# Patient Record
Sex: Female | Born: 1950 | Race: Black or African American | Hispanic: No | Smoking: Never smoker
Health system: Southern US, Community
[De-identification: ages and names within clinical notes are randomized; demographics above are authoritative.]

## PROBLEM LIST (undated history)

## (undated) DIAGNOSIS — T7840XA Allergy, unspecified, initial encounter: Secondary | ICD-10-CM

## (undated) DIAGNOSIS — M1990 Unspecified osteoarthritis, unspecified site: Secondary | ICD-10-CM

## (undated) DIAGNOSIS — I10 Essential (primary) hypertension: Secondary | ICD-10-CM

## (undated) DIAGNOSIS — C801 Malignant (primary) neoplasm, unspecified: Secondary | ICD-10-CM

## (undated) DIAGNOSIS — J329 Chronic sinusitis, unspecified: Secondary | ICD-10-CM

## (undated) DIAGNOSIS — J310 Chronic rhinitis: Secondary | ICD-10-CM

## (undated) HISTORY — DX: Allergy, unspecified, initial encounter: T78.40XA

## (undated) HISTORY — DX: Essential (primary) hypertension: I10

## (undated) HISTORY — DX: Unspecified osteoarthritis, unspecified site: M19.90

## (undated) HISTORY — DX: Chronic rhinitis: J31.0

## (undated) HISTORY — DX: Chronic sinusitis, unspecified: J32.9

## (undated) HISTORY — PX: ABDOMINAL HYSTERECTOMY: SHX81

## (undated) HISTORY — DX: Morbid (severe) obesity due to excess calories: E66.01

## (undated) HISTORY — PX: TUBAL LIGATION: SHX77

## (undated) HISTORY — DX: Malignant (primary) neoplasm, unspecified: C80.1

---

## 1976-02-16 HISTORY — PX: OTHER SURGICAL HISTORY: SHX169

## 1987-02-16 HISTORY — PX: OTHER SURGICAL HISTORY: SHX169

## 2005-02-15 HISTORY — PX: TOTAL KNEE ARTHROPLASTY: SHX125

## 2006-02-22 LAB — HM COLONOSCOPY: HM Colonoscopy: NORMAL

## 2006-05-10 ENCOUNTER — Ambulatory Visit: Payer: Self-pay | Admitting: Family Medicine

## 2006-05-12 ENCOUNTER — Ambulatory Visit (HOSPITAL_COMMUNITY): Admission: RE | Admit: 2006-05-12 | Discharge: 2006-05-12 | Payer: Self-pay | Admitting: Family Medicine

## 2006-05-23 ENCOUNTER — Ambulatory Visit: Admission: RE | Admit: 2006-05-23 | Discharge: 2006-05-23 | Payer: Self-pay | Admitting: Family Medicine

## 2006-06-03 ENCOUNTER — Ambulatory Visit: Payer: Self-pay | Admitting: Pulmonary Disease

## 2006-06-21 ENCOUNTER — Ambulatory Visit: Payer: Self-pay | Admitting: Family Medicine

## 2006-07-20 ENCOUNTER — Encounter: Payer: Self-pay | Admitting: Family Medicine

## 2006-07-20 LAB — CONVERTED CEMR LAB
ALT: 17 units/L (ref 0–35)
AST: 19 units/L (ref 0–37)
Albumin: 4.3 g/dL (ref 3.5–5.2)
Alkaline Phosphatase: 127 units/L — ABNORMAL HIGH (ref 39–117)
BUN: 12 mg/dL (ref 6–23)
Basophils Absolute: 0 10*3/uL (ref 0.0–0.1)
Basophils Relative: 0 % (ref 0–1)
Bilirubin, Direct: 0.1 mg/dL (ref 0.0–0.3)
Blood Glucose, Fasting: 91 mg/dL
CO2: 27 meq/L (ref 19–32)
Calcium: 9 mg/dL (ref 8.4–10.5)
Chloride: 105 meq/L (ref 96–112)
Cholesterol: 141 mg/dL (ref 0–200)
Creatinine, Ser: 0.74 mg/dL (ref 0.40–1.20)
Eosinophils Absolute: 0.1 10*3/uL (ref 0.0–0.7)
Eosinophils Relative: 1 % (ref 0–5)
Glucose, Bld: 91 mg/dL (ref 70–99)
HCT: 39.9 % (ref 36.0–46.0)
HDL: 38 mg/dL — ABNORMAL LOW (ref >39)
Hemoglobin: 12.7 g/dL (ref 12.0–15.0)
Indirect Bilirubin: 0.4 mg/dL (ref 0.0–0.9)
LDL Cholesterol: 77 mg/dL (ref 0–99)
Lymphocytes Relative: 40 % (ref 12–46)
Lymphs Abs: 2.4 10*3/uL (ref 0.7–3.3)
MCHC: 31.8 g/dL (ref 30.0–36.0)
MCV: 87.9 fL (ref 78.0–100.0)
Monocytes Absolute: 0.4 10*3/uL (ref 0.2–0.7)
Monocytes Relative: 7 % (ref 3–11)
Neutro Abs: 3.1 10*3/uL (ref 1.7–7.7)
Neutrophils Relative %: 51 % (ref 43–77)
Platelets: 267 10*3/uL (ref 150–400)
Potassium: 4.1 meq/L (ref 3.5–5.3)
RBC count: 4.54 10*6/uL
RBC: 4.54 M/uL (ref 3.87–5.11)
RDW: 14.8 % — ABNORMAL HIGH (ref 11.5–14.0)
Sodium: 140 meq/L (ref 135–145)
TSH: 2.661 microintl units/mL
TSH: 2.661 microintl units/mL (ref 0.350–5.50)
Total Bilirubin: 0.5 mg/dL (ref 0.3–1.2)
Total CHOL/HDL Ratio: 3.7
Total Protein: 7.5 g/dL (ref 6.0–8.3)
Triglycerides: 130 mg/dL (ref <150)
VLDL: 26 mg/dL (ref 0–40)
WBC, blood: 6 10*3/uL
WBC: 6 10*3/uL (ref 4.0–10.5)

## 2006-09-21 ENCOUNTER — Ambulatory Visit: Payer: Self-pay | Admitting: Family Medicine

## 2006-11-23 ENCOUNTER — Ambulatory Visit: Payer: Self-pay | Admitting: Family Medicine

## 2007-01-25 ENCOUNTER — Ambulatory Visit: Payer: Self-pay | Admitting: Family Medicine

## 2007-01-26 ENCOUNTER — Ambulatory Visit (HOSPITAL_COMMUNITY): Admission: RE | Admit: 2007-01-26 | Discharge: 2007-01-26 | Payer: Self-pay | Admitting: Family Medicine

## 2007-02-16 DIAGNOSIS — I10 Essential (primary) hypertension: Secondary | ICD-10-CM

## 2007-02-16 HISTORY — DX: Essential (primary) hypertension: I10

## 2007-02-24 ENCOUNTER — Encounter: Payer: Self-pay | Admitting: Family Medicine

## 2007-03-08 ENCOUNTER — Other Ambulatory Visit: Admission: RE | Admit: 2007-03-08 | Discharge: 2007-03-08 | Payer: Self-pay | Admitting: Family Medicine

## 2007-03-08 ENCOUNTER — Ambulatory Visit: Payer: Self-pay | Admitting: Family Medicine

## 2007-03-08 ENCOUNTER — Encounter: Payer: Self-pay | Admitting: Family Medicine

## 2007-03-08 LAB — CONVERTED CEMR LAB
Candida species: NEGATIVE
Gardnerella vaginalis: NEGATIVE
Pap Smear: NORMAL
Trichomonal Vaginitis: NEGATIVE

## 2007-07-19 ENCOUNTER — Ambulatory Visit: Payer: Self-pay | Admitting: Family Medicine

## 2007-09-13 ENCOUNTER — Ambulatory Visit: Payer: Self-pay | Admitting: Family Medicine

## 2007-09-13 ENCOUNTER — Encounter: Payer: Self-pay | Admitting: Family Medicine

## 2007-09-13 DIAGNOSIS — M159 Polyosteoarthritis, unspecified: Secondary | ICD-10-CM

## 2007-09-13 DIAGNOSIS — I10 Essential (primary) hypertension: Secondary | ICD-10-CM

## 2007-09-29 ENCOUNTER — Encounter: Payer: Self-pay | Admitting: Family Medicine

## 2007-09-29 LAB — CONVERTED CEMR LAB
BUN: 14 mg/dL (ref 6–23)
Basophils Absolute: 0 10*3/uL (ref 0.0–0.1)
Basophils Relative: 0 % (ref 0–1)
CO2: 29 meq/L (ref 19–32)
Calcium: 9.1 mg/dL (ref 8.4–10.5)
Chloride: 102 meq/L (ref 96–112)
Cholesterol: 141 mg/dL (ref 0–200)
Creatinine, Ser: 0.75 mg/dL (ref 0.40–1.20)
Eosinophils Absolute: 0.1 10*3/uL (ref 0.0–0.7)
Eosinophils Relative: 1 % (ref 0–5)
Glucose, Bld: 92 mg/dL (ref 70–99)
HCT: 40.5 % (ref 36.0–46.0)
HDL: 40 mg/dL (ref >39)
Hemoglobin: 12.9 g/dL (ref 12.0–15.0)
LDL Cholesterol: 83 mg/dL (ref 0–99)
Lymphocytes Relative: 32 % (ref 12–46)
Lymphs Abs: 2 10*3/uL (ref 0.7–4.0)
MCHC: 31.9 g/dL (ref 30.0–36.0)
MCV: 86.2 fL (ref 78.0–100.0)
Magnesium: 2 mg/dL (ref 1.5–2.5)
Monocytes Absolute: 0.6 10*3/uL (ref 0.1–1.0)
Monocytes Relative: 10 % (ref 3–12)
Neutro Abs: 3.6 10*3/uL (ref 1.7–7.7)
Neutrophils Relative %: 57 % (ref 43–77)
Platelets: 278 10*3/uL (ref 150–400)
Potassium: 4.3 meq/L (ref 3.5–5.3)
RBC: 4.7 M/uL (ref 3.87–5.11)
RDW: 15.5 % (ref 11.5–15.5)
Sodium: 142 meq/L (ref 135–145)
TSH: 1.975 microintl units/mL (ref 0.350–4.50)
Total CHOL/HDL Ratio: 3.5
Triglycerides: 89 mg/dL (ref <150)
VLDL: 18 mg/dL (ref 0–40)
WBC: 6.3 10*3/uL (ref 4.0–10.5)

## 2007-11-16 ENCOUNTER — Ambulatory Visit: Payer: Self-pay | Admitting: Family Medicine

## 2007-11-16 DIAGNOSIS — R109 Unspecified abdominal pain: Secondary | ICD-10-CM | POA: Insufficient documentation

## 2007-11-21 ENCOUNTER — Ambulatory Visit (HOSPITAL_COMMUNITY): Admission: RE | Admit: 2007-11-21 | Discharge: 2007-11-21 | Payer: Self-pay | Admitting: Family Medicine

## 2008-02-05 ENCOUNTER — Encounter: Payer: Self-pay | Admitting: Family Medicine

## 2008-07-31 ENCOUNTER — Telehealth (INDEPENDENT_AMBULATORY_CARE_PROVIDER_SITE_OTHER): Payer: Self-pay | Admitting: *Deleted

## 2008-08-01 ENCOUNTER — Encounter: Payer: Self-pay | Admitting: Family Medicine

## 2008-08-08 ENCOUNTER — Ambulatory Visit: Payer: Self-pay | Admitting: Family Medicine

## 2008-08-13 ENCOUNTER — Ambulatory Visit (HOSPITAL_COMMUNITY): Admission: RE | Admit: 2008-08-13 | Discharge: 2008-08-13 | Payer: Self-pay | Admitting: Family Medicine

## 2008-08-18 DIAGNOSIS — J309 Allergic rhinitis, unspecified: Secondary | ICD-10-CM | POA: Insufficient documentation

## 2008-09-13 ENCOUNTER — Telehealth: Payer: Self-pay | Admitting: Family Medicine

## 2008-09-13 ENCOUNTER — Ambulatory Visit: Payer: Self-pay | Admitting: Family Medicine

## 2008-09-13 DIAGNOSIS — M25569 Pain in unspecified knee: Secondary | ICD-10-CM

## 2008-09-13 HISTORY — DX: Pain in unspecified knee: M25.569

## 2008-09-13 LAB — CONVERTED CEMR LAB
BUN: 18 mg/dL (ref 6–23)
Basophils Absolute: 0 10*3/uL (ref 0.0–0.1)
Basophils Relative: 0 % (ref 0–1)
CO2: 28 meq/L (ref 19–32)
Calcium: 9.3 mg/dL (ref 8.4–10.5)
Chloride: 102 meq/L (ref 96–112)
Cholesterol: 146 mg/dL (ref 0–200)
Creatinine, Ser: 0.81 mg/dL (ref 0.40–1.20)
Eosinophils Absolute: 0.1 10*3/uL (ref 0.0–0.7)
Eosinophils Relative: 1 % (ref 0–5)
Glucose, Bld: 103 mg/dL — ABNORMAL HIGH (ref 70–99)
HCT: 41.1 % (ref 36.0–46.0)
HDL: 36 mg/dL — ABNORMAL LOW (ref >39)
Hemoglobin: 13 g/dL (ref 12.0–15.0)
LDL Cholesterol: 80 mg/dL (ref 0–99)
Lymphocytes Relative: 36 % (ref 12–46)
Lymphs Abs: 2.7 10*3/uL (ref 0.7–4.0)
MCHC: 31.6 g/dL (ref 30.0–36.0)
MCV: 88.2 fL (ref 78.0–100.0)
Monocytes Absolute: 0.6 10*3/uL (ref 0.1–1.0)
Monocytes Relative: 8 % (ref 3–12)
Neutro Abs: 4.1 10*3/uL (ref 1.7–7.7)
Neutrophils Relative %: 55 % (ref 43–77)
Platelets: 266 10*3/uL (ref 150–400)
Potassium: 4.2 meq/L (ref 3.5–5.3)
RBC: 4.66 M/uL (ref 3.87–5.11)
RDW: 14.6 % (ref 11.5–15.5)
Sodium: 142 meq/L (ref 135–145)
TSH: 2.278 microintl units/mL (ref 0.350–4.500)
Total CHOL/HDL Ratio: 4.1
Triglycerides: 148 mg/dL (ref <150)
VLDL: 30 mg/dL (ref 0–40)
WBC: 7.4 10*3/uL (ref 4.0–10.5)

## 2008-10-15 ENCOUNTER — Telehealth: Payer: Self-pay | Admitting: Family Medicine

## 2008-11-13 ENCOUNTER — Other Ambulatory Visit: Admission: RE | Admit: 2008-11-13 | Discharge: 2008-11-13 | Payer: Self-pay | Admitting: Family Medicine

## 2008-11-13 ENCOUNTER — Ambulatory Visit: Payer: Self-pay | Admitting: Family Medicine

## 2008-11-13 ENCOUNTER — Encounter: Payer: Self-pay | Admitting: Family Medicine

## 2008-11-13 DIAGNOSIS — H547 Unspecified visual loss: Secondary | ICD-10-CM | POA: Insufficient documentation

## 2008-11-13 LAB — CONVERTED CEMR LAB: OCCULT 1: NEGATIVE

## 2008-11-18 ENCOUNTER — Ambulatory Visit (HOSPITAL_COMMUNITY): Admission: RE | Admit: 2008-11-18 | Discharge: 2008-11-18 | Payer: Self-pay | Admitting: Family Medicine

## 2009-01-17 ENCOUNTER — Telehealth: Payer: Self-pay | Admitting: Family Medicine

## 2009-01-17 ENCOUNTER — Ambulatory Visit: Payer: Self-pay | Admitting: Family Medicine

## 2009-02-13 ENCOUNTER — Ambulatory Visit: Payer: Self-pay | Admitting: Family Medicine

## 2009-02-18 ENCOUNTER — Telehealth: Payer: Self-pay | Admitting: Family Medicine

## 2009-02-20 ENCOUNTER — Telehealth: Payer: Self-pay | Admitting: Family Medicine

## 2009-04-25 ENCOUNTER — Ambulatory Visit: Payer: Self-pay | Admitting: Family Medicine

## 2009-04-28 LAB — CONVERTED CEMR LAB
BUN: 14 mg/dL (ref 6–23)
CO2: 29 meq/L (ref 19–32)
Calcium: 9.4 mg/dL (ref 8.4–10.5)
Chloride: 100 meq/L (ref 96–112)
Creatinine, Ser: 0.81 mg/dL (ref 0.40–1.20)
Glucose, Bld: 77 mg/dL (ref 70–99)
Hgb A1c MFr Bld: 6.1 % (ref 4.6–6.1)
Potassium: 4.4 meq/L (ref 3.5–5.3)
Sodium: 138 meq/L (ref 135–145)
Vit D, 25-Hydroxy: 43 ng/mL (ref 30–89)

## 2009-05-20 ENCOUNTER — Ambulatory Visit: Payer: Self-pay | Admitting: Family Medicine

## 2009-05-20 DIAGNOSIS — R5383 Other fatigue: Secondary | ICD-10-CM

## 2009-05-20 DIAGNOSIS — N3000 Acute cystitis without hematuria: Secondary | ICD-10-CM

## 2009-05-20 DIAGNOSIS — R5381 Other malaise: Secondary | ICD-10-CM

## 2009-05-20 LAB — CONVERTED CEMR LAB
Basophils Absolute: 0 10*3/uL (ref 0.0–0.1)
Basophils Relative: 0 % (ref 0–1)
Bilirubin Urine: NEGATIVE
Eosinophils Absolute: 0.1 10*3/uL (ref 0.0–0.7)
Eosinophils Relative: 1 % (ref 0–5)
Glucose, Urine, Semiquant: NEGATIVE
HCT: 39.8 % (ref 36.0–46.0)
Hemoglobin: 12.5 g/dL (ref 12.0–15.0)
Ketones, urine, test strip: NEGATIVE
Lymphocytes Relative: 31 % (ref 12–46)
Lymphs Abs: 2.9 10*3/uL (ref 0.7–4.0)
MCHC: 31.4 g/dL (ref 30.0–36.0)
MCV: 88.1 fL (ref 78.0–100.0)
Monocytes Absolute: 0.6 10*3/uL (ref 0.1–1.0)
Monocytes Relative: 6 % (ref 3–12)
Neutro Abs: 5.8 10*3/uL (ref 1.7–7.7)
Neutrophils Relative %: 62 % (ref 43–77)
Nitrite: NEGATIVE
Platelets: 294 10*3/uL (ref 150–400)
Protein, U semiquant: NEGATIVE
RBC: 4.52 M/uL (ref 3.87–5.11)
RDW: 14.6 % (ref 11.5–15.5)
Specific Gravity, Urine: 1.02
Urobilinogen, UA: 0.2
WBC: 9.3 10*3/uL (ref 4.0–10.5)
pH: 5.5

## 2009-05-21 ENCOUNTER — Encounter: Payer: Self-pay | Admitting: Family Medicine

## 2009-08-15 ENCOUNTER — Telehealth: Payer: Self-pay | Admitting: Family Medicine

## 2009-09-01 ENCOUNTER — Ambulatory Visit: Payer: Self-pay | Admitting: Family Medicine

## 2009-09-01 DIAGNOSIS — R7301 Impaired fasting glucose: Secondary | ICD-10-CM | POA: Insufficient documentation

## 2009-09-02 LAB — CONVERTED CEMR LAB
BUN: 14 mg/dL (ref 6–23)
CO2: 29 meq/L (ref 19–32)
Calcium: 9.2 mg/dL (ref 8.4–10.5)
Chloride: 102 meq/L (ref 96–112)
Creatinine, Ser: 0.87 mg/dL (ref 0.40–1.20)
Glucose, Bld: 85 mg/dL (ref 70–99)
Hgb A1c MFr Bld: 6.1 % — ABNORMAL HIGH (ref <5.7)
Potassium: 4 meq/L (ref 3.5–5.3)
Sodium: 141 meq/L (ref 135–145)
TSH: 1.245 microintl units/mL (ref 0.350–4.500)

## 2009-12-02 ENCOUNTER — Ambulatory Visit (HOSPITAL_COMMUNITY): Admission: RE | Admit: 2009-12-02 | Discharge: 2009-12-02 | Payer: Self-pay | Admitting: Anesthesiology

## 2010-01-05 ENCOUNTER — Ambulatory Visit: Payer: Self-pay | Admitting: Family Medicine

## 2010-01-05 DIAGNOSIS — R1011 Right upper quadrant pain: Secondary | ICD-10-CM

## 2010-01-09 ENCOUNTER — Ambulatory Visit (HOSPITAL_COMMUNITY): Admission: RE | Admit: 2010-01-09 | Discharge: 2010-01-09 | Payer: Self-pay | Admitting: Family Medicine

## 2010-01-12 LAB — CONVERTED CEMR LAB: Hgb A1c MFr Bld: 6.1 % — ABNORMAL HIGH (ref <5.7)

## 2010-01-13 ENCOUNTER — Encounter (HOSPITAL_COMMUNITY)
Admission: RE | Admit: 2010-01-13 | Discharge: 2010-02-12 | Payer: Self-pay | Source: Home / Self Care | Attending: Family Medicine | Admitting: Family Medicine

## 2010-02-11 ENCOUNTER — Telehealth: Payer: Self-pay | Admitting: Family Medicine

## 2010-02-11 ENCOUNTER — Encounter: Payer: Self-pay | Admitting: Family Medicine

## 2010-03-08 ENCOUNTER — Encounter: Payer: Self-pay | Admitting: Family Medicine

## 2010-03-09 ENCOUNTER — Encounter: Payer: Self-pay | Admitting: Family Medicine

## 2010-03-17 NOTE — Progress Notes (Signed)
Summary: EXCERISES  Phone Note Call from Patient   Summary of Call: DOING WATER AER AND EX CLASS LOW IMPACT WITH [employer] AND SWIMMING LESSONS    WANTS YOU KNOW FEEL GOOD  Initial call taken by: Lind Guest,  August 15, 2009 10:38 AM  Follow-up for Phone Call        great! Follow-up by: Syliva Overman MD,  August 15, 2009 11:06 AM

## 2010-03-17 NOTE — Assessment & Plan Note (Signed)
Summary: F UP   Vital Signs:  Patient profile:   60 year old female Menstrual status:  hysterectomy Height:      67 inches Weight:      323 pounds BMI:     50.77 O2 Sat:      98 % on Room air Pulse rate:   80 / minute Pulse rhythm:   regular Resp:     16 per minute BP sitting:   122 / 70  (left arm)  Vitals Entered By: Adella Hare LPN (January 05, 2010 1:09 PM)  Nutrition Counseling: Patient's BMI is greater than 25 and therefore counseled on weight management options.  O2 Flow:  Room air CC: follow up- Is Patient Diabetic? No Comments complains of some right leg and knee pain   Primary Care Provider:  Syliva Overman MD  CC:  follow up-.  History of Present Illness: Reports  that sh has been doingwell she has done exceptionally well with the apetite suppresant and denies any adverse side effects. Denies recent fever or chills. Denies sinus pressure, nasal congestion , ear pain or sore throat. Denies chest congestion, or cough productive of sputum. Denies chest pain, palpitations, PND, orthopnea or leg swelling. Denies  vomitting, diarrhea or constipation.reports intermittent RUQ pain with nausea and bloating Denies change in bowel movements or bloody stool. Denies dysuria , frequency, incontinence or hesitancy.  Denies headaches, vertigo, seizures. Denies depression, anxiety or insomnia. Denies  rash, lesions, or itch.     Current Medications (verified): 1)  Oscal 500/200 D-3 500-200 Mg-Unit  Tabs (Calcium-Vitamin D) .... Take One Tablet By Mouth Three Times A Day 2)  Aspirin 81 Mg  Tbec (Aspirin) .... Take One Tablet By Mouth Once A Day 3)  Potassium Chloride Crys Cr 20 Meq  Tbcr (Potassium Chloride Crys Cr) .... Take One Tablet By Mouth Once A Day 4)  Lasix 40 Mg  Tabs (Furosemide) .... Take One Tablet By Mouth Once A Day 5)  Loratadine 10 Mg  Tabs (Loratadine) .... One Tab By Mouth Once Daily 6)  Triamterene-Hctz 37.5-25 Mg  Tabs (Triamterene-Hctz) .... One  Tab By Mouth Once Daily 7)  Flax Seed Oil 1000 Mg Caps (Flaxseed (Linseed)) .... Take 1 Tablet By Mouth Once A Day 8)  Cod Liver Oil  Caps (Cod Liver Oil) .... Take 1 Tablet By Mouth Once A Day 9)  Phentermine Hcl 37.5 Mg Tabs (Phentermine Hcl) .... Take 1 Tablet By Mouth Once A Day 10)  Fish Oil 1200 Mg Caps (Omega-3 Fatty Acids) .... Two Cap By Mouth Once Daily 11)  Multivitamins  Tabs (Multiple Vitamin) .... One Tab By Mouth Once Daily 12)  Glucosamine 1500 Complex  Caps (Glucosamine-Chondroit-Vit C-Mn) .... One Cap By Mouth Two Times A Day  Allergies (verified): 1)  ! * Oxycodone  Review of Systems      See HPI Eyes:  Denies discharge, eye pain, and red eye. GI:  Complains of abdominal pain; sharp RUQ pain when she bent over approx 3 months ago, questions gallbladder disease. MS:  Complains of joint pain; left shoulder joint pain 2 months ago , was lifting 5 pound weights, she stopped about 1 month ago and thre is improvement .  Physical Exam  General:  Well-developed,obese,in no acute distress; alert,appropriate and cooperative throughout examination HEENT: No facial asymmetry,  EOMI, No sinus tenderness, TM's Clear, oropharynx  pink and moist.   Chest: Clear to auscultation bilaterally.  CVS: S1, S2, No murmurs, No S3.   Abd:  Soft,RUQ tenderness, no guarding or rebound MS: Adequate ROM spine,and  hips, decreased left shoulder motion and reduced in  knees.  Ext: No edema.   CNS: CN 2-12 intact, power tone and sensation normal throughout.   Skin: Intact, no visible lesions or rashes.  Psych: Good eye contact, normal affect.  Memory intact, not anxious or depressed appearing.    Impression & Recommendations:  Problem # 1:  RUQ PAIN (ICD-789.01) Assessment Comment Only  Orders: Radiology Referral (Radiology) Radiology Referral (Radiology)  Problem # 2:  KNEE PAIN, RIGHT (ICD-719.46) Assessment: Improved  Her updated medication list for this problem includes:    Aspirin  81 Mg Tbec (Aspirin) .Marland Kitchen... Take one tablet by mouth once a day  Problem # 3:  MORBID OBESITY (ICD-278.01) Assessment: Improved  Orders: T-Lipid Profile ([id number])  Ht: 67 (01/05/2010)   Wt: 323 (01/05/2010)   BMI: 50.77 (01/05/2010)  Problem # 4:  UNSPECIFIED ESSENTIAL HYPERTENSION (ICD-401.9) Assessment: Unchanged  Her updated medication list for this problem includes:    Lasix 40 Mg Tabs (Furosemide) .Marland Kitchen... Take one tablet by mouth once a day    Triamterene-hctz 37.5-25 Mg Tabs (Triamterene-hctz) ..... One tab by mouth once daily  Orders: T-Basic Metabolic Panel [id number])  BP today: 122/70 Prior BP: 120/82 (09/01/2009)  Labs Reviewed: K+: 4.0 (09/01/2009) Creat: : 0.87 (09/01/2009)   Chol: 146 (09/13/2008)   HDL: 36 (09/13/2008)   LDL: 80 (09/13/2008)   TG: 148 (09/13/2008)  Complete Medication List: 1)  Oscal 500/200 D-3 500-200 Mg-unit Tabs (Calcium-vitamin d) .... Take one tablet by mouth three times a day 2)  Aspirin 81 Mg Tbec (Aspirin) .... Take one tablet by mouth once a day 3)  Potassium Chloride Crys Cr 20 Meq Tbcr (Potassium chloride crys cr) .... Take one tablet by mouth once a day 4)  Lasix 40 Mg Tabs (Furosemide) .... Take one tablet by mouth once a day 5)  Loratadine 10 Mg Tabs (Loratadine) .... One tab by mouth once daily 6)  Triamterene-hctz 37.5-25 Mg Tabs (Triamterene-hctz) .... One tab by mouth once daily 7)  Flax Seed Oil 1000 Mg Caps (Flaxseed (linseed)) .... Take 1 tablet by mouth once a day 8)  Cod Liver Oil Caps (Cod liver oil) .... Take 1 tablet by mouth once a day 9)  Phentermine Hcl 37.5 Mg Tabs (Phentermine hcl) .... Take 1 tablet by mouth once a day 10)  Fish Oil 1200 Mg Caps (Omega-3 fatty acids) .... Two cap by mouth once daily 11)  Multivitamins Tabs (Multiple vitamin) .... One tab by mouth once daily 12)  Glucosamine 1500 Complex Caps (Glucosamine-chondroit-vit c-mn) .... One cap by mouth two times a day  Other Orders: T- Hemoglobin  A1C ([id number]) T- Hemoglobin A1C (70623-76283)  Patient Instructions: 1)  Please schedule a follow-up appointment in 4 months. 2)  It is important that you exercise regularly at least 20 minutes 5 times a week. If you develop chest pain, have severe difficulty breathing, or feel very tired , stop exercising immediately and seek medical attention. 3)  You need to lose weight. Consider a lower calorie diet and regular exercise. Congrats on weigh loss, excellent, keep it up. 4)  HBA1C  to day 5)  Lipid Panel prior to visit, ICD-9: 6)  BMP prior to visit, ICD-9: 7)  HBA1C  in 4 months 8)  You are referred for an [place] of your abdomen. 9)  Pls call back if shoulder worsens for x ray  Prescriptions: PHENTERMINE HCL 37.5 MG TABS (  PHENTERMINE HCL) Take 1 tablet by mouth once a day  #30 x 1   Entered by:   Adella Hare LPN   Authorized by:   Syliva Overman MD   Signed by:   Adella Hare LPN on 56/21/3086   Method used:   Printed then faxed to ...       [facility] # [phone]* (retail)       [address]       [place], [place]  [address]       Ph: [phone]       Fax: [phone]   RxID:   [id number] TRIAMTERENE-HCTZ 37.5-25 MG  TABS (TRIAMTERENE-HCTZ) one tab by mouth once daily  #90 x 0   Entered by:   Adella Hare LPN   Authorized by:   Syliva Overman MD   Signed by:   Adella Hare LPN on 43/32/9518   Method used:   Faxed to ...       [facility]order)             , [place]         Ph:        Fax: [phone]   RxID:   [id number] LORATADINE 10 MG  TABS (LORATADINE) one tab by mouth once daily  #90 x 0   Entered by:   Adella Hare LPN   Authorized by:   Syliva Overman MD   Signed by:   Adella Hare LPN on 54/27/0623   Method used:   Faxed to ...       [facility] (mail-order)             , [place]         Ph:        Fax: [phone]   RxID:   [id number]    Orders Added: 1)  Est. Patient  Level IV [62703] 2)  T- Hemoglobin A1C [83036-23375] 3)  T-Basic Metabolic Panel [[id number]] 4)  T-Lipid Profile [80061-22930] 5)  T- Hemoglobin A1C [83036-23375] 6)  Radiology Referral [Radiology] 7)  Radiology Referral [Radiology]

## 2010-03-17 NOTE — Assessment & Plan Note (Signed)
Summary: F UP   Vital Signs:  Patient profile:   60 year old female Menstrual status:  hysterectomy Height:      67 inches Weight:      350.50 pounds BMI:     55.09 O2 Sat:      97 % on Room air Pulse rate:   83 / minute Pulse rhythm:   regular Resp:     16 per minute BP sitting:   112 / 70  (left arm)  Vitals Entered By: Adella Hare LPN (April 25, 2009 10:42 AM)  Nutrition Counseling: Patient's BMI is greater than 25 and therefore counseled on weight management options.  O2 Flow:  Room air CC: follow-up visit   Primary Care Provider:  Syliva Overman MD  CC:  follow-up visit.  History of Present Illness: Pt feels she traumatised a tooth in January, currently on penicillin for an abcess, in the mouth. she denies fever , chills or excessive dental pain. Ms Whittier hhas actually gaineed weight on phentermine, staes her eating is indisciplined.She intends to join the seniors group to gert help with nutrtion change and exercise and d/ the phentermine. Reports  that she has otherwise been doing well. Denies recent fever or chills. Denies sinus pressure, nasal congestion , ear pain or sore throat. Denies chest congestion, or cough productive of sputum. Denies chest pain, palpitations, PND, orthopnea or leg swelling. Denies abdominal pain, nausea, vomitting, diarrhea or constipation. Denies change in bowel movements or bloody stool. Denies dysuria , frequency, incontinence or hesitancy. Reports right knee pain , swelling and decreased mobility, was told she needs surgery, but is tryingto lose weight before this. Denies headaches, vertigo, seizures. Denies depression, anxiety or insomnia. Denies  rash, lesions, or itch.      Current Medications (verified): 1)  Oscal 500/200 D-3 500-200 Mg-Unit  Tabs (Calcium-Vitamin D) .... Take One Tablet By Mouth Three Times A Day 2)  Aspirin 81 Mg  Tbec (Aspirin) .... Take One Tablet By Mouth Once A Day 3)  Potassium Chloride Crys Cr  20 Meq  Tbcr (Potassium Chloride Crys Cr) .... Take One Tablet By Mouth Once A Day 4)  Lasix 40 Mg  Tabs (Furosemide) .... Take One Tablet By Mouth Once A Day 5)  Loratadine 10 Mg  Tabs (Loratadine) .... One Tab By Mouth Once Daily 6)  Triamterene-Hctz 37.5-25 Mg  Tabs (Triamterene-Hctz) .... One Tab By Mouth Once Daily 7)  Flax Seed Oil 1000 Mg Caps (Flaxseed (Linseed)) .... Take 1 Tablet By Mouth Once A Day 8)  Cod Liver Oil  Caps (Cod Liver Oil) .... Take 1 Tablet By Mouth Once A Day 9)  Phentermine Hcl 37.5 Mg Tabs (Phentermine Hcl) .... Take 1 Tablet By Mouth Once A Day 10)  Penicillin V Potassium 500 Mg Tabs (Penicillin V Potassium) .... One Tab By Mouth Three Times A Day  Allergies (verified): 1)  ! * Oxycodone  Review of Systems Endo:  Denies cold intolerance, excessive hunger, excessive thirst, excessive urination, heat intolerance, polyuria, and weight change. Heme:  Denies abnormal bruising and bleeding. Allergy:  Complains of seasonal allergies.  Physical Exam  General:  Well-developed,morbidly obese,in no acute distress; alert,appropriate and cooperative throughout examination HEENT: No facial asymmetry,  EOMI, No sinus tenderness, TM's Clear, oropharynx  pink and moist.   Chest: Clear to auscultation bilaterally.  CVS: S1, S2, No murmurs, No S3.   Abd: Soft, Nontender.  [id number]  ROM spine,   and knees.  Ext: No edema.  CNS: CN 2-12 intact, power tone and sensation normal throughout.   Skin: Intact, hyperpigmented macular lesion on ant leg  Psych: Good eye contact, normal affect.  Memory intact, not anxious or depressed appearing.    Impression & Recommendations:  Problem # 1:  KNEE PAIN, RIGHT (ICD-719.46) Assessment Improved  Her updated medication list for this problem includes:    Aspirin 81 Mg Tbec (Aspirin) .Marland Kitchen... Take one tablet by mouth once a day  Problem # 2:  MORBID OBESITY (ICD-278.01) Assessment: Deteriorated  Ht: 67 (04/25/2009)   Wt: 350.50  (04/25/2009)   BMI: 55.09 (04/25/2009)  Problem # 3:  UNSPECIFIED ESSENTIAL HYPERTENSION (ICD-401.9) Assessment: Improved  Her updated medication list for this problem includes:    Lasix 40 Mg Tabs (Furosemide) .Marland Kitchen... Take one tablet by mouth once a day    Triamterene-hctz 37.5-25 Mg Tabs (Triamterene-hctz) ..... One tab by mouth once daily  Orders: T-Basic Metabolic Panel [id number])  BP today: 112/70 Prior BP: 130/82 (02/13/2009)  Labs Reviewed: K+: 4.2 (09/13/2008) Creat: : 0.81 (09/13/2008)   Chol: 146 (09/13/2008)   HDL: 36 (09/13/2008)   LDL: 80 (09/13/2008)   TG: 148 (09/13/2008)  Problem # 4:  ALLERGIC RHINITIS CAUSE UNSPECIFIED (ICD-477.9) Assessment: Unchanged  Her updated medication list for this problem includes:    Loratadine 10 Mg Tabs (Loratadine) ..... One tab by mouth once daily  Complete Medication List: 1)  Oscal 500/200 D-3 500-200 Mg-unit Tabs (Calcium-vitamin d) .... Take one tablet by mouth three times a day 2)  Aspirin 81 Mg Tbec (Aspirin) .... Take one tablet by mouth once a day 3)  Potassium Chloride Crys Cr 20 Meq Tbcr (Potassium chloride crys cr) .... Take one tablet by mouth once a day 4)  Lasix 40 Mg Tabs (Furosemide) .... Take one tablet by mouth once a day 5)  Loratadine 10 Mg Tabs (Loratadine) .... One tab by mouth once daily 6)  Triamterene-hctz 37.5-25 Mg Tabs (Triamterene-hctz) .... One tab by mouth once daily 7)  Flax Seed Oil 1000 Mg Caps (Flaxseed (linseed)) .... Take 1 tablet by mouth once a day 8)  Cod Liver Oil Caps (Cod liver oil) .... Take 1 tablet by mouth once a day 9)  Penicillin V Potassium 500 Mg Tabs (Penicillin v potassium) .... One tab by mouth three times a day  Other Orders: T- Hemoglobin A1C ([id number]) T-Vitamin D (25-Hydroxy) ([id number])  Patient Instructions: 1)  Please schedule a follow-up appointment in 4 months. 2)  You need to lose weight. Consider a lower calorie diet and regular exercise. 3)  Meds will  continue as before except that the phentermine is to be discontinued aas we discussed. 4)  Labs today

## 2010-03-17 NOTE — Progress Notes (Signed)
Summary: diet pills  Phone Note Call from Patient   Summary of Call: went to her drug store and they did not have her diet pills filled needs you to call her [phone] or [phone] Initial call taken by: Lind Guest,  February 18, 2009 12:57 PM  Follow-up for Phone Call        Rx Called In, patient aware Follow-up by: Worthy Keeler LPN,  February 18, 2009 2:21 PM

## 2010-03-17 NOTE — Assessment & Plan Note (Signed)
Summary: UTI   Vital Signs:  Patient profile:   60 year old female Menstrual status:  hysterectomy Height:      67 inches Weight:      350.25 pounds BMI:     55.06 O2 Sat:      97 % Pulse rate:   84 / minute Pulse rhythm:   regular Resp:     16 per minute BP sitting:   120 / 82 Cuff size:   xl  Vitals Entered By: Everitt Amber LPN (May 20, 100 9:54 AM)  Nutrition Counseling: Patient's BMI is greater than 25 and therefore counseled on weight management options. CC: thinks she has a UTI, patient submitting urine for ccua   Primary Care Provider:  Syliva Overman MD  CC:  thinks she has a UTI and patient submitting urine for ccua.  History of Present Illness: 1 week h/o dysuria and frequency, had chiills 3 days ago and has had some mild right flank pain. she realises thast her weight loss is not happening without the help of apetite suppressant which is working well for her sister, Dina Rich wants to resume this. Shedenies depression, or anxiety.  Current Medications (verified): 1)  Oscal 500/200 D-3 500-200 Mg-Unit  Tabs (Calcium-Vitamin D) .... Take One Tablet By Mouth Three Times A Day 2)  Aspirin 81 Mg  Tbec (Aspirin) .... Take One Tablet By Mouth Once A Day 3)  Potassium Chloride Crys Cr 20 Meq  Tbcr (Potassium Chloride Crys Cr) .... Take One Tablet By Mouth Once A Day 4)  Lasix 40 Mg  Tabs (Furosemide) .... Take One Tablet By Mouth Once A Day 5)  Loratadine 10 Mg  Tabs (Loratadine) .... One Tab By Mouth Once Daily 6)  Triamterene-Hctz 37.5-25 Mg  Tabs (Triamterene-Hctz) .... One Tab By Mouth Once Daily 7)  Flax Seed Oil 1000 Mg Caps (Flaxseed (Linseed)) .... Take 1 Tablet By Mouth Once A Day 8)  Cod Liver Oil  Caps (Cod Liver Oil) .... Take 1 Tablet By Mouth Once A Day  Allergies (verified): 1)  ! * Oxycodone  Review of Systems      See HPI General:  Complains of chills and fatigue; denies sweats and weakness. Eyes:  Denies blurring and discharge. ENT:  Denies  hoarseness, nasal congestion, sinus pressure, and sore throat. CV:  Denies chest pain or discomfort, palpitations, and swelling of feet. Resp:  Denies cough and sputum productive. GI:  Denies abdominal pain, constipation, diarrhea, nausea, and vomiting. GU:  See HPI. MS:  Denies joint pain and stiffness. Endo:  Denies cold intolerance, excessive hunger, excessive thirst, excessive urination, heat intolerance, polyuria, and weight change.  Physical Exam  General:  Well-developed,morbidly obese,in no acute distress; alert,appropriate and cooperative throughout examination HEENT: No facial asymmetry,  EOMI, No sinus tenderness, TM's Clear, oropharynx  pink and moist.   Chest: Clear to auscultation bilaterally.  CVS: S1, S2, No murmurs, No S3.   Abd: Soft, right renal angle tenderness. [id number]  ROM spine,   and knees.  Ext: No edema.   CNS: CN 2-12 intact, power tone and sensation normal throughout.   Skin: Intact, hyperpigmented macular lesion on ant leg  Psych: Good eye contact, normal affect.  Memory intact, not anxious or depressed appearing.    Impression & Recommendations:  Problem # 1:  ACUTE CYSTITIS (ICD-595.0) Assessment Comment Only  The following medications were removed from the medication list:    Penicillin V Potassium 500 Mg Tabs (Penicillin v potassium) ..... One  tab by mouth three times a day Her updated medication list for this problem includes:    Ciprofloxacin Hcl 500 Mg Tabs (Ciprofloxacin hcl) .Marland Kitchen... Take 1 tablet by mouth two times a day  Orders: UA Dipstick W/ Micro (manual) (25852) T-Culture, Urine ([id number]) Rocephin  250mg  (R4431) Admin of Therapeutic Inj  intramuscular or subcutaneous (54008)  Encouraged to push clear liquids, get enough rest, and take acetaminophen as needed. To be seen in 10 days if no improvement, sooner if worse.  Problem # 2:  MORBID OBESITY (ICD-278.01) Assessment: Unchanged  Ht: 67 (05/20/2009)   Wt: 350.25  (05/20/2009)   BMI: 55.06 (05/20/2009)  Problem # 3:  UNSPECIFIED ESSENTIAL HYPERTENSION (ICD-401.9) Assessment: Unchanged  Her updated medication list for this problem includes:    Lasix 40 Mg Tabs (Furosemide) .Marland Kitchen... Take one tablet by mouth once a day    Triamterene-hctz 37.5-25 Mg Tabs (Triamterene-hctz) ..... One tab by mouth once daily  BP today: 120/82 Prior BP: 112/70 (04/25/2009)  Labs Reviewed: K+: 4.4 (04/25/2009) Creat: : 0.81 (04/25/2009)   Chol: 146 (09/13/2008)   HDL: 36 (09/13/2008)   LDL: 80 (09/13/2008)   TG: 148 (09/13/2008)  Complete Medication List: 1)  Oscal 500/200 D-3 500-200 Mg-unit Tabs (Calcium-vitamin d) .... Take one tablet by mouth three times a day 2)  Aspirin 81 Mg Tbec (Aspirin) .... Take one tablet by mouth once a day 3)  Potassium Chloride Crys Cr 20 Meq Tbcr (Potassium chloride crys cr) .... Take one tablet by mouth once a day 4)  Lasix 40 Mg Tabs (Furosemide) .... Take one tablet by mouth once a day 5)  Loratadine 10 Mg Tabs (Loratadine) .... One tab by mouth once daily 6)  Triamterene-hctz 37.5-25 Mg Tabs (Triamterene-hctz) .... One tab by mouth once daily 7)  Flax Seed Oil 1000 Mg Caps (Flaxseed (linseed)) .... Take 1 tablet by mouth once a day 8)  Cod Liver Oil Caps (Cod liver oil) .... Take 1 tablet by mouth once a day 9)  Ciprofloxacin Hcl 500 Mg Tabs (Ciprofloxacin hcl) .... Take 1 tablet by mouth two times a day 10)  Fluconazole 150 Mg Tabs (Fluconazole) .... Take 1 tablet by mouth once a day as needed 11)  Phentermine Hcl 37.5 Mg Tabs (Phentermine hcl) .... Take 1 tablet by mouth once a day  Other Orders: T-CBC w/Diff ([id number])  Patient Instructions: 1)  F/U in 2 months 2)   You are being treatef for cystitis and probable pylonephritis. You will get an injection in the office today and meds are being  swent in also. 3)  CBC and diff today Prescriptions: PHENTERMINE HCL 37.5 MG TABS (PHENTERMINE HCL) Take 1 tablet by mouth once a day   #30 x 1   Entered and Authorized by:   Syliva Overman MD   Signed by:   Syliva Overman MD on 05/20/2009   Method used:   Printed then faxed to ...       [facility] # [phone]* (retail)       [address]       [place], [place]  [address]       Ph: [phone]       Fax: [phone]   RxID:   [id number] FLUCONAZOLE 150 MG TABS (FLUCONAZOLE) Take 1 tablet by mouth once a day as needed  #3 x 0   Entered and Authorized by:   Syliva Overman MD   Signed by:   Syliva Overman MD on 05/20/2009   Method used:  Electronically to        [facility] # [phone]* (retail)       [address]       [place], [place]  [address]       Ph: [phone]       Fax: [phone]   RxID:   [id number] CIPROFLOXACIN HCL 500 MG TABS (CIPROFLOXACIN HCL) Take 1 tablet by mouth two times a day  #14 x 0   Entered and Authorized by:   Syliva Overman MD   Signed by:   Syliva Overman MD on 05/20/2009   Method used:   Electronically to        [facility] # [id number]* (retail)       [address]       [place], [place]  [address]       Ph: [phone]       Fax: [phone]   RxID:   [id number]   Laboratory Results   Urine Tests    Routine Urinalysis   Color: lt. yellow Glucose: negative   (Normal Range: Negative) Bilirubin: negative   (Normal Range: Negative) Ketone: negative   (Normal Range: Negative) Spec. Gravity: 1.020   (Normal Range: 1.003-1.035) Blood: large   (Normal Range: Negative) pH: 5.5   (Normal Range: 5.0-8.0) Protein: negative   (Normal Range: Negative) Urobilinogen: 0.2   (Normal Range: 0-1) Nitrite: negative   (Normal Range: Negative) Leukocyte Esterace: large   (Normal Range: Negative)          Medication Administration  Injection # 1:    Medication: Rocephin  250mg     Diagnosis: ACUTE CYSTITIS (ICD-595.0)    Route: IM    Site: RUOQ gluteus    Exp Date: 05/2011    Lot #: [id number]    Mfr: novaplus    Comments: 500mg  given     Patient  tolerated injection without complications    Given by: Everitt Amber LPN (May 21, 1599 11:13 AM)  Orders Added: 1)  UA Dipstick W/ Micro (manual) [81000] 2)  T-Culture, Urine [[id number]] 3)  Est. Patient Level IV [20254] 4)  T-CBC w/Diff [[id number]] 5)  Rocephin  250mg  [J0696] 6)  Admin of Therapeutic Inj  intramuscular or subcutaneous [31517]

## 2010-03-17 NOTE — Progress Notes (Signed)
Summary:  vit  Phone Note Call from Patient   Summary of Call: wants to know if its okay that she is on 2 vitamins. with all other meds. [phone] Initial call taken by: Rudene Anda,  February 20, 2009 4:06 PM  Follow-up for Phone Call        what are the vitamins and why is she on them,  Follow-up by: Syliva Overman MD,  February 20, 2009 5:15 PM  Additional Follow-up for Phone Call Additional follow up Details #1::        complete multivitamin  advised this is okay  asking if okay to take fish oil Additional Follow-up by: Worthy Keeler LPN,  February 21, 2009 9:16 AM    Additional Follow-up for Phone Call Additional follow up Details #2::    yes Follow-up by: Syliva Overman MD,  February 21, 2009 9:43 AM  Additional Follow-up for Phone Call Additional follow up Details #3:: Details for Additional Follow-up Action Taken: patient aware Additional Follow-up by: Worthy Keeler LPN,  February 21, 2009 3:29 PM

## 2010-03-17 NOTE — Progress Notes (Signed)
Summary: refill  Phone Note Call from Patient   Summary of Call: needs her triamterene refilled at [facility] Initial call taken by: Lind Guest,  August 15, 2009 7:54 AM    Prescriptions: TRIAMTERENE-HCTZ 37.5-25 MG  TABS (TRIAMTERENE-HCTZ) one tab by mouth once daily  #30 x 0   Entered by:   Adella Hare LPN   Authorized by:   Syliva Overman MD   Signed by:   Adella Hare LPN on 03/47/4259   Method used:   Electronically to        [facility] # [phone]* (retail)       [address]       [place], [place]  [address]       Ph: [phone]       Fax: [phone]   RxID:   [id number]

## 2010-03-17 NOTE — Assessment & Plan Note (Signed)
Summary: F UP   Vital Signs:  Patient profile:   60 year old female Menstrual status:  hysterectomy Height:      67 inches Weight:      340 pounds BMI:     53.44 O2 Sat:      99 % Pulse rate:   88 / minute Pulse rhythm:   regular Resp:     16 per minute BP sitting:   120 / 82  (left arm) Cuff size:   xl  Vitals Entered By: Everitt Amber LPN (September 01, 2009 3:28 PM)  Nutrition Counseling: Patient's BMI is greater than 25 and therefore counseled on weight management options. CC: Follow up chronic problems, legs hurting her, thinks she's doing too much   Primary Care Provider:  Syliva Overman MD  CC:  Follow up chronic problems, legs hurting her, and thinks she's doing too much.  History of Present Illness: Reports  that she has been doing well. Denies recent fever or chills. Denies sinus pressure, nasal congestion , ear pain or sore throat. Denies chest congestion, or cough productive of sputum. Denies chest pain, palpitations, PND, orthopnea or leg swelling. Denies abdominal pain, nausea, vomitting, diarrhea or constipation. Denies change in bowel movements or bloody stool. Denies dysuria , frequency, incontinence or hesitancy. c/o increased joint pain, and  reduced mobility.affecting both knees, right grtr than left Denies headaches, vertigo, seizures. Denies depression, anxiety or insomnia. Denies  rash, lesions, or itch. She has been taking her exercise seriously, but finds that when she swims, this seems to aggravate her knees excessively, so she has sensibly decided to stop swimming.     Current Medications (verified): 1)  Oscal 500/200 D-3 500-200 Mg-Unit  Tabs (Calcium-Vitamin D) .... Take One Tablet By Mouth Three Times A Day 2)  Aspirin 81 Mg  Tbec (Aspirin) .... Take One Tablet By Mouth Once A Day 3)  Potassium Chloride Crys Cr 20 Meq  Tbcr (Potassium Chloride Crys Cr) .... Take One Tablet By Mouth Once A Day 4)  Lasix 40 Mg  Tabs (Furosemide) .... Take One  Tablet By Mouth Once A Day 5)  Loratadine 10 Mg  Tabs (Loratadine) .... One Tab By Mouth Once Daily 6)  Triamterene-Hctz 37.5-25 Mg  Tabs (Triamterene-Hctz) .... One Tab By Mouth Once Daily 7)  Flax Seed Oil 1000 Mg Caps (Flaxseed (Linseed)) .... Take 1 Tablet By Mouth Once A Day 8)  Cod Liver Oil  Caps (Cod Liver Oil) .... Take 1 Tablet By Mouth Once A Day 9)  Ciprofloxacin Hcl 500 Mg Tabs (Ciprofloxacin Hcl) .... Take 1 Tablet By Mouth Two Times A Day 10)  Fluconazole 150 Mg Tabs (Fluconazole) .... Take 1 Tablet By Mouth Once A Day As Needed 11)  Phentermine Hcl 37.5 Mg Tabs (Phentermine Hcl) .... Take 1 Tablet By Mouth Once A Day  Allergies (verified): 1)  ! * Oxycodone  Review of Systems      See HPI General:  Complains of fatigue. Eyes:  Denies blurring and discharge. MS:  Complains of joint pain; right knee opin x several wweeks. Derm:  Complains of lesion(s); unchanged hyperpigmented lesion on leg. Endo:  Denies cold intolerance, excessive hunger, excessive thirst, excessive urination, heat intolerance, polyuria, and weight change. Heme:  Denies abnormal bruising and bleeding. Allergy:  Complains of seasonal allergies; denies hives or rash and itching eyes.  Physical Exam  General:  Well-developed,morbidly obese,in no acute distress; alert,appropriate and cooperative throughout examination HEENT: No facial asymmetry,  EOMI, No sinus  tenderness, TM's Clear, oropharynx  pink and moist.   Chest: Clear to auscultation bilaterally.  CVS: S1, S2, No murmurs, No S3.   Abd: Soft, NON TENDER UJ:[id number]  ROM spine,   and knees. Right greater thn left  Ext: No edema.   CNS: CN 2-12 intact, power tone and sensation normal throughout.   Skin: Intact, hyperpigmented macular lesion on ant leg  Psych: Good eye contact, normal affect.  Memory intact, not anxious or depressed appearing.    Impression & Recommendations:  Problem # 1:  KNEE PAIN, RIGHT (ICD-719.46) Assessment  Deteriorated  Her updated medication list for this problem includes:    Aspirin 81 Mg Tbec (Aspirin) .Marland Kitchen... 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.21) Assessment: Comment Only  Orders: T- Hemoglobin A1C ([id number]), THE IMPORTANCE OF REDUCING CARB AND SWEET INTAKE WAS STRESSED, TO FACILITATE WEIGHT LOSS AND REDUCE THE RISK OF DEVELOPING TYPE 2 dm  Problem # 3:  UNSPECIFIED ESSENTIAL HYPERTENSION (ICD-401.9) Assessment: Unchanged  BP today: 120/82 Prior BP: 120/82 (05/20/2009)  Labs Reviewed: K+: 4.4 (04/25/2009) Creat: : 0.81 (04/25/2009)   Chol: 146 (09/13/2008)   HDL: 36 (09/13/2008)   LDL: 80 (09/13/2008)   TG: 148 (09/13/2008)  Orders: T-Basic Metabolic Panel [id number])  Problem # 4:  MORBID OBESITY (ICD-278.01) Assessment: Improved  Ht: 67 (09/01/2009)   Wt: 340 (09/01/2009)   BMI: 53.44 (09/01/2009)  Complete Medication List: 1)  Oscal 500/200 D-3 500-200 Mg-unit Tabs (Calcium-vitamin d) .... Take one tablet by mouth three times a day 2)  Aspirin 81 Mg Tbec (Aspirin) .... Take one tablet by mouth once a day 3)  Potassium Chloride Crys Cr 20 Meq Tbcr (Potassium chloride crys cr) .... Take one tablet by mouth once a day 4)  Lasix 40 Mg Tabs (Furosemide) .... Take one tablet by mouth once a day 5)  Loratadine 10 Mg Tabs (Loratadine) .... One tab by mouth once daily 6)  Triamterene-hctz 37.5-25 Mg Tabs (Triamterene-hctz) .... One tab by mouth once daily 7)  Flax Seed Oil 1000 Mg Caps (Flaxseed (linseed)) .... Take 1 tablet by mouth once a day 8)  Cod Liver Oil Caps (Cod liver oil) .... Take 1 tablet by mouth once a day 9)  Ciprofloxacin Hcl 500 Mg Tabs (Ciprofloxacin hcl) .... Take 1 tablet by mouth two times a day 10)  Fluconazole 150 Mg Tabs (Fluconazole) .... Take 1 tablet by mouth once a day as needed 11)   Phentermine Hcl 37.5 Mg Tabs (Phentermine hcl) .... Take 1 tablet by mouth once a day  Other Orders: T-TSH ([id number])  Patient Instructions: 1)  Please schedule a follow-up appointment in 4 months. 2)  It is important that you exercise regularly at least 20 minutes 5 times a week. If you develop chest pain, have severe difficulty breathing, or feel very tired , stop exercising immediately and seek medical attention. 3)  You need to lose weight. Consider a lower calorie diet and regular exercise.  4)  BMP prior to visit, ICD-9: 5)  TSH prior to visit, ICD-9:  labs today 6)  HbgA1C prior to visit, ICD-9: Prescriptions: PHENTERMINE HCL 37.5 MG TABS (PHENTERMINE HCL) Take 1 tablet by mouth once a day  #30 x 1   Entered and Authorized by:   Syliva Overman MD   Signed by:   Syliva Overman MD on 09/01/2009   Method  used:   Printed then faxed to ...       [facility] # [phone]* (retail)       [address]       [place], [place]  [address]       Ph: [phone]       Fax: [phone]   RxID:   [id number]    Medication Administration  Injection # 1:    Medication: Ketorolac-Toradol 15mg     Diagnosis: KNEE PAIN, RIGHT (ICD-719.46)    Route: IM    Site: RUOQ gluteus    Exp Date: 01/2011    Lot #: [id number]     Mfr: hospira    Comments: 60mg  given   Orders Added: 1)  Est. Patient Level IV [87564] 2)  T-Basic Metabolic Panel [[id number]] 3)  T-TSH [[id number]] 4)  T- Hemoglobin A1C [83036-23375] 5)  Admin of Therapeutic Inj  intramuscular or subcutaneous [96372] 6)  Ketorolac-Toradol 15mg  [J1885]

## 2010-03-19 NOTE — Medication Information (Signed)
Summary: Tax adviser   Imported By: Lind Guest 02/11/2010 16:58:03  _____________________________________________________________________  External Attachment:    Type:   Image     Comment:   External Document

## 2010-03-19 NOTE — Progress Notes (Signed)
Summary: rx  Phone Note Call from Patient   Summary of Call: [facility] is going to fax over for her potas. medicine because [facility] will not [facility] it until feb.  so be on the outlook for the rx Initial call taken by: Lind Guest,  February 11, 2010 10:16 AM    Prescriptions: POTASSIUM CHLORIDE CRYS CR 20 MEQ  TBCR (POTASSIUM CHLORIDE CRYS CR) Take one tablet by mouth once a day  #90 x 0   Entered by:   Adella Hare LPN   Authorized by:   Syliva Overman MD   Signed by:   Adella Hare LPN on 81/19/1478   Method used:   Electronically to        [facility] # [phone]* (retail)       [address]       [place], [place]  [address]       Ph: [phone]       Fax: [phone]   RxID:   [id number]

## 2010-03-24 ENCOUNTER — Encounter: Payer: Self-pay | Admitting: Family Medicine

## 2010-04-02 NOTE — Letter (Signed)
Summary: [facility]   Imported By: Lind Guest 03/24/2010 14:34:55  _____________________________________________________________________  External Attachment:    Type:   Image     Comment:   External Document

## 2010-05-04 ENCOUNTER — Encounter: Payer: Self-pay | Admitting: Family Medicine

## 2010-05-04 LAB — CONVERTED CEMR LAB
Chloride: 100 meq/L (ref 96–112)
Cholesterol: 157 mg/dL (ref 0–200)
Creatinine, Ser: 0.83 mg/dL (ref 0.40–1.20)
HDL: 39 mg/dL — ABNORMAL LOW (ref >39)
LDL Cholesterol: 100 mg/dL — ABNORMAL HIGH (ref 0–99)
Potassium: 4.1 meq/L (ref 3.5–5.3)
Triglycerides: 92 mg/dL (ref <150)
VLDL: 18 mg/dL (ref 0–40)

## 2010-05-05 ENCOUNTER — Telehealth: Payer: Self-pay | Admitting: Family Medicine

## 2010-05-05 ENCOUNTER — Emergency Department (HOSPITAL_COMMUNITY)
Admission: EM | Admit: 2010-05-05 | Discharge: 2010-05-05 | Disposition: A | Discharge status: Home / Self Care | Payer: Medicare PPO | Attending: Emergency Medicine | Admitting: Emergency Medicine

## 2010-05-05 ENCOUNTER — Emergency Department (HOSPITAL_COMMUNITY): Payer: Medicare PPO

## 2010-05-05 DIAGNOSIS — I8000 Phlebitis and thrombophlebitis of superficial vessels of unspecified lower extremity: Secondary | ICD-10-CM | POA: Insufficient documentation

## 2010-05-05 DIAGNOSIS — M7989 Other specified soft tissue disorders: Secondary | ICD-10-CM | POA: Insufficient documentation

## 2010-05-05 DIAGNOSIS — I10 Essential (primary) hypertension: Secondary | ICD-10-CM | POA: Insufficient documentation

## 2010-05-05 DIAGNOSIS — Z79899 Other long term (current) drug therapy: Secondary | ICD-10-CM | POA: Insufficient documentation

## 2010-05-05 DIAGNOSIS — M79609 Pain in unspecified limb: Secondary | ICD-10-CM | POA: Insufficient documentation

## 2010-05-05 LAB — CBC
HCT: 37.8 % (ref 36.0–46.0)
Hemoglobin: 12.4 g/dL (ref 12.0–15.0)
MCH: 28.2 pg (ref 26.0–34.0)
MCHC: 32.8 g/dL (ref 30.0–36.0)
MCV: 85.9 fL (ref 78.0–100.0)
Platelets: 271 10*3/uL (ref 150–400)
RBC: 4.4 MIL/uL (ref 3.87–5.11)
RDW: 14 % (ref 11.5–15.5)
WBC: 6.7 10*3/uL (ref 4.0–10.5)

## 2010-05-05 LAB — BASIC METABOLIC PANEL
BUN: 12 mg/dL (ref 6–23)
CO2: 30 mEq/L (ref 19–32)
Calcium: 9 mg/dL (ref 8.4–10.5)
Chloride: 100 mEq/L (ref 96–112)
Creatinine, Ser: 0.82 mg/dL (ref 0.4–1.2)
GFR calc Af Amer: >60 mL/min (ref >60)
GFR calc non Af Amer: >60 mL/min (ref >60)
Glucose, Bld: 107 mg/dL — ABNORMAL HIGH (ref 70–99)
Potassium: 3.7 mEq/L (ref 3.5–5.1)

## 2010-05-05 LAB — DIFFERENTIAL
Basophils Absolute: 0 10*3/uL (ref 0.0–0.1)
Basophils Relative: 0 % (ref 0–1)
Eosinophils Absolute: 0.1 10*3/uL (ref 0.0–0.7)
Eosinophils Relative: 1 % (ref 0–5)
Lymphocytes Relative: 32 % (ref 12–46)
Lymphs Abs: 2.1 10*3/uL (ref 0.7–4.0)
Monocytes Relative: 8 % (ref 3–12)
Neutro Abs: 4 10*3/uL (ref 1.7–7.7)
Neutrophils Relative %: 60 % (ref 43–77)

## 2010-05-05 NOTE — Telephone Encounter (Signed)
NOTED AND AGREE.

## 2010-05-06 ENCOUNTER — Encounter: Payer: Self-pay | Admitting: Family Medicine

## 2010-05-06 ENCOUNTER — Ambulatory Visit (INDEPENDENT_AMBULATORY_CARE_PROVIDER_SITE_OTHER): Payer: Medicare PPO | Admitting: Family Medicine

## 2010-05-06 VITALS — BP 120/80 | HR 89 | Resp 16 | Ht 67.0 in | Wt 325.0 lb

## 2010-05-06 DIAGNOSIS — R7301 Impaired fasting glucose: Secondary | ICD-10-CM

## 2010-05-06 MED ORDER — PHENTERMINE HCL 37.5 MG PO TABS: 37.5000 mg | ORAL_TABLET | Freq: Every day | ORAL | Status: DC

## 2010-05-06 MED ORDER — FUROSEMIDE 40 MG PO TABS: 40.0000 mg | ORAL_TABLET | Freq: Every day | ORAL | Status: DC

## 2010-05-06 MED ORDER — TRIAMTERENE-HCTZ 37.5-25 MG PO TABS: 1.0000 | ORAL_TABLET | Freq: Every day | ORAL | Status: DC

## 2010-05-06 NOTE — Patient Instructions (Signed)
F/u in 4 months  You need to set a weight loss goal of at least 2 pounds per month, continue the same dose of phentermine.   HBa1C  In 4 months, non fasting

## 2010-05-06 NOTE — Progress Notes (Signed)
  Subjective:    Patient ID: Michele Lowe, female    DOB: 1951-01-28, 60 y.o.   MRN: [id number]  HPI Pt seen in Ed yesterday, dx with cellulitis/phlebitis of left leg, currently on keflex for a total  Of 7 days, already sees improvement. Also seen at the ortho clinic at [facility]  Today, and has been referred on to the lympedema clinic there also She has not been as diligent with diet since her last visit, and unfortunately has gained weight, she plans to address this, and wants to continue the phentermine. Michele Lowe is here for follow up and re-evaluation of chronic medical conditions, medication management and review of recent lab and radiology data.  Preventive health is updated, specifically  Cancer screening, and Immunization.   Questions or concerns regarding consultations or procedures which the PT has had in the interim are  addressed. She  denies any adverse reactions to current medications since the last visit.      Review of Systems Denies recent fever or chills. Denies sinus pressure, nasal congestion, ear pain or sore throat. Denies chest congestion, productive cough or wheezing. Denies chest pains, palpitations, paroxysmal nocturnal dyspnea, orthopnea and leg swelling Denies abdominal pain, nausea, vomiting,diarrhea or constipation.  Denies rectal bleeding or change in bowel movement. Denies dysuria, frequency, hesitancy or incontinence. Chronic  joint pain, swelling and limitation and mobility. Denies headaches, seizure, numbness, or tingling. Denies depression, anxiety or insomnia. 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  Neck supple no adenopathy.  Chest: Clear to auscultation bilaterally.  CVS: S1, S2 no  murmurs, no S3.Mild erythema and tenderness of veins in lower extremity.  ABD: Soft non tender. Bowel sounds normal.  Ext: one plus pitting  edema  MS: decreased  ROM spine,  hips and knees.  Skin: Intact, no ulcerations hyperpigmentation of lower extremity Psych: Good eye contact, normal affect. Memory intact not anxious or depressed appearing.  CNS: CN 2-12 intact, power, tone and sensation normal throughout.        Assessment & Plan:  1. Hypertension: controlled, no change in medication. 2. Obesity: deteriorated, counseled re lifestyle change, esp diet, will resume phentermine. 3.Phlebitis: improving per history. 3. Chronic lymphedema;deteriorated, to be evaluated at specialty clinic

## 2010-05-25 ENCOUNTER — Encounter: Payer: Self-pay | Admitting: Family Medicine

## 2010-07-03 NOTE — Procedures (Signed)
NAME:  Michele Lowe, Michele Lowe NO.:  [id number]   MEDICAL RECORD NO.:  [id number]          PATIENT TYPE:  OUT   LOCATION:  SLEEP LAB                     FACILITY:  [facility]   PHYSICIAN:  Barbaraann Share, MD,FCCPDATE OF BIRTH:  Apr 18[birth year] 1948   DATE OF STUDY:  05/23/2006                            NOCTURNAL POLYSOMNOGRAM   REFERRING PHYSICIAN:  Milus Mallick. Lodema Hong, M.D.   INDICATION FOR THE STUDY:  Unspecified sleep disturbance (780.50).   EPWORTH SCORE:  10.   SLEEP ARCHITECTURE:  The patient had total sleep time of 393 minutes  with decreased slow wave sleep for age and significantly decreased REM  at 46 minutes.  Sleep onset latency was normal as was REM onset.  Sleep  efficiency was decreased at 83%.   RESPIRATORY DATA:  The patient was found to have 48 hypopneas, 19  obstructive apneas and one central apneas for an apnea/hypopnea index of  10 events per hour.  Events were clearly worse during REM and there was  moderate to loud snoring noted throughout.   OXYGEN DATA:  There was O2 desaturation as low as 75%.   CARDIAC DATA:  No clinically significant cardiac arrhythmias were noted.   MOVEMENT/PARASOMNIA:  The patient was found to have 31 leg jerks with  1.7 per hour resulting in arousal or awakening.  This does not appear to  be clinically significant.   IMPRESSION/RECOMMENDATIONS:  Mild obstructive sleep apnea/hypopnea  syndrome with an apnea/hypopnea index of 10 events per hour and O2  desaturation as low as 75% primarily during rapid eye movement.  Treatment for this degree of sleep apnea can include weight loss alone  if applicable, upper airway surgery, oral appliance, and also continuous  positive airway pressure.  Clinical correlation is suggested with  treatment being based on how much this impacts the patient's quality of  life.     Barbaraann Share, MD,FCCP  Diplomate, American Board of Sleep  Medicine  Electronically Signed    [facility]  D:   06/03/2006 17:31:11  T:  06/04/2006 18:54:21  Job:  [id number]

## 2010-08-06 ENCOUNTER — Telehealth: Payer: Self-pay | Admitting: Family Medicine

## 2010-08-06 ENCOUNTER — Other Ambulatory Visit: Payer: Self-pay | Admitting: Family Medicine

## 2010-08-06 NOTE — Telephone Encounter (Signed)
Faxed over order. Patient aware

## 2010-08-07 LAB — HEMOGLOBIN A1C: Mean Plasma Glucose: 126 mg/dL — ABNORMAL HIGH (ref <117)

## 2010-08-31 ENCOUNTER — Encounter: Payer: Self-pay | Admitting: Family Medicine

## 2010-09-07 ENCOUNTER — Ambulatory Visit (INDEPENDENT_AMBULATORY_CARE_PROVIDER_SITE_OTHER): Payer: Medicare PPO | Admitting: Family Medicine

## 2010-09-07 ENCOUNTER — Encounter: Payer: Self-pay | Admitting: Family Medicine

## 2010-09-07 VITALS — BP 120/80 | HR 81 | Resp 16 | Ht 67.0 in | Wt 324.8 lb

## 2010-09-07 DIAGNOSIS — M159 Polyosteoarthritis, unspecified: Secondary | ICD-10-CM

## 2010-09-07 DIAGNOSIS — R7301 Impaired fasting glucose: Secondary | ICD-10-CM

## 2010-09-07 DIAGNOSIS — I10 Essential (primary) hypertension: Secondary | ICD-10-CM

## 2010-09-07 MED ORDER — FUROSEMIDE 40 MG PO TABS: 40.0000 mg | ORAL_TABLET | Freq: Every day | ORAL | Status: DC

## 2010-09-07 MED ORDER — POTASSIUM CHLORIDE CRYS ER 20 MEQ PO TBCR: 20.0000 meq | EXTENDED_RELEASE_TABLET | Freq: Every day | ORAL | Status: DC

## 2010-09-07 MED ORDER — LORATADINE 10 MG PO TABS: 10.0000 mg | ORAL_TABLET | Freq: Every day | ORAL | Status: DC

## 2010-09-07 NOTE — Patient Instructions (Addendum)
F/u in 2 months.  It is important that you exercise regularly at least 30 minutes 5 times a week. If you develop chest pain, have severe difficulty breathing, or feel very tired, stop exercising immediately and seek medical attention    A healthy diet is rich in fruit, vegetables and whole grains. Poultry fish, nuts and beans are a healthy choice for protein rather then red meat. A low sodium diet and drinking 64 ounces of water daily is generally recommended. Oils and sweet should be limited. Carbohydrates especially for those who are diabetic or overweight, should be limited to 34-45 gram per meal. It is important to eat on a regular schedule, at least 3 times daily. Snacks should be primarily fruits, vegetables or nuts.   HBA1C  and   chem 7    Write down  your food intake and eat at least 5 meals a day, last at 7pm, water  64 ounces per day  We will refer you for individual counselling  Pls do not exceed 1500 cals daily  Weight loss goal of at least 6 pounds

## 2010-09-13 NOTE — Assessment & Plan Note (Signed)
Counseled extensively re th need to reduce carb intake , exercise regularly and lose weight to prevent onset of diabetes

## 2010-09-13 NOTE — Assessment & Plan Note (Signed)
Stable and controlled, no current flare of pain

## 2010-09-13 NOTE — Assessment & Plan Note (Signed)
Deteriorated. Patient re-educated about  the importance of commitment to a  minimum of 150 minutes of exercise per week. The importance of healthy food choices with portion control discussed. Encouraged to start a food diary, count calories and to consider  joining a support group. Sample diet sheets offered. Goals set by the patient for the next several months.    

## 2010-09-13 NOTE — Progress Notes (Signed)
  Subjective:    Patient ID: Michele Lowe, female    DOB: 1950/09/24, 60 y.o.   MRN: [id number]  HPI The PT is here for follow up and re-evaluation of chronic medical conditions, medication management and review of any available recent lab and radiology data.  Preventive health is updated, specifically  Cancer screening and Immunization.   Questions or concerns regarding consultations or procedures which the PT has had in the interim are  addressed. The PT denies any adverse reactions to current medications since the last visit.  There are no new concerns, she is disappointed with her failure to lose weight and intends to work more consistently at this There are no specific complaints       Review of Systems Denies recent fever or chills. Denies sinus pressure, nasal congestion, ear pain or sore throat. Denies chest congestion, productive cough or wheezing. Denies chest pains, palpitations and leg swelling Denies abdominal pain, nausea, vomiting,diarrhea or constipation.   Denies dysuria, frequency, hesitancy or incontinence. Chronic knee pain , unchanged Denies headaches, seizures, numbness, or tingling. Denies depression, anxiety or insomnia. Denies skin break down, has been seen recently at the vascular clinic at [facility], where she is followed for stasis dermatitis        Objective:   Physical Exam Patient alert and oriented and in no cardiopulmonary distress.  HEENT: No facial asymmetry, EOMI, no sinus tenderness,  oropharynx pink and moist.  Neck supple no adenopathy.  Chest: Clear to auscultation bilaterally.  CVS: S1, S2 no murmurs, no S3.  ABD: Soft non tender. Bowel sounds normal.  Ext: No edema  MS: Adequate ROM spine, shoulders, hips and reduced in  knees.  Skin: Intact, no ulcerations noted.  Psych: Good eye contact, normal affect. Memory intact not anxious or depressed appearing.  CNS: CN 2-12 intact, power, tone and sensation normal  throughout.        Assessment & Plan:   MORBID OBESITY Deteriorated. Patient re-educated about  the importance of commitment to a  minimum of 150 minutes of exercise per week. The importance of healthy food choices with portion control discussed. Encouraged to start a food diary, count calories and to consider  joining a support group. Sample diet sheets offered. Goals set by the patient for the next several months.     UNSPECIFIED ESSENTIAL HYPERTENSION Controlled, no change in medication   IMPAIRED FASTING GLUCOSE Counseled extensively re th need to reduce carb intake , exercise regularly and lose weight to prevent onset of diabetes  GENERALIZED OSTEOARTHROSIS UNSPECIFIED SITE Stable and controlled, no current flare of pain

## 2010-09-13 NOTE — Assessment & Plan Note (Signed)
Controlled, no change in medication  

## 2010-09-29 ENCOUNTER — Telehealth: Payer: Self-pay | Admitting: Family Medicine

## 2010-09-29 MED ORDER — PHENTERMINE HCL 37.5 MG PO TABS: 37.5000 mg | ORAL_TABLET | Freq: Every day | ORAL | Status: DC

## 2010-09-29 NOTE — Telephone Encounter (Signed)
Printed for dr to sign

## 2010-11-06 ENCOUNTER — Encounter: Payer: Self-pay | Admitting: Family Medicine

## 2010-11-09 ENCOUNTER — Ambulatory Visit: Payer: Medicare PPO | Admitting: Family Medicine

## 2010-12-07 ENCOUNTER — Other Ambulatory Visit: Payer: Self-pay | Admitting: Family Medicine

## 2010-12-07 DIAGNOSIS — Z139 Encounter for screening, unspecified: Secondary | ICD-10-CM

## 2010-12-10 ENCOUNTER — Ambulatory Visit (HOSPITAL_COMMUNITY)
Admission: RE | Admit: 2010-12-10 | Discharge: 2010-12-10 | Disposition: A | Discharge status: Home / Self Care | Payer: Medicare PPO | Source: Ambulatory Visit | Attending: Family Medicine | Admitting: Family Medicine

## 2010-12-10 DIAGNOSIS — Z1231 Encounter for screening mammogram for malignant neoplasm of breast: Secondary | ICD-10-CM | POA: Insufficient documentation

## 2010-12-10 DIAGNOSIS — Z139 Encounter for screening, unspecified: Secondary | ICD-10-CM

## 2011-03-11 ENCOUNTER — Telehealth: Payer: Self-pay | Admitting: Family Medicine

## 2011-03-16 ENCOUNTER — Other Ambulatory Visit: Payer: Self-pay

## 2011-03-16 DIAGNOSIS — R7301 Impaired fasting glucose: Secondary | ICD-10-CM

## 2011-03-16 DIAGNOSIS — I10 Essential (primary) hypertension: Secondary | ICD-10-CM

## 2011-03-16 NOTE — Telephone Encounter (Signed)
Lab work printed and called and left message for her to return call.

## 2011-03-18 ENCOUNTER — Encounter: Payer: Self-pay | Admitting: Family Medicine

## 2011-03-23 ENCOUNTER — Encounter: Payer: Self-pay | Admitting: Family Medicine

## 2011-03-23 ENCOUNTER — Other Ambulatory Visit (HOSPITAL_COMMUNITY)
Admission: RE | Admit: 2011-03-23 | Discharge: 2011-03-23 | Disposition: A | Discharge status: Home / Self Care | Payer: Medicare PPO | Source: Ambulatory Visit | Attending: Family Medicine | Admitting: Family Medicine

## 2011-03-23 ENCOUNTER — Ambulatory Visit (INDEPENDENT_AMBULATORY_CARE_PROVIDER_SITE_OTHER): Payer: Medicare PPO | Admitting: Family Medicine

## 2011-03-23 VITALS — BP 130/74 | HR 90 | Resp 18 | Ht 67.0 in | Wt 337.0 lb

## 2011-03-23 DIAGNOSIS — E8881 Metabolic syndrome: Secondary | ICD-10-CM

## 2011-03-23 DIAGNOSIS — Z124 Encounter for screening for malignant neoplasm of cervix: Secondary | ICD-10-CM | POA: Insufficient documentation

## 2011-03-23 DIAGNOSIS — R87810 Cervical high risk human papillomavirus (HPV) DNA test positive: Secondary | ICD-10-CM | POA: Insufficient documentation

## 2011-03-23 DIAGNOSIS — Z1211 Encounter for screening for malignant neoplasm of colon: Secondary | ICD-10-CM

## 2011-03-23 DIAGNOSIS — I10 Essential (primary) hypertension: Secondary | ICD-10-CM

## 2011-03-23 DIAGNOSIS — R7301 Impaired fasting glucose: Secondary | ICD-10-CM

## 2011-03-23 DIAGNOSIS — Z01419 Encounter for gynecological examination (general) (routine) without abnormal findings: Secondary | ICD-10-CM

## 2011-03-23 DIAGNOSIS — Z0000 Encounter for general adult medical examination without abnormal findings: Secondary | ICD-10-CM

## 2011-03-23 HISTORY — DX: Metabolic syndrome: E88.810

## 2011-03-23 HISTORY — DX: Metabolic syndrome: E88.81

## 2011-03-23 LAB — POC HEMOCCULT BLD/STL (OFFICE/1-CARD/DIAGNOSTIC): Fecal Occult Blood, POC: NEGATIVE

## 2011-03-23 NOTE — Patient Instructions (Addendum)
F/u in 4.5 month  All the best with your surgery.  Please change your eating habits to set times, smaller portions and mainly vegetables, so that you can lose weight  Fasting lipid, chem 7, hBA1C and Vit D and TSH in 4 mo.5 months before visit  Your blood sugar average has increased, this can improve and needs to by changing how you eat and weight loss.  All the best, I know you can do this!

## 2011-03-23 NOTE — Telephone Encounter (Signed)
Appointment today

## 2011-03-29 NOTE — Assessment & Plan Note (Signed)
Controlled, no change in medication  

## 2011-03-29 NOTE — Assessment & Plan Note (Signed)
Deteriorated. Patient re-educated about  the importance of commitment to a  minimum of 150 minutes of exercise per week. The importance of healthy food choices with portion control discussed. Encouraged to start a food diary, count calories and to consider  joining a support group. Sample diet sheets offered. Goals set by the patient for the next several months.    

## 2011-03-29 NOTE — Progress Notes (Signed)
  Subjective:    Patient ID: Michele Lowe, female    DOB: [birth year], 61 y.o.   MRN: [id number]  HPI The PT is here for annual exam and re-evaluation of chronic medical conditions, medication management and review of any available recent lab and radiology data.  Preventive health is updated, specifically  Cancer screening and Immunization.   Questions or concerns regarding consultations or procedures which the PT has had in the interim are  addressed. The PT denies any adverse reactions to current medications since the last visit.  There are no new concerns.She does have upcoming knee surgery at [name]  There are no specific new  complaints , she has gained weight and realizes the absolute need to change how she eats. Her ability to exercise is increasingly limited      Review of Systems See HPI Denies recent fever or chills. Denies sinus pressure, nasal congestion, ear pain or sore throat. Denies chest congestion, productive cough or wheezing. Denies chest pains, palpitations and leg swelling Denies abdominal pain, nausea, vomiting,diarrhea or constipation.   Denies dysuria, frequency, hesitancy or incontinence.  Denies headaches, seizures, numbness, or tingling. Denies depression, anxiety or insomnia. Chronic dermatitis of left leg      Objective:   Physical Exam Pleasant well nourished female, alert and oriented x 3, in no cardio-pulmonary distress. Afebrile. HEENT No facial trauma or asymetry. Sinuses non tender.  EOMI, PERTL, fundoscopic exam is normal, no hemorhage or exudate.  External ears normal, tympanic membranes clear. Oropharynx moist, no exudate, good dentition. Neck: supple, no adenopathy,JVD or thyromegaly.No bruits.  Chest: Clear to ascultation bilaterally.No crackles or wheezes. Non tender to palpation  Breast: No asymetry,no masses. No nipple discharge or inversion. No axillary or supraclavicular adenopathy  Cardiovascular system; Heart sounds  normal,  S1 and  S2 ,no S3.  No murmur, or thrill. Apical beat not displaced Peripheral pulses normal.  Abdomen: Soft, non tender, no organomegaly or masses. No bruits. Bowel sounds normal. No guarding, tenderness or rebound.  Rectal:  No mass. Guaiac negative stool.  GU: External genitalia normal. No lesions. Vaginal canal normal.No discharge. Uterus , absent, no palpable  masses, no adnexal tenderness.  Musculoskeletal exam: decreased ROM of spine, hips , shoulders and knees. No deformity ,swelling or crepitus noted. No muscle wasting or atrophy.   Neurologic: Cranial nerves 2 to 12 intact. Power, tone ,sensation and reflexes normal throughout. No disturbance in gait. No tremor.  Skin: Intact, no ulceration, noted. Hyperpigmentation anterior left leg, chronic  Psych; Normal mood and affect. Judgement and concentration normal        Assessment & Plan:

## 2011-04-02 ENCOUNTER — Other Ambulatory Visit: Payer: Self-pay | Admitting: Family Medicine

## 2011-04-28 ENCOUNTER — Telehealth: Payer: Self-pay | Admitting: Family Medicine

## 2011-05-04 NOTE — Telephone Encounter (Signed)
Needs to sign release for [place] to send information.

## 2011-05-13 HISTORY — PX: JOINT REPLACEMENT: SHX530

## 2011-05-31 ENCOUNTER — Telehealth: Payer: Self-pay | Admitting: Family Medicine

## 2011-05-31 NOTE — Telephone Encounter (Signed)
Needs ov to asses blood pressure , please schedule soonest appointment, this week, and she need to bring the meds with her that she is currently taking

## 2011-06-07 ENCOUNTER — Encounter: Payer: Self-pay | Admitting: Family Medicine

## 2011-06-07 ENCOUNTER — Ambulatory Visit (INDEPENDENT_AMBULATORY_CARE_PROVIDER_SITE_OTHER): Payer: Medicare PPO | Admitting: Family Medicine

## 2011-06-07 VITALS — BP 110/80 | HR 90 | Resp 18 | Ht 67.0 in | Wt 324.0 lb

## 2011-06-07 DIAGNOSIS — M25569 Pain in unspecified knee: Secondary | ICD-10-CM

## 2011-06-07 DIAGNOSIS — J309 Allergic rhinitis, unspecified: Secondary | ICD-10-CM

## 2011-06-07 DIAGNOSIS — Z79899 Other long term (current) drug therapy: Secondary | ICD-10-CM

## 2011-06-07 DIAGNOSIS — R7301 Impaired fasting glucose: Secondary | ICD-10-CM

## 2011-06-07 DIAGNOSIS — R5383 Other fatigue: Secondary | ICD-10-CM

## 2011-06-07 DIAGNOSIS — E8881 Metabolic syndrome: Secondary | ICD-10-CM

## 2011-06-07 DIAGNOSIS — I10 Essential (primary) hypertension: Secondary | ICD-10-CM

## 2011-06-07 DIAGNOSIS — R5381 Other malaise: Secondary | ICD-10-CM

## 2011-06-07 MED ORDER — FUROSEMIDE 40 MG PO TABS: 40.0000 mg | ORAL_TABLET | Freq: Every day | ORAL | Status: DC

## 2011-06-07 MED ORDER — LORATADINE 10 MG PO TABS: 10.0000 mg | ORAL_TABLET | Freq: Every day | ORAL | Status: DC

## 2011-06-07 NOTE — Progress Notes (Signed)
  Subjective:    Patient ID: Michele Lowe, female    DOB: 1950/05/21, 61 y.o.   MRN: [id number]  HPI Pt in  with primary concern of uncontrolled blood pressure. She had revision of left knee replacement in 04/2011, went to skilled facility for rehab, is making steady progress. She is now home and getting in home rehab. On more than one occasion there was concern about blood pressure being elevated, so she called   in to find out "what she should do about this" Reports good progress as far as therapy is concerned, takes pain med prior to pt, c/o itch but no other symptom with this, I advised her to address this with orhtho Bowels regular with help at times, initially had difficulty voiding urine when catheter was removed, now no problems   Review of Systems See HPI Denies recent fever or chills. Denies sinus pressure, nasal congestion, ear pain or sore throat. Denies chest congestion, productive cough or wheezing. Denies chest pains, palpitations and leg swelling Denies abdominal pain, nausea, vomiting,diarrhea or constipation.   Denies dysuria, frequency, hesitancy or incontinence. Denies headaches, seizures, numbness, or tingling. Denies depression, anxiety or insomnia. Denies skin break down or rash.        Objective:   Physical Exam  Patient alert and oriented and in no cardiopulmonary distress.  HEENT: No facial asymmetry, EOMI, no sinus tenderness,  oropharynx pink and moist.  Neck supple no adenopathy.  Chest: Clear to auscultation bilaterally.  CVS: S1, S2 no murmurs, no S3.  ABD: Soft non tender. Bowel sounds normal.  Ext: No edema  MS: Adequate ROM spine, shoulders, hips and reduced in left knee.  Skin: Intact, no ulcerations or rash noted.  Psych: Good eye contact, normal affect. Memory intact not anxious or depressed appearing.  CNS: CN 2-12 intact, power, tone and sensation normal throughout.       Assessment & Plan:

## 2011-06-07 NOTE — Assessment & Plan Note (Signed)
Controlled, no change in medication  

## 2011-06-07 NOTE — Patient Instructions (Addendum)
F/u in 3.5 month  Your blood pressure is excellent, continue , current medication  Fasting lipid, chem 7, HBA1C, and TSH  In 3.5 month, before visit    You have lost weight which is good, please conciously make healthy food choices while your mobility is limited.  Drink water only

## 2011-06-08 ENCOUNTER — Other Ambulatory Visit: Payer: Self-pay | Admitting: Family Medicine

## 2011-06-08 ENCOUNTER — Telehealth: Payer: Self-pay | Admitting: Family Medicine

## 2011-06-08 MED ORDER — HYDROXYZINE HCL 10 MG PO TABS: 10.0000 mg | ORAL_TABLET | Freq: Three times a day (TID) | ORAL | Status: AC | PRN

## 2011-06-08 NOTE — Telephone Encounter (Signed)
Pt to take hydroxyzine, she is aware

## 2011-06-08 NOTE — Telephone Encounter (Signed)
Lips started swelling yesterday after she ate crab Sunday. Now she has hives on her right hand and arm. Benadryl not helping take the itch away or the swelling. No problems breathing. Wants something sent in stronger for it.

## 2011-06-22 ENCOUNTER — Telehealth: Payer: Self-pay | Admitting: Family Medicine

## 2011-06-23 ENCOUNTER — Telehealth: Payer: Self-pay | Admitting: Family Medicine

## 2011-06-23 NOTE — Telephone Encounter (Signed)
I already have a message regarding this patient and I have sent the message to Dr Lodema Hong

## 2011-06-24 NOTE — Telephone Encounter (Signed)
Pt reports that she always gets a rash the 3 days she has physical therapy she needs iinc amts of antuihistamine, needs to d/c the pain med will spk with ortho

## 2011-06-24 NOTE — Telephone Encounter (Signed)
Pt herself to spk with ortho per my advice

## 2011-06-28 ENCOUNTER — Encounter: Payer: Self-pay | Admitting: Family Medicine

## 2011-06-28 NOTE — Assessment & Plan Note (Signed)
Positive weight loss, pt applauded on this and encouraged to continue same

## 2011-06-28 NOTE — Assessment & Plan Note (Signed)
Controlled, no change in medication  

## 2011-06-28 NOTE — Assessment & Plan Note (Signed)
Improved since surgery

## 2011-08-11 ENCOUNTER — Ambulatory Visit: Payer: Medicare PPO | Admitting: Family Medicine

## 2011-09-03 ENCOUNTER — Other Ambulatory Visit: Payer: Self-pay

## 2011-09-03 ENCOUNTER — Telehealth: Payer: Self-pay | Admitting: Family Medicine

## 2011-09-03 MED ORDER — TRIAMTERENE-HCTZ 37.5-25 MG PO TABS: 1.0000 | ORAL_TABLET | Freq: Every day | ORAL | Status: DC

## 2011-09-03 NOTE — Telephone Encounter (Signed)
Request sent to pharmacy

## 2011-09-09 ENCOUNTER — Telehealth: Payer: Self-pay | Admitting: Family Medicine

## 2011-09-09 MED ORDER — TRIAMTERENE-HCTZ 37.5-25 MG PO TABS: ORAL_TABLET | ORAL | Status: DC

## 2011-09-09 NOTE — Telephone Encounter (Signed)
meds refilled 

## 2011-09-21 ENCOUNTER — Other Ambulatory Visit: Payer: Self-pay | Admitting: Family Medicine

## 2011-09-29 ENCOUNTER — Ambulatory Visit: Payer: Medicare PPO | Admitting: Family Medicine

## 2011-09-29 ENCOUNTER — Encounter: Payer: Self-pay | Admitting: Family Medicine

## 2011-10-01 ENCOUNTER — Encounter: Payer: Self-pay | Admitting: Family Medicine

## 2011-10-19 ENCOUNTER — Encounter: Payer: Self-pay | Admitting: Family Medicine

## 2011-10-19 ENCOUNTER — Other Ambulatory Visit: Payer: Self-pay | Admitting: Family Medicine

## 2011-10-19 ENCOUNTER — Ambulatory Visit (INDEPENDENT_AMBULATORY_CARE_PROVIDER_SITE_OTHER): Payer: Medicare PPO | Admitting: Family Medicine

## 2011-10-19 VITALS — BP 146/82 | HR 96 | Resp 18 | Wt 337.0 lb

## 2011-10-19 DIAGNOSIS — R7303 Prediabetes: Secondary | ICD-10-CM | POA: Insufficient documentation

## 2011-10-19 DIAGNOSIS — R7309 Other abnormal glucose: Secondary | ICD-10-CM

## 2011-10-19 DIAGNOSIS — I10 Essential (primary) hypertension: Secondary | ICD-10-CM

## 2011-10-19 DIAGNOSIS — J309 Allergic rhinitis, unspecified: Secondary | ICD-10-CM

## 2011-10-19 DIAGNOSIS — R413 Other amnesia: Secondary | ICD-10-CM

## 2011-10-19 MED ORDER — TRIAMTERENE-HCTZ 37.5-25 MG PO TABS: ORAL_TABLET | ORAL | Status: DC

## 2011-10-19 MED ORDER — METFORMIN HCL 500 MG PO TABS: 500.0000 mg | ORAL_TABLET | Freq: Every day | ORAL | Status: DC

## 2011-10-19 NOTE — Patient Instructions (Addendum)
Annual wellness in early December..  Blood pressure is high INCREASE triamterene to ONE AND A HALF TABLETS once daily.  You will be referred for imaging of your brain, and B12 and RPR levels will be added to labs due to your concern about recent memory loss, and you will have mini mental status exam in office today to asses memory.  Please use techniques discused to help with memory  You need to call the podiatrist to see if he can prescribe custom built shoes for you

## 2011-10-20 LAB — LIPID PANEL
Cholesterol: 156 mg/dL (ref 0–200)
HDL: 44 mg/dL (ref >39)
Total CHOL/HDL Ratio: 3.5 Ratio
VLDL: 17 mg/dL (ref 0–40)

## 2011-10-20 LAB — BASIC METABOLIC PANEL
BUN: 17 mg/dL (ref 6–23)
Calcium: 9.4 mg/dL (ref 8.4–10.5)
Glucose, Bld: 93 mg/dL (ref 70–99)
Potassium: 3.8 mEq/L (ref 3.5–5.3)

## 2011-10-20 LAB — HEMOGLOBIN A1C
Hgb A1c MFr Bld: 5.9 % — ABNORMAL HIGH (ref <5.7)
Mean Plasma Glucose: 123 mg/dL — ABNORMAL HIGH (ref <117)

## 2011-10-22 ENCOUNTER — Other Ambulatory Visit: Payer: Self-pay

## 2011-10-22 DIAGNOSIS — I10 Essential (primary) hypertension: Secondary | ICD-10-CM

## 2011-10-22 LAB — VITAMIN B12: Vitamin B-12: 494 pg/mL (ref 211–911)

## 2011-10-22 MED ORDER — TRIAMTERENE-HCTZ 37.5-25 MG PO TABS: ORAL_TABLET | ORAL | Status: DC

## 2011-10-23 LAB — RPR

## 2011-10-24 DIAGNOSIS — R413 Other amnesia: Secondary | ICD-10-CM | POA: Insufficient documentation

## 2011-10-26 ENCOUNTER — Telehealth: Payer: Self-pay | Admitting: Family Medicine

## 2011-10-29 NOTE — Telephone Encounter (Signed)
Patient aware of lab work

## 2011-10-30 NOTE — Assessment & Plan Note (Signed)
Pt concerned that following recent surgery she is having memory impairment. Normal MMSE in office. Lifestyle modification as far as improving short term memory and becoming more organized is stressed

## 2011-10-30 NOTE — Assessment & Plan Note (Signed)
Uncontrolled, increase dose of maxzide DASH diet and commitment to daily physical activity for a minimum of 30 minutes discussed and encouraged, as a part of hypertension management. The importance of attaining a healthy weight is also discussed.  

## 2011-10-30 NOTE — Assessment & Plan Note (Signed)
Deteriorated. Patient re-educated about  the importance of commitment to a  minimum of 150 minutes of exercise per week. The importance of healthy food choices with portion control discussed. Encouraged to start a food diary, count calories and to consider  joining a support group. Sample diet sheets offered. Goals set by the patient for the next several months.    

## 2011-10-30 NOTE — Progress Notes (Signed)
  Subjective:    Patient ID: Michele Lowe, female    DOB: [birth year], 61 y.o.   MRN: [id number]  HPI The PT is here for follow up and re-evaluation of chronic medical conditions, medication management and review of any available recent lab and radiology data.  Preventive health is updated, specifically  Cancer screening and Immunization.   Questions or concerns regarding consultations or procedures which the PT has had in the interim are  addressed. The PT denies any adverse reactions to current medications since the last visit.  C/o new memory loss following recent surgery Frustrated with weight gain , but will continue to work on this    Review of Systems See HPI Denies recent fever or chills. Denies sinus pressure, nasal congestion, ear pain or sore throat. Denies chest congestion, productive cough or wheezing. Denies chest pains, palpitations and leg swelling Denies abdominal pain, nausea, vomiting,diarrhea or constipation.   Denies dysuria, frequency, hesitancy or incontinence. C/o   limitation in mobility.improved knee pain following recent surgery Denies headaches, seizures, numbness, or tingling. Denies depression, anxiety or insomnia. Denies skin break down or rash.        Objective:   Physical Exam  Patient alert and oriented and in no cardiopulmonary distress.  HEENT: No facial asymmetry, EOMI, no sinus tenderness,  oropharynx pink and moist.  Neck supple no adenopathy.  Chest: Clear to auscultation bilaterally.  CVS: S1, S2 no murmurs, no S3.  ABD: Soft non tender. Bowel sounds normal.  Ext: No edema  MS: Adequate though reduced  ROM spine, shoulders, hips and knees.  Skin: Intact, no ulcerations or rash noted.  Psych: Good eye contact, normal affect. Memory intact not anxious or depressed appearing.  CNS: CN 2-12 intact, power, tone and sensation normal throughout.       Assessment & Plan:

## 2011-10-30 NOTE — Assessment & Plan Note (Signed)
Improved HBA1C despite weight gain. Will start metformin. Patient educated about the importance of limiting  Carbohydrate intake , the need to commit to daily physical activity for a minimum of 30 minutes , and to commit weight loss. The fact that changes in all these areas will reduce or eliminate all together the development of diabetes is stressed.

## 2011-10-30 NOTE — Assessment & Plan Note (Signed)
Controlled, no change in medication  

## 2011-11-30 ENCOUNTER — Other Ambulatory Visit: Payer: Self-pay | Admitting: Family Medicine

## 2011-11-30 DIAGNOSIS — Z139 Encounter for screening, unspecified: Secondary | ICD-10-CM

## 2011-12-13 ENCOUNTER — Ambulatory Visit (HOSPITAL_COMMUNITY)
Admission: RE | Admit: 2011-12-13 | Discharge: 2011-12-13 | Disposition: A | Discharge status: Home / Self Care | Payer: Medicare PPO | Source: Ambulatory Visit | Attending: Family Medicine | Admitting: Family Medicine

## 2011-12-13 DIAGNOSIS — Z1231 Encounter for screening mammogram for malignant neoplasm of breast: Secondary | ICD-10-CM | POA: Insufficient documentation

## 2011-12-13 DIAGNOSIS — Z139 Encounter for screening, unspecified: Secondary | ICD-10-CM

## 2012-01-25 ENCOUNTER — Ambulatory Visit (INDEPENDENT_AMBULATORY_CARE_PROVIDER_SITE_OTHER): Payer: Medicare PPO | Admitting: Family Medicine

## 2012-01-25 ENCOUNTER — Encounter: Payer: Self-pay | Admitting: Family Medicine

## 2012-01-25 VITALS — BP 120/80 | HR 88 | Resp 20 | Ht 67.0 in | Wt 344.0 lb

## 2012-01-25 DIAGNOSIS — R7309 Other abnormal glucose: Secondary | ICD-10-CM

## 2012-01-25 DIAGNOSIS — R7303 Prediabetes: Secondary | ICD-10-CM

## 2012-01-25 DIAGNOSIS — Z0000 Encounter for general adult medical examination without abnormal findings: Secondary | ICD-10-CM

## 2012-01-25 DIAGNOSIS — I10 Essential (primary) hypertension: Secondary | ICD-10-CM

## 2012-01-25 MED ORDER — PHENTERMINE HCL 37.5 MG PO TABS: 37.5000 mg | ORAL_TABLET | Freq: Every day | ORAL | Status: DC
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
## 2012-01-25 NOTE — Patient Instructions (Addendum)
F/u in 4 month  Work on weight loss through reduced food intake and regular physical activity please  Start phentermine HALF daily to work with weight loss, goal of 8 to 10 pounds in the next 4 month  HBa1C ,CBC, TSh and chem 7 in 4 month, non fasting in 4 month  Blood pressure is excellent

## 2012-02-06 DIAGNOSIS — Z0000 Encounter for general adult medical examination without abnormal findings: Secondary | ICD-10-CM | POA: Insufficient documentation

## 2012-02-06 NOTE — Assessment & Plan Note (Signed)
Annual wellness completed as documented. Major limitation is in mobility due to severe joint problems and morbid obesity. She is working on weight loss through lifestyle modification

## 2012-02-11 ENCOUNTER — Other Ambulatory Visit: Payer: Self-pay

## 2012-02-11 ENCOUNTER — Telehealth: Payer: Self-pay | Admitting: Family Medicine

## 2012-02-11 MED ORDER — FUROSEMIDE 40 MG PO TABS: 40.0000 mg | ORAL_TABLET | Freq: Every day | ORAL | Status: DC

## 2012-02-11 NOTE — Telephone Encounter (Signed)
Med reordered.

## 2012-05-18 LAB — CBC
MCH: 27.6 pg (ref 26.0–34.0)
MCV: 83 fL (ref 78.0–100.0)
Platelets: 262 10*3/uL (ref 150–400)
RBC: 4.82 MIL/uL (ref 3.87–5.11)
RDW: 14.4 % (ref 11.5–15.5)
WBC: 5.9 10*3/uL (ref 4.0–10.5)

## 2012-05-19 LAB — BASIC METABOLIC PANEL
BUN: 19 mg/dL (ref 6–23)
Chloride: 99 mEq/L (ref 96–112)
Creat: 0.79 mg/dL (ref 0.50–1.10)
Potassium: 4 mEq/L (ref 3.5–5.3)

## 2012-05-19 LAB — HEMOGLOBIN A1C: Hgb A1c MFr Bld: 6.1 % — ABNORMAL HIGH (ref <5.7)

## 2012-05-19 LAB — TSH: TSH: 1.339 u[IU]/mL (ref 0.350–4.500)

## 2012-05-25 ENCOUNTER — Ambulatory Visit (INDEPENDENT_AMBULATORY_CARE_PROVIDER_SITE_OTHER): Payer: Medicare PPO | Admitting: Family Medicine

## 2012-05-25 ENCOUNTER — Encounter: Payer: Self-pay | Admitting: Family Medicine

## 2012-05-25 VITALS — BP 124/90 | HR 94 | Resp 16 | Ht 67.0 in | Wt 346.0 lb

## 2012-05-25 DIAGNOSIS — I10 Essential (primary) hypertension: Secondary | ICD-10-CM

## 2012-05-25 DIAGNOSIS — R7303 Prediabetes: Secondary | ICD-10-CM

## 2012-05-25 DIAGNOSIS — R7309 Other abnormal glucose: Secondary | ICD-10-CM

## 2012-05-25 DIAGNOSIS — R5381 Other malaise: Secondary | ICD-10-CM

## 2012-05-25 DIAGNOSIS — J029 Acute pharyngitis, unspecified: Secondary | ICD-10-CM

## 2012-05-25 DIAGNOSIS — E8881 Metabolic syndrome: Secondary | ICD-10-CM

## 2012-05-25 DIAGNOSIS — Z1211 Encounter for screening for malignant neoplasm of colon: Secondary | ICD-10-CM

## 2012-05-25 NOTE — Assessment & Plan Note (Signed)
Deteriorated Patient educated about the importance of limiting  Carbohydrate intake , the need to commit to daily physical activity for a minimum of 30 minutes , and to commit weight loss. The fact that changes in all these areas will reduce or eliminate all together the development of diabetes is stressed.    

## 2012-05-25 NOTE — Progress Notes (Signed)
  Subjective:    Patient ID: Michele Lowe, female    DOB: 1950-03-10, 62 y.o.   MRN: [id number]  HPI The PT is here for follow up and re-evaluation of chronic medical conditions, medication management and review of any available recent lab and radiology data.  Preventive health is updated, specifically  Cancer screening and Immunization.   2 weeks ago had sore throat, h/o reptd sore throats in the past, never fever, no cough orsinus drainage. Concerned about possible strep though very unlikely based on history Inconsistent weight loss attempts with weight gain     Review of Systems See HPI Denies recent fever or chills. Denies sinus pressure, nasal congestion, ear pain  Denies chest congestion, productive cough or wheezing. Denies chest pains, palpitations and leg swelling Denies abdominal pain, nausea, vomiting,diarrhea or constipation.  Denies change in BM Denies dysuria, frequency, hesitancy or incontinence. Chronic knee pain improved. Denies headaches, seizures, numbness, or tingling. Denies depression, anxiety or insomnia. Denies skin break down has chronic bilateral lower extremity stasis with hyperpigmentation, no skin breakdown       Objective:   Physical Exam Patient alert and oriented and in no cardiopulmonary distress.  HEENT: No facial asymmetry, EOMI, no sinus tenderness,  oropharynx pink and moist.  Neck supple no adenopathy.Tonsils slightly enlarged and hyperemic, no exudate Rapid strep negative  Chest: Clear to auscultation bilaterally.  CVS: S1, S2 no murmurs, no S3.  ABD: Soft non tender. Bowel sounds normal.Rectal; no mas, heme negative stool  Ext: No edema  MS: Adequate though reduced  ROM spine, shoulders, hips and knees.  Skin: Intact, no ulcerations , hyperpigmented lower extremity rash noted.  Psych: Good eye contact, normal affect. Memory intact not anxious or depressed appearing.  CNS: CN 2-12 intact, power, tone and sensation normal  throughout.        Assessment & Plan:

## 2012-05-25 NOTE — Assessment & Plan Note (Signed)
Uncontrolled, no med change DASH diet and commitment to daily physical activity for a minimum of 30 minutes discussed and encouraged, as a part of hypertension management. The importance of attaining a healthy weight is also discussed.  

## 2012-05-25 NOTE — Assessment & Plan Note (Signed)
negaive rapid strep test pt reasssured no bacterial infection, historically and exam were negative also

## 2012-05-25 NOTE — Assessment & Plan Note (Signed)
Deteriorated. Patient re-educated about  the importance of commitment to a  minimum of 150 minutes of exercise per week. The importance of healthy food choices with portion control discussed. Encouraged to start a food diary, count calories and to consider  joining a support group. Sample diet sheets offered. Goals set by the patient for the next several months.    

## 2012-05-25 NOTE — Patient Instructions (Addendum)
F/u end September.Pls call if you need me before  Weight loss goal of at least 2 pounds per month  Throat swab and rectal exam  Today  Fasting lipid, chem 7 , hBA1c end September before visit  Use melatonin which is oTC to help with sleep and practice good sleep hygiene, call in June if no better I will send in medication  It is important that you exercise regularly at least 30 minutes 5 times a week. If you develop chest pain, have severe difficulty breathing, or feel very tired, stop exercising immediately and seek medical attention    Blood pressure slightly elevated so please work on weight loss and exercise  Info on sleep hygiene , also 1500 cal diet sheet

## 2012-05-26 LAB — HEMOCCULT GUIAC POC 1CARD (OFFICE): Fecal Occult Blood, POC: NEGATIVE

## 2012-05-26 NOTE — Addendum Note (Signed)
Addended by: Abner Greenspan on: 05/26/2012 01:28 PM   Modules accepted: Orders

## 2012-05-30 ENCOUNTER — Other Ambulatory Visit: Payer: Self-pay

## 2012-05-30 DIAGNOSIS — I10 Essential (primary) hypertension: Secondary | ICD-10-CM

## 2012-05-30 MED ORDER — TRIAMTERENE-HCTZ 37.5-25 MG PO TABS: ORAL_TABLET | ORAL | Status: DC

## 2012-05-30 MED ORDER — LORATADINE 10 MG PO TABS: 10.0000 mg | ORAL_TABLET | Freq: Every day | ORAL | Status: AC

## 2012-05-30 MED ORDER — FUROSEMIDE 40 MG PO TABS: 40.0000 mg | ORAL_TABLET | Freq: Every day | ORAL | Status: DC

## 2012-06-20 IMAGING — MG MM DIGITAL SCREENING
5 series · 5 of 5 positions shown · non-contrast
Comparison: none

DG SCREEN MAMMOGRAM BILATERAL
Bilateral CC and MLO view(s) were taken.

DIGITAL SCREENING MAMMOGRAM WITH CAD:
There are scattered fibroglandular densities.  No masses or malignant type calcifications are 
identified.  Compared with prior studies.
Images were processed with CAD.

[L CC]
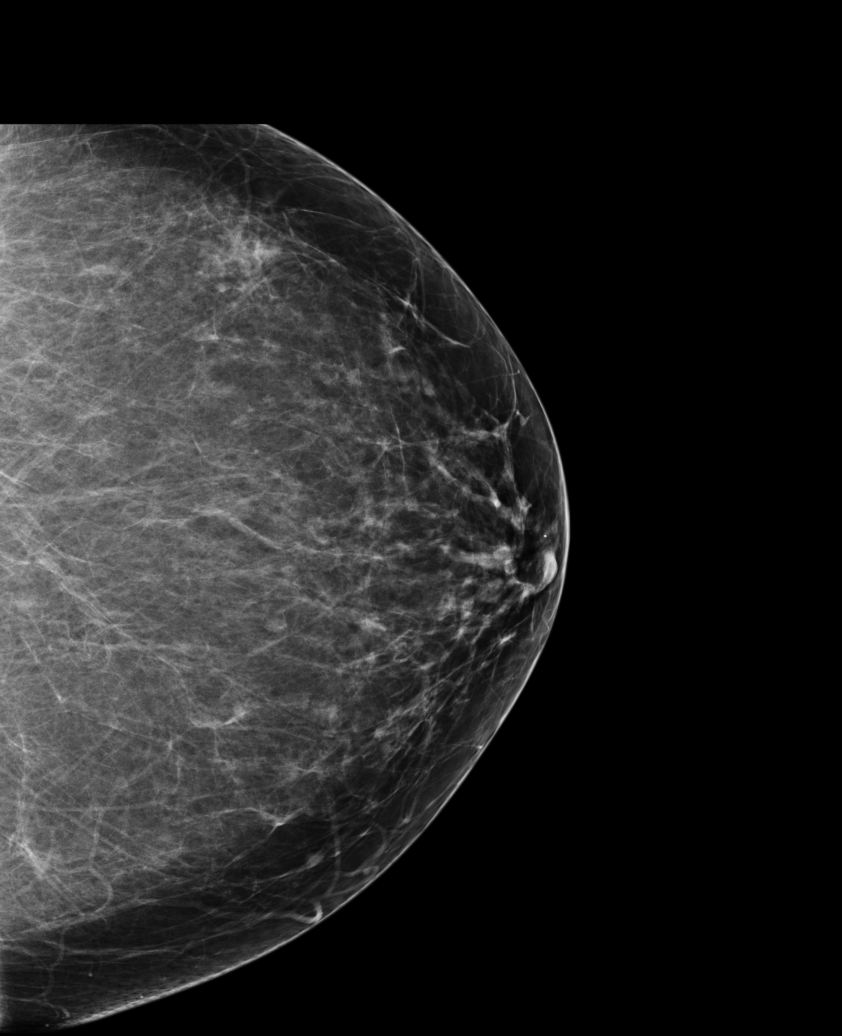

[L MLO (1 of 2)]
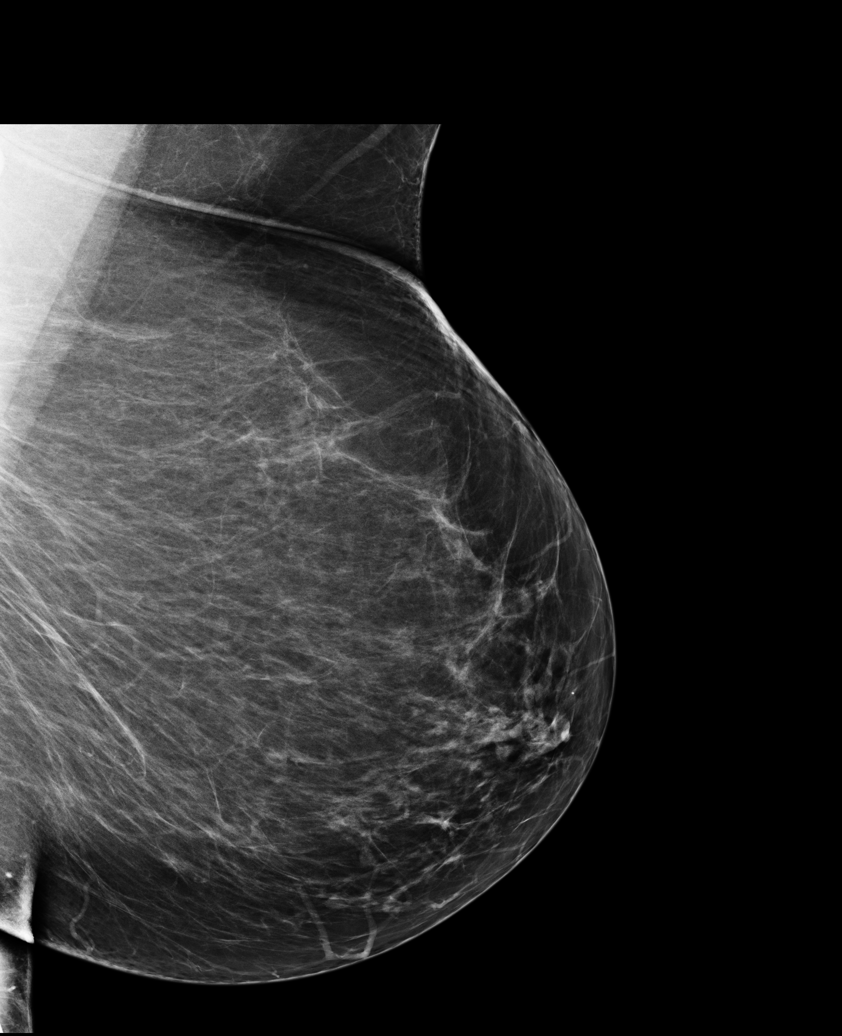

[R CC]
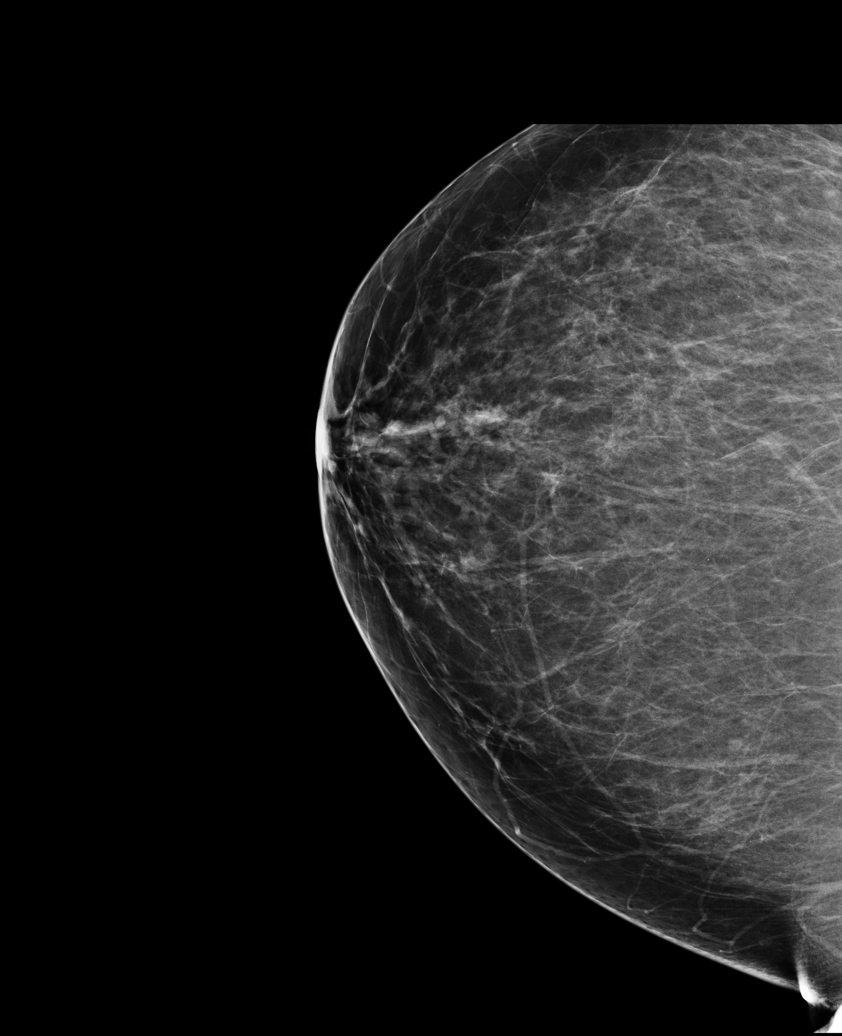

[R MLO]
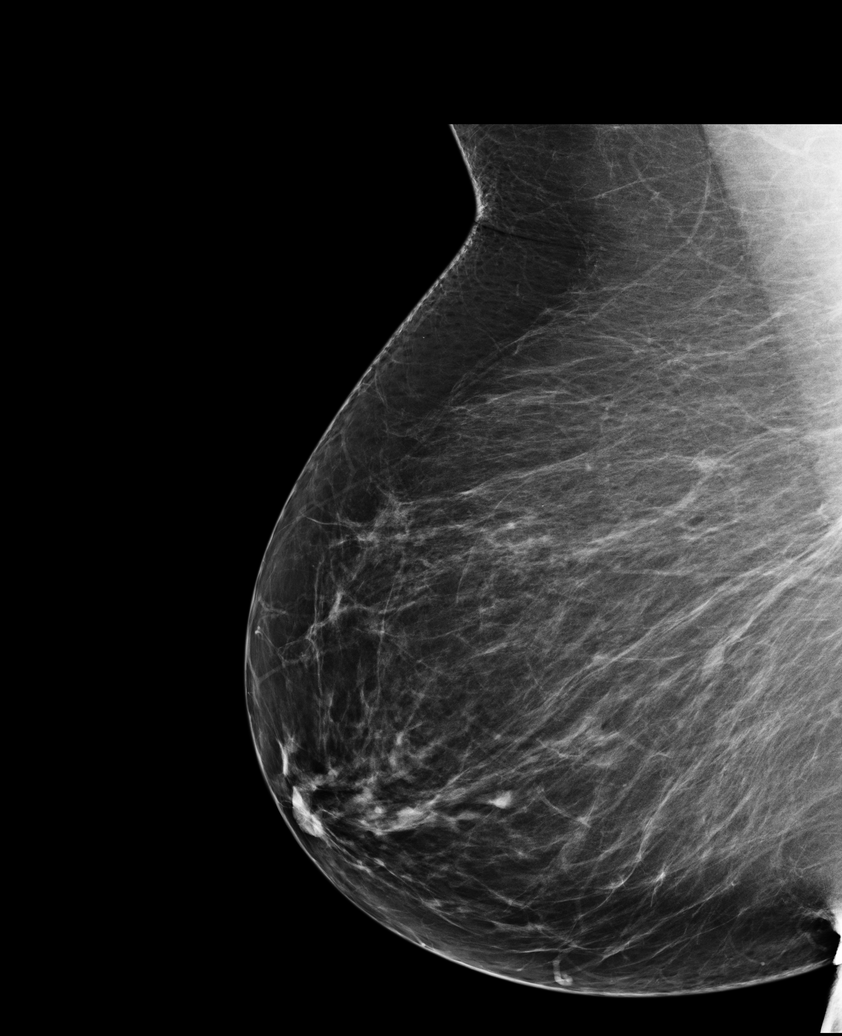

[L MLO (2 of 2)]
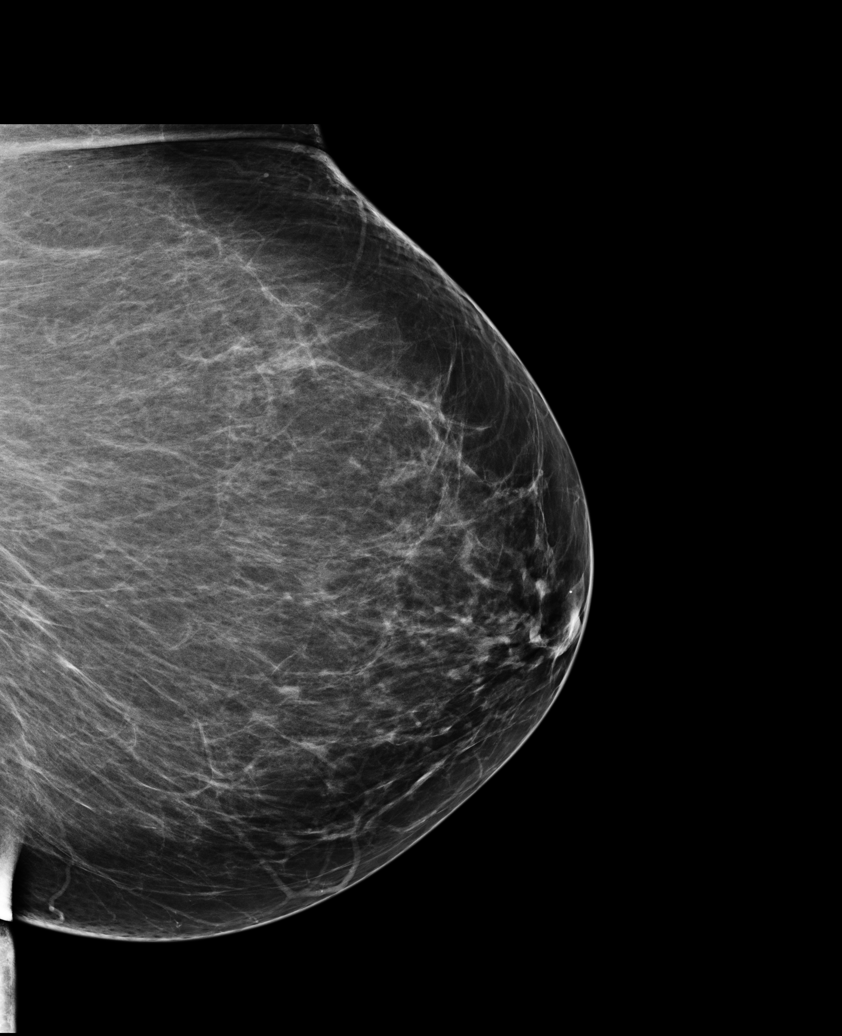

[5 of 5 positions shown; findings below may reference images not displayed]

IMPRESSION: No specific mammographic evidence of malignancy.  Next screening mammogram is recommended in one 
year.

A result letter of this screening mammogram will be mailed directly to the patient.

ASSESSMENT: Negative - BI-RADS 1

Screening mammogram in 1 year.
,

## 2012-06-21 ENCOUNTER — Telehealth: Payer: Self-pay | Admitting: Family Medicine

## 2012-06-23 NOTE — Telephone Encounter (Signed)
Spoke with patient and [facility] in [facility] potassium chl daily.

## 2012-09-04 ENCOUNTER — Telehealth: Payer: Self-pay | Admitting: Family Medicine

## 2012-09-04 ENCOUNTER — Other Ambulatory Visit: Payer: Self-pay

## 2012-09-04 DIAGNOSIS — I10 Essential (primary) hypertension: Secondary | ICD-10-CM

## 2012-09-04 MED ORDER — TRIAMTERENE-HCTZ 37.5-25 MG PO TABS: ORAL_TABLET | ORAL | Status: DC

## 2012-09-04 NOTE — Telephone Encounter (Signed)
Spoke with patient and she states that she was started back on prescription strength potassium.  No record of this found.  Please advise what strength potassium she should be taking.

## 2012-09-04 NOTE — Telephone Encounter (Signed)
pls check with her pharmacy to see last prescription of potassium collected for dosing directions and add to med list and send in Any more problems , pls let me know. I am assuming since she is saying she had prescription stength, then there should be a record where she filled the med, we will see

## 2012-09-04 NOTE — Telephone Encounter (Signed)
maxzide refilled but patient noted to only be on potassium otc  Will verify with patient

## 2012-09-27 ENCOUNTER — Ambulatory Visit (HOSPITAL_COMMUNITY)
Admission: RE | Admit: 2012-09-27 | Discharge: 2012-09-27 | Disposition: A | Discharge status: Home / Self Care | Payer: Medicare PPO | Source: Ambulatory Visit | Attending: Family Medicine | Admitting: Family Medicine

## 2012-09-27 ENCOUNTER — Ambulatory Visit: Payer: Medicare PPO | Admitting: Family Medicine

## 2012-09-27 ENCOUNTER — Ambulatory Visit (INDEPENDENT_AMBULATORY_CARE_PROVIDER_SITE_OTHER): Payer: PRIVATE HEALTH INSURANCE | Admitting: Family Medicine

## 2012-09-27 ENCOUNTER — Encounter: Payer: Self-pay | Admitting: Family Medicine

## 2012-09-27 VITALS — BP 146/82 | HR 100 | Resp 20 | Ht 67.0 in | Wt 336.0 lb

## 2012-09-27 DIAGNOSIS — M1990 Unspecified osteoarthritis, unspecified site: Secondary | ICD-10-CM

## 2012-09-27 DIAGNOSIS — M79609 Pain in unspecified limb: Secondary | ICD-10-CM | POA: Insufficient documentation

## 2012-09-27 DIAGNOSIS — M129 Arthropathy, unspecified: Secondary | ICD-10-CM

## 2012-09-27 DIAGNOSIS — R7309 Other abnormal glucose: Secondary | ICD-10-CM

## 2012-09-27 DIAGNOSIS — M7989 Other specified soft tissue disorders: Secondary | ICD-10-CM

## 2012-09-27 DIAGNOSIS — I8390 Asymptomatic varicose veins of unspecified lower extremity: Secondary | ICD-10-CM | POA: Insufficient documentation

## 2012-09-27 DIAGNOSIS — R7303 Prediabetes: Secondary | ICD-10-CM

## 2012-09-27 DIAGNOSIS — M159 Polyosteoarthritis, unspecified: Secondary | ICD-10-CM

## 2012-09-27 DIAGNOSIS — R609 Edema, unspecified: Secondary | ICD-10-CM | POA: Insufficient documentation

## 2012-09-27 DIAGNOSIS — I10 Essential (primary) hypertension: Secondary | ICD-10-CM

## 2012-09-27 MED ORDER — KETOROLAC TROMETHAMINE 60 MG/2ML IJ SOLN
60.0000 mg | Freq: Once | INTRAMUSCULAR | Status: AC
  Administered 2012-09-27: 60 mg via INTRAMUSCULAR

## 2012-09-27 NOTE — Progress Notes (Signed)
  Subjective:    Patient ID: Michele Lowe, female    DOB: [birth year], 62 y.o.   MRN: [id number]  HPI Presents with a 2 week h/o increased redness pain and swelling of the right calf, worried about a possible blood clot. States she has had in the past, but careful review of history reveals thrombophlebitis and not clot. Denies shortness of breath, cough or hemoptysis. Has focussed on change in diet with weight loss success for which she is congratulated C/o increased generalized joint pain and stiffness requests injection for same    Review of Systems See HPI Denies recent fever or chills. Denies sinus pressure, nasal congestion, ear pain or sore throat. Denies chest congestion, productive cough or wheezing. Denies chest pains, palpitations and leg swelling Denies abdominal pain, nausea, vomiting,diarrhea or constipation.   Denies dysuria, frequency, hesitancy or incontinence.  Denies headaches, seizures, numbness, or tingling. Denies depression, anxiety or insomnia. Denies skin break down or rash.        Objective:   Physical Exam  Patient alert and oriented and in no cardiopulmonary distress.  HEENT: No facial asymmetry, EOMI, no sinus tenderness,  oropharynx pink and moist.  Neck supple no adenopathy.  Chest: Clear to auscultation bilaterally.  CVS: S1, S2 no murmurs, no S3.  ABD: Soft non tender. Bowel sounds normal.  Ext: No edema  MS: decreased  ROM spine,  hips and knees.  Skin: Intact, mild erythema and warmth of left calf, with swelling of the right  calf with tenderness  Psych: Good eye contact, normal affect. Memory intact not anxious or depressed appearing.  CNS: CN 2-12 intact, power, tone and sensation normal throughout.       Assessment & Plan:

## 2012-09-27 NOTE — Patient Instructions (Addendum)
F/u in 3.5 monmth, call if you need me before.  Congrats on weight loss of 10 pounds, keep it up!  Please get the study ordered  To ensure you have no clot in the right leg  Toradol 60mg  Im today for arthritis pain and stiffness  hBa1C and chem 7 in 3.5 months before next visit pls

## 2012-09-28 ENCOUNTER — Telehealth: Payer: Self-pay | Admitting: Family Medicine

## 2012-09-28 NOTE — Telephone Encounter (Signed)
Spoke with pt, nothing else to be done at this time. She reports continued improvement in redness and swelling of leg, thinks the toradol helped her, I assured her no clot is in the leg

## 2012-09-28 NOTE — Telephone Encounter (Signed)
Patient in for visit and medication clarified.

## 2012-10-08 NOTE — Assessment & Plan Note (Signed)
Update dlab next visit Patient educated about the importance of limiting  Carbohydrate intake , the need to commit to daily physical activity for a minimum of 30 minutes , and to commit weight loss. The fact that changes in all these areas will reduce or eliminate all together the development of diabetes is stressed.

## 2012-10-08 NOTE — Assessment & Plan Note (Signed)
Clinical suspicion for DVt is low, however due to relative immobility, and pt presenting concern, [place] ordered as stat, which is negative

## 2012-10-08 NOTE — Assessment & Plan Note (Signed)
Improved. Pt applauded on succesful weight loss through lifestyle change, and encouraged to continue same. Weight loss goal set for the next several months.  

## 2012-10-08 NOTE — Assessment & Plan Note (Signed)
Increased generalized joint pain, toradol in office

## 2012-10-08 NOTE — Assessment & Plan Note (Signed)
Sub optimal control at this visit. No med change DASH diet and commitment to daily physical activity for a minimum of 30 minutes discussed and encouraged, as a part of hypertension management. The importance of attaining a healthy weight is also discussed.

## 2012-10-25 ENCOUNTER — Ambulatory Visit: Payer: Medicare PPO | Admitting: Family Medicine

## 2012-11-22 ENCOUNTER — Other Ambulatory Visit: Payer: Self-pay | Admitting: Family Medicine

## 2012-11-22 DIAGNOSIS — Z139 Encounter for screening, unspecified: Secondary | ICD-10-CM

## 2012-12-14 ENCOUNTER — Ambulatory Visit (HOSPITAL_COMMUNITY)
Admission: RE | Admit: 2012-12-14 | Discharge: 2012-12-14 | Disposition: A | Discharge status: Home / Self Care | Payer: Medicare FFS | Source: Ambulatory Visit | Attending: Family Medicine | Admitting: Family Medicine

## 2012-12-14 DIAGNOSIS — Z1231 Encounter for screening mammogram for malignant neoplasm of breast: Secondary | ICD-10-CM | POA: Insufficient documentation

## 2012-12-14 DIAGNOSIS — Z139 Encounter for screening, unspecified: Secondary | ICD-10-CM

## 2012-12-15 ENCOUNTER — Other Ambulatory Visit: Payer: Self-pay

## 2012-12-15 MED ORDER — POTASSIUM CHLORIDE CRYS ER 20 MEQ PO TBCR: 20.0000 meq | EXTENDED_RELEASE_TABLET | Freq: Every day | ORAL | Status: DC

## 2013-01-16 ENCOUNTER — Encounter (INDEPENDENT_AMBULATORY_CARE_PROVIDER_SITE_OTHER): Payer: Self-pay

## 2013-01-16 ENCOUNTER — Ambulatory Visit (INDEPENDENT_AMBULATORY_CARE_PROVIDER_SITE_OTHER): Payer: Medicare FFS | Admitting: Family Medicine

## 2013-01-16 ENCOUNTER — Encounter: Payer: Self-pay | Admitting: Family Medicine

## 2013-01-16 VITALS — BP 138/82 | HR 77 | Resp 16 | Ht 67.0 in | Wt 344.0 lb

## 2013-01-16 DIAGNOSIS — R229 Localized swelling, mass and lump, unspecified: Secondary | ICD-10-CM

## 2013-01-16 DIAGNOSIS — H5710 Ocular pain, unspecified eye: Secondary | ICD-10-CM

## 2013-01-16 DIAGNOSIS — R2241 Localized swelling, mass and lump, right lower limb: Secondary | ICD-10-CM

## 2013-01-16 DIAGNOSIS — H5711 Ocular pain, right eye: Secondary | ICD-10-CM

## 2013-01-16 DIAGNOSIS — R894 Abnormal immunological findings in specimens from other organs, systems and tissues: Secondary | ICD-10-CM

## 2013-01-16 DIAGNOSIS — R079 Chest pain, unspecified: Secondary | ICD-10-CM

## 2013-01-16 DIAGNOSIS — R7309 Other abnormal glucose: Secondary | ICD-10-CM

## 2013-01-16 DIAGNOSIS — R7303 Prediabetes: Secondary | ICD-10-CM

## 2013-01-16 DIAGNOSIS — K219 Gastro-esophageal reflux disease without esophagitis: Secondary | ICD-10-CM

## 2013-01-16 DIAGNOSIS — R768 Other specified abnormal immunological findings in serum: Secondary | ICD-10-CM

## 2013-01-16 DIAGNOSIS — E8881 Metabolic syndrome: Secondary | ICD-10-CM

## 2013-01-16 DIAGNOSIS — I10 Essential (primary) hypertension: Secondary | ICD-10-CM

## 2013-01-16 DIAGNOSIS — R51 Headache: Secondary | ICD-10-CM

## 2013-01-16 LAB — COMPLETE METABOLIC PANEL WITH GFR
ALT: 27 U/L (ref 0–35)
AST: 25 U/L (ref 0–37)
Albumin: 4.1 g/dL (ref 3.5–5.2)
Alkaline Phosphatase: 116 U/L (ref 39–117)
Calcium: 9.4 mg/dL (ref 8.4–10.5)
GFR, Est Non African American: 74 mL/min
Sodium: 140 mEq/L (ref 135–145)
Total Bilirubin: 0.6 mg/dL (ref 0.3–1.2)
Total Protein: 7.3 g/dL (ref 6.0–8.3)

## 2013-01-16 LAB — LIPID PANEL
Cholesterol: 156 mg/dL (ref 0–200)
HDL: 39 mg/dL — ABNORMAL LOW (ref >39)
LDL Cholesterol: 85 mg/dL (ref 0–99)
Total CHOL/HDL Ratio: 4 Ratio
Triglycerides: 161 mg/dL — ABNORMAL HIGH (ref <150)
VLDL: 32 mg/dL (ref 0–40)

## 2013-01-16 NOTE — Progress Notes (Signed)
   Subjective:    Patient ID: Michele Lowe, female    DOB: 06-Feb-1951, 61 y.o.   MRN: [id number]  HPI Bilateral jaw pain with increasing frequency started with metformin use about 6 months ago, pt stopped metformin over 3 months ago, these symptoms are worsening Pain seems to start in left shoulder up her left neck to earlobe and jaw Yesterday occured on right jaw, first time ever. 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 Denies sinus pressure, nasal congestion, ear pain or sore throat. Denies chest congestion, productive cough or wheezing. Chronic leg swelling and new chest discomfort as above Denies abdominal pain, nausea, vomiting,diarrhea or constipation.   Denies dysuria, frequency, hesitancy or incontinence. Limitation in mobility due to morbid obesity Denies headaches, seizures, numbness, or tingling. Denies depression, anxiety or insomnia. Denies skin break down or rash.        Objective:   Physical Exam Patient alert and oriented and in no cardiopulmonary distress.  HEENT: No facial asymmetry, EOMI, no sinus tenderness,  oropharynx pink and moist.  Neck supple no adenopathy.  Chest: Clear to auscultation  bilaterally.No reproducible chest wall pain  CVS: S1, S2 no murmurs, no S3.  ABD: Soft mild epigastric discomfort. Bowel sounds normal.  Ext: No edema  MS: Adequate though reduced ROM spine, shoulders, hips and knees.  Skin: Intact, no ulcerations or rash noted.Slight swelling on right inner thigh but no palpable mass  Psych: Good eye contact, normal affect. Memory intact mildly  anxious or depressed appearing.  CNS: CN 2-12 intact, power, tone and sensation normal throughout.        Assessment & Plan:

## 2013-01-16 NOTE — Patient Instructions (Addendum)
F/u in 2.5 month, call if you need me before  You will be referred for brain scan, and for eye exam  You are being referred heart evaluation  Nexium 22.3 mg tab one daily for 2 weeks  Are being provided  for possible reflux symptoms .   HBA1C , lipid, cmp and EGFr , H pylori today  Reconsider the flu vaccine you need this  No mass is felt on your inner thigh, though the inner right thigh does appear slightly larger.US of the thigh will be ordered based on your concern

## 2013-01-18 ENCOUNTER — Other Ambulatory Visit: Payer: Self-pay | Admitting: Family Medicine

## 2013-01-18 MED ORDER — AMOXICILL-CLARITHRO-LANSOPRAZ PO MISC: Freq: Two times a day (BID) | ORAL | Status: DC

## 2013-01-19 DIAGNOSIS — R768 Other specified abnormal immunological findings in serum: Secondary | ICD-10-CM | POA: Insufficient documentation

## 2013-01-19 MED ORDER — LANSOPRAZOLE 30 MG PO TBDP: 30.0000 mg | ORAL_TABLET | Freq: Two times a day (BID) | ORAL | Status: DC

## 2013-01-19 MED ORDER — CLARITHROMYCIN 500 MG PO TABS: 500.0000 mg | ORAL_TABLET | Freq: Two times a day (BID) | ORAL | Status: AC

## 2013-01-19 MED ORDER — AMOXICILLIN 500 MG PO CAPS: ORAL_CAPSULE | ORAL | Status: AC

## 2013-01-22 ENCOUNTER — Other Ambulatory Visit: Payer: Self-pay

## 2013-01-22 MED ORDER — LANSOPRAZOLE 30 MG PO CPDR: 30.0000 mg | DELAYED_RELEASE_CAPSULE | Freq: Two times a day (BID) | ORAL | Status: DC

## 2013-01-24 ENCOUNTER — Encounter (HOSPITAL_COMMUNITY): Payer: Self-pay

## 2013-01-24 ENCOUNTER — Ambulatory Visit (HOSPITAL_COMMUNITY)
Admission: RE | Admit: 2013-01-24 | Discharge: 2013-01-24 | Disposition: A | Discharge status: Home / Self Care | Payer: Medicare FFS | Source: Ambulatory Visit | Attending: Family Medicine | Admitting: Family Medicine

## 2013-01-24 DIAGNOSIS — R51 Headache: Secondary | ICD-10-CM | POA: Insufficient documentation

## 2013-02-03 DIAGNOSIS — H5710 Ocular pain, unspecified eye: Secondary | ICD-10-CM | POA: Insufficient documentation

## 2013-02-03 DIAGNOSIS — R072 Precordial pain: Secondary | ICD-10-CM | POA: Insufficient documentation

## 2013-02-03 DIAGNOSIS — R2241 Localized swelling, mass and lump, right lower limb: Secondary | ICD-10-CM | POA: Insufficient documentation

## 2013-02-03 NOTE — Assessment & Plan Note (Signed)
New disabling headache , will scan brain, no focal neurologic deficits on exam

## 2013-02-03 NOTE — Assessment & Plan Note (Signed)
Controlled, no change in medication DASH diet and commitment to daily physical activity for a minimum of 30 minutes discussed and encouraged, as a part of hypertension management. The importance of attaining a healthy weight is also discussed.  

## 2013-02-03 NOTE — Assessment & Plan Note (Signed)
Unchanged Needs to commit to consistent lifestyle change to improve her  Health Patient educated about the importance of limiting  Carbohydrate intake , the need to commit to daily physical activity for a minimum of 30 minutes , and to commit weight loss. The fact that changes in all these areas will reduce or eliminate all together the development of diabetes is stressed.

## 2013-02-03 NOTE — Assessment & Plan Note (Signed)
Deteriorated. Patient re-educated about  the importance of commitment to a  minimum of 150 minutes of exercise per week. The importance of healthy food choices with portion control discussed. Encouraged to start a food diary, count calories and to consider  joining a support group. Sample diet sheets offered. Goals set by the patient for the next several months.    

## 2013-02-03 NOTE — Assessment & Plan Note (Signed)
Increased Cv status. Pt is aware of this and of the need to change ehavior to reduce risk

## 2013-02-03 NOTE — Assessment & Plan Note (Signed)
Dyspeptic symptoms with positive H pylori titer , first time being diagnosed, pt to be treated

## 2013-02-03 NOTE — Assessment & Plan Note (Signed)
Pt concerned about apparent fullness  And increased size of right inner thigh. No mass palpated however pt really very concerned may  request [place] of area of concern

## 2013-02-03 NOTE — Assessment & Plan Note (Signed)
2 month h/o right eye pain and headache , refer for eye exam

## 2013-02-03 NOTE — Assessment & Plan Note (Addendum)
Chest pain which is new in prediabetic morbidly obese female with HTN and dyslipidemia. Refer to cardiology for further evaluation

## 2013-02-05 ENCOUNTER — Encounter: Payer: Self-pay | Admitting: Family Medicine

## 2013-02-26 ENCOUNTER — Ambulatory Visit: Payer: Medicare FFS | Admitting: Cardiology

## 2013-02-28 ENCOUNTER — Ambulatory Visit: Payer: Medicare FFS | Admitting: Cardiology

## 2013-03-19 ENCOUNTER — Ambulatory Visit (INDEPENDENT_AMBULATORY_CARE_PROVIDER_SITE_OTHER): Payer: Medicare FFS | Admitting: Family Medicine

## 2013-03-19 ENCOUNTER — Encounter: Payer: Self-pay | Admitting: Cardiology

## 2013-03-19 ENCOUNTER — Encounter (INDEPENDENT_AMBULATORY_CARE_PROVIDER_SITE_OTHER): Payer: Self-pay

## 2013-03-19 ENCOUNTER — Ambulatory Visit (INDEPENDENT_AMBULATORY_CARE_PROVIDER_SITE_OTHER): Payer: Medicare FFS | Admitting: Cardiology

## 2013-03-19 ENCOUNTER — Encounter: Payer: Self-pay | Admitting: Family Medicine

## 2013-03-19 VITALS — BP 126/78 | HR 100 | Resp 18 | Ht 67.0 in | Wt 339.1 lb

## 2013-03-19 VITALS — BP 140/84 | HR 93 | Ht 67.0 in | Wt 339.5 lb

## 2013-03-19 DIAGNOSIS — J309 Allergic rhinitis, unspecified: Secondary | ICD-10-CM

## 2013-03-19 DIAGNOSIS — I10 Essential (primary) hypertension: Secondary | ICD-10-CM

## 2013-03-19 DIAGNOSIS — R7303 Prediabetes: Secondary | ICD-10-CM

## 2013-03-19 DIAGNOSIS — R7309 Other abnormal glucose: Secondary | ICD-10-CM

## 2013-03-19 DIAGNOSIS — E8881 Metabolic syndrome: Secondary | ICD-10-CM

## 2013-03-19 DIAGNOSIS — R072 Precordial pain: Secondary | ICD-10-CM

## 2013-03-19 DIAGNOSIS — K219 Gastro-esophageal reflux disease without esophagitis: Secondary | ICD-10-CM

## 2013-03-19 MED ORDER — POTASSIUM CHLORIDE CRYS ER 20 MEQ PO TBCR: 20.0000 meq | EXTENDED_RELEASE_TABLET | Freq: Every day | ORAL | Status: DC

## 2013-03-19 MED ORDER — TRIAMTERENE-HCTZ 37.5-25 MG PO TABS: ORAL_TABLET | ORAL | Status: DC

## 2013-03-19 MED ORDER — FUROSEMIDE 40 MG PO TABS
40.0000 mg | ORAL_TABLET | Freq: Every day | ORAL | Status: DC
Start: 2013-03-19 — End: 2013-10-31

## 2013-03-19 NOTE — Patient Instructions (Signed)
Your physician recommends that you schedule a follow-up appointment if needed after your treadmill  Your physician has requested that you have an exercise tolerance test. For further information please visit [email or web address]. Please also follow instruction sheet, as given.   Your physician recommends that you continue on your current medications as directed. Please refer to the Current Medication list given to you today.   Thanks for choosing [facility] !

## 2013-03-19 NOTE — Assessment & Plan Note (Signed)
Recent lipids reviewed, LDL 85. HDL low at 39.

## 2013-03-19 NOTE — Patient Instructions (Addendum)
F/u in 3 months   You will be referred to GI specialist about complaints of stomach pain    It is important that you exercise regularly at least 30 minutes 5 times a week. If you develop chest pain, have severe difficulty breathing, or feel very tired, stop exercising immediately and seek medical attention   A healthy diet is rich in fruit, vegetables and whole grains. Poultry fish, nuts and beans are a healthy choice for protein rather then red meat. A low sodium diet and drinking 64 ounces of water daily is generally recommended. Oils and sweet should be limited. Carbohydrates especially for those who are diabetic or overweight, should be limited to 34-45 gram per meal. It is important to eat on a regular schedule, at least 3 times daily. Snacks should be primarily fruits, vegetables or nuts.

## 2013-03-19 NOTE — Progress Notes (Signed)
Clinical Summary Ms. Wachs is a 63 y.o.female referred for cardiology consultation by Dr. Moshe Cipro. She reports a history of "indigestion" has been recurring intermittently over the last several months. She describes a pressure-like sensation in her upper chest, sometimes in the upper left arm and neck, also jaw area. No definite precipitant with particular foods. May feel more symptoms when she is upset, sometimes when she feels hungry and gets up at night time, although does not occur after she eats. Does not appear to be any definite exertional component based on her description.  She has been on Prevacid recently, and reports reducing symptoms. She has not been taking it regularly however.  ECG from December 2014 shows normal sinus rhythm. Lab work from December 2014 showed cholesterol 156, triglycerides 161, HDL 39, LDL 85, potassium 4.1, BUN 17, creatinine 0.8, normal AST and ALT, hemoglobin A1c 5.8.  Reports undergoing a treadmill test 10 years ago.   Allergies  Allergen Reactions  . Oxycodone Hcl Hives    Current Outpatient Prescriptions  Medication Sig Dispense Refill  . aspirin (ASPIRIN LOW DOSE) 81 MG EC tablet Take 81 mg by mouth daily.        . calcium-vitamin D (OSCAL WITH D) 500-200 MG-UNIT per tablet Take 1 tablet by mouth 3 (three) times daily.        Marland Kitchen Cod Liver Oil CAPS Take by mouth daily.        . furosemide (LASIX) 40 MG tablet Take 1 tablet (40 mg total) by mouth daily.  90 tablet  1  . lansoprazole (PREVACID) 30 MG capsule Take 1 capsule (30 mg total) by mouth 2 (two) times daily before a meal. Twice daily at 10am and 5 pm  28 capsule  0  . loratadine (CLARITIN) 10 MG tablet Take 1 tablet (10 mg total) by mouth daily.  90 tablet  1  . Multiple Vitamin (MULTIVITAMIN PO) Take by mouth daily.        . phentermine (ADIPEX-P) 37.5 MG tablet Take 37.5 mg by mouth daily before breakfast.      . potassium chloride SA (K-DUR,KLOR-CON) 20 MEQ tablet Take 1 tablet (20  mEq total) by mouth daily.  90 tablet  1  . sennosides-docusate sodium (SENOKOT-S) 8.6-50 MG tablet Take 1 tablet by mouth daily.      Marland Kitchen triamterene-hydrochlorothiazide (MAXZIDE-25) 37.5-25 MG per tablet One and a half tablets once daily.  135 tablet  1  . vitamin C (ASCORBIC ACID) 500 MG tablet Take 500 mg by mouth daily.       No current facility-administered medications for this visit.    Past Medical History  Diagnosis Date  . Morbid obesity   . Rhinosinusitis   . Allergy   . Arthritis   . Essential hypertension, benign 2009    Past Surgical History  Procedure Laterality Date  . Total knee arthroplasty  2007    Right   . Rt. forearm surgery for nerve repair  1989  . Partial hysterectomy ,secondary to fibroid . pt still have both ovaries  1978  . Joint replacement  05/13/2011    Revision of left knee replacement in 2008  . Abdominal hysterectomy      Partial     Family History  Problem Relation Age of Onset  . Heart failure Mother   . Hypertension Mother   . Heart disease Mother     Irregular heart valve  . Hypertension Father   . Emphysema Father   .  Heart disease Father   . COPD Father   . Hypertension Sister   . Hypertension Brother   . Hypertension Brother   . Diabetes Brother   . Diabetes Brother   . Diabetes Brother   . Diabetes Brother   . Diabetes Brother   . Diabetes Brother     Social History Ms. Callaway reports that she has never smoked. She does not have any smokeless tobacco history on file. Ms. Kronberg reports that she does not drink alcohol.  Review of Systems No palpitations, dizziness, syncope. Stable appetite. No obvious melena or hematochezia. Otherwise negative.  Physical Examination Filed Vitals:   03/19/13 1449  BP: 140/84  Pulse: 93   Filed Weights   03/19/13 1449  Weight: 339 lb 8 oz (153.996 kg)   Obese woman, appears chronic but rest. HEENT: Conjunctiva and lids normal, oropharynx clear. Neck: Supple, increased girth  without obvious elevated JVP or carotid bruits, no thyromegaly. Lungs: Clear to auscultation, nonlabored breathing at rest. Cardiac: Regular rate and rhythm, no S3 or significant systolic murmur, no pericardial rub. Abdomen: Soft, nontender, bowel sounds present. Extremities: No pitting edema, distal pulses 2+. Skin: Warm and dry. Musculoskeletal: No kyphosis. Neuropsychiatric: Alert and oriented x3, affect grossly appropriate.   Problem List and Plan   Precordial pain Symptoms may well be indigestion. She does report some improvement on as needed Prevacid. I wonder if her symptoms would resolve completely if she took the medicine every day. Cardiac risk factors include obesity, hypertension, and prediabetes. Baseline ECG is normal. Reasonable to proceed with followup risk stratification via GXT. If this is reassuring, I would agree with regular exercise regimen and risk factor modification. If abnormal, we can pursue further workup.  Prediabetes Keep followup with Dr. Moshe Cipro.  Metabolic syndrome X Recent lipids reviewed, LDL 85. HDL low at 39.    Satira Sark, M.D., F.A.C.C.

## 2013-03-19 NOTE — Assessment & Plan Note (Signed)
Keep followup with Dr. Simpson. 

## 2013-03-19 NOTE — Progress Notes (Signed)
Subjective:    Patient ID: Michele Lowe, female    DOB: May 25, 1950, 63 y.o.   MRN: [id number]  HPI  Intermittent 5 month h/o substernal chest discomfort which at times is relieved with taking a PPI, generally on an empty stomach, pain was radiating to left chest and left jaw, now radiaiting to right. Has appt with cardiology today. Denies that food is sticking , experiences the pain as a spasm, of note  At time of pain she is neither eating or drinking anything Notes new stresses in her life since October, caring for a disabled young man to help her daughter , also stress at the [employer] which she belongs to. Continues to work on weight loss through behavior modification with the help of phentermine   Review of Systems See HPI Denies recent fever or chills. Denies sinus pressure, nasal congestion, ear pain or sore throat. Denies chest congestion, productive cough or wheezing. Denies  palpitations , PND or orthopnea, she has chronic leg swelling Denies  vomiting,diarrhea or constipation.  Increased and uncontrolled GERd symptoms with some dysphagia Denies dysuria, frequency, hesitancy or incontinence. C/o joint pain, swelling and limitation in mobility. Denies headaches, seizures, numbness, or tingling. Denies depression, anxiety or insomnia. Denies skin break down or rash.        Objective:   Physical Exam BP 126/78  Pulse 100  Resp 18  Ht 5\' 7"  (1.702 m)  Wt 339 lb 1.9 oz (153.824 kg)  BMI 53.10 kg/m2  SpO2 96% Patient alert and oriented and in no cardiopulmonary distress.  HEENT: No facial asymmetry, EOMI, no sinus tenderness,  oropharynx pink and moist.  Neck supple no adenopathy.  Chest: Clear to auscultation bilaterally.  CVS: S1, S2 no murmurs, no S3.  ABD: Soft mild epigastric tenderness, no guarding or rebpound Bowel sounds normal.  Ext: No edema  MS: Adequate ROM spine, shoulders, hips and knees.  Skin: Intact, no ulcerations or rash noted.  Psych:  Good eye contact, normal affect. Memory intact not anxious or depressed appearing.  CNS: CN 2-12 intact, power, tone and sensation normal throughout.        Assessment & Plan:  GERD (gastroesophageal reflux disease) C/o increased and uncontrolled symptoms despite being treated recently for H pylori disease, refer to GI for furhter eval  UNSPECIFIED ESSENTIAL HYPERTENSION Controlled, no change in medication DASH diet and commitment to daily physical activity for a minimum of 30 minutes discussed and encouraged, as a part of hypertension management. The importance of attaining a healthy weight is also discussed.   MORBID OBESITY Improved. Pt applauded on succesful weight loss through lifestyle change, and encouraged to continue same. Weight loss goal set for the next several months.   Metabolic syndrome X The increased risk of cardiovascular disease associated with this diagnosis, and the need to consistently work on lifestyle to change this is discussed. Following  a  heart healthy diet ,commitment to 30 minutes of exercise at least 5 days per week, as well as control of blood sugar and cholesterol , and achieving a healthy weight are all the areas to be addressed .   Prediabetes Improved Patient educated about the importance of limiting  Carbohydrate intake , the need to commit to daily physical activity for a minimum of 30 minutes , and to commit weight loss. The fact that changes in all these areas will reduce or eliminate all together the development of diabetes is stressed.     ALLERGIC RHINITIS CAUSE UNSPECIFIED Controlled, no  change in medication

## 2013-03-19 NOTE — Assessment & Plan Note (Signed)
Symptoms may well be indigestion. She does report some improvement on as needed Prevacid. I wonder if her symptoms would resolve completely if she took the medicine every day. Cardiac risk factors include obesity, hypertension, and prediabetes. Baseline ECG is normal. Reasonable to proceed with followup risk stratification via GXT. If this is reassuring, I would agree with regular exercise regimen and risk factor modification. If abnormal, we can pursue further workup.

## 2013-03-20 ENCOUNTER — Telehealth: Payer: Self-pay | Admitting: Family Medicine

## 2013-03-20 NOTE — Telephone Encounter (Signed)
Yes ok 

## 2013-03-20 NOTE — Telephone Encounter (Signed)
Please advise.  It is noted that she saw Cardiology on 2/2

## 2013-03-20 NOTE — Telephone Encounter (Signed)
Left message for patient to return call.

## 2013-03-21 ENCOUNTER — Ambulatory Visit (HOSPITAL_COMMUNITY): Admission: RE | Admit: 2013-03-21 | Payer: Medicare FFS | Source: Ambulatory Visit

## 2013-03-21 ENCOUNTER — Other Ambulatory Visit: Payer: Self-pay | Admitting: Adult Health

## 2013-03-21 DIAGNOSIS — R079 Chest pain, unspecified: Secondary | ICD-10-CM

## 2013-03-21 MED ORDER — REGADENOSON 0.4 MG/5ML IV SOLN: 0.4000 mg | Freq: Once | INTRAVENOUS | Status: DC

## 2013-03-22 NOTE — Telephone Encounter (Signed)
Left message notifying patient that it is ok for her to take prevacid

## 2013-03-26 ENCOUNTER — Other Ambulatory Visit (HOSPITAL_COMMUNITY): Payer: Medicare FFS

## 2013-04-02 ENCOUNTER — Encounter (HOSPITAL_COMMUNITY)
Admission: RE | Admit: 2013-04-02 | Discharge: 2013-04-02 | Disposition: A | Discharge status: Home / Self Care | Payer: Medicare FFS | Source: Ambulatory Visit | Attending: Adult Health | Admitting: Adult Health

## 2013-04-02 ENCOUNTER — Encounter (HOSPITAL_COMMUNITY): Payer: Self-pay

## 2013-04-02 DIAGNOSIS — R079 Chest pain, unspecified: Secondary | ICD-10-CM | POA: Insufficient documentation

## 2013-04-02 DIAGNOSIS — R072 Precordial pain: Secondary | ICD-10-CM

## 2013-04-02 MED ORDER — TECHNETIUM TC 99M SESTAMIBI - CARDIOLITE
30.0000 | Freq: Once | INTRAVENOUS | Status: AC | PRN
  Administered 2013-04-02: 11:00:00 30 via INTRAVENOUS

## 2013-04-02 MED ORDER — REGADENOSON 0.4 MG/5ML IV SOLN
INTRAVENOUS | Status: AC
  Administered 2013-04-02: 0.4 mg via INTRAVENOUS
  Filled 2013-04-02: qty 5

## 2013-04-02 MED ORDER — TECHNETIUM TC 99M SESTAMIBI GENERIC - CARDIOLITE
10.0000 | Freq: Once | INTRAVENOUS | Status: AC | PRN
  Administered 2013-04-02: 10 via INTRAVENOUS

## 2013-04-02 MED ORDER — SODIUM CHLORIDE 0.9 % IJ SOLN
INTRAMUSCULAR | Status: AC
  Administered 2013-04-02: 10 mL via INTRAVENOUS
  Filled 2013-04-02: qty 10

## 2013-04-02 NOTE — Progress Notes (Signed)
Stress Lab Nurses Notes - [place] 04/02/2013 Reason for doing test: precordial pain Type of test: [name] Nurse performing test: Gerrit Halls, RN Nuclear Medicine Tech: Redmond Baseman Echo Tech: Not Applicable MD performing test: Koneswaran/K.Lawrence NP Family MD: Moshe Cipro Test explained and consent signed: yes IV started: 22g jelco, Saline lock flushed, No redness or edema and Saline lock started in radiology Symptoms: Flushed in face & chest pressure Treatment/Intervention: None Reason test stopped: protocol completed After recovery IV was: Discontinued via X-ray tech and No redness or edema Patient to return to [place]. Med at : 11:15 Patient discharged: Home Patient's Condition upon discharge was: stable Comments: During test 114/67 & HR 109.  Recovery BP 130/66 & HR 94.  Symptoms resolved in recovery. [name] T

## 2013-04-04 ENCOUNTER — Ambulatory Visit: Payer: PRIVATE HEALTH INSURANCE | Admitting: Family Medicine

## 2013-04-12 DIAGNOSIS — K219 Gastro-esophageal reflux disease without esophagitis: Secondary | ICD-10-CM | POA: Insufficient documentation

## 2013-04-12 NOTE — Assessment & Plan Note (Signed)
Controlled, no change in medication DASH diet and commitment to daily physical activity for a minimum of 30 minutes discussed and encouraged, as a part of hypertension management. The importance of attaining a healthy weight is also discussed.  

## 2013-04-12 NOTE — Assessment & Plan Note (Signed)
Improved. Pt applauded on succesful weight loss through lifestyle change, and encouraged to continue same. Weight loss goal set for the next several months.  

## 2013-04-12 NOTE — Assessment & Plan Note (Signed)
C/o increased and uncontrolled symptoms despite being treated recently for H pylori disease, refer to GI for furhter eval

## 2013-04-12 NOTE — Assessment & Plan Note (Signed)
The increased risk of cardiovascular disease associated with this diagnosis, and the need to consistently work on lifestyle to change this is discussed. Following  a  heart healthy diet ,commitment to 30 minutes of exercise at least 5 days per week, as well as control of blood sugar and cholesterol , and achieving a healthy weight are all the areas to be addressed .  

## 2013-04-12 NOTE — Assessment & Plan Note (Signed)
Improved Patient educated about the importance of limiting  Carbohydrate intake , the need to commit to daily physical activity for a minimum of 30 minutes , and to commit weight loss. The fact that changes in all these areas will reduce or eliminate all together the development of diabetes is stressed.

## 2013-04-12 NOTE — Assessment & Plan Note (Signed)
Controlled, no change in medication  

## 2013-04-13 ENCOUNTER — Encounter: Payer: Self-pay | Admitting: Gastroenterology

## 2013-05-02 ENCOUNTER — Ambulatory Visit: Payer: PRIVATE HEALTH INSURANCE | Admitting: Gastroenterology

## 2013-06-18 LAB — HEMOGLOBIN A1C
HEMOGLOBIN A1C: 5.7 % — AB (ref <5.7)
Mean Plasma Glucose: 117 mg/dL — ABNORMAL HIGH (ref <117)

## 2013-06-19 ENCOUNTER — Telehealth: Payer: Self-pay | Admitting: Family Medicine

## 2013-06-19 DIAGNOSIS — I10 Essential (primary) hypertension: Secondary | ICD-10-CM

## 2013-06-19 MED ORDER — TRIAMTERENE-HCTZ 37.5-25 MG PO TABS: ORAL_TABLET | ORAL | Status: DC

## 2013-06-19 NOTE — Telephone Encounter (Signed)
[facility]

## 2013-06-20 ENCOUNTER — Ambulatory Visit (INDEPENDENT_AMBULATORY_CARE_PROVIDER_SITE_OTHER): Payer: Medicare FFS | Admitting: Family Medicine

## 2013-06-20 ENCOUNTER — Encounter (INDEPENDENT_AMBULATORY_CARE_PROVIDER_SITE_OTHER): Payer: Self-pay

## 2013-06-20 ENCOUNTER — Encounter: Payer: Self-pay | Admitting: Family Medicine

## 2013-06-20 ENCOUNTER — Telehealth (HOSPITAL_COMMUNITY): Payer: Self-pay | Admitting: Dietician

## 2013-06-20 VITALS — BP 130/78 | HR 96 | Resp 16 | Wt 347.0 lb

## 2013-06-20 DIAGNOSIS — K219 Gastro-esophageal reflux disease without esophagitis: Secondary | ICD-10-CM

## 2013-06-20 DIAGNOSIS — E8881 Metabolic syndrome: Secondary | ICD-10-CM

## 2013-06-20 DIAGNOSIS — R7303 Prediabetes: Secondary | ICD-10-CM

## 2013-06-20 DIAGNOSIS — R7309 Other abnormal glucose: Secondary | ICD-10-CM

## 2013-06-20 DIAGNOSIS — R5383 Other fatigue: Secondary | ICD-10-CM

## 2013-06-20 DIAGNOSIS — R5381 Other malaise: Secondary | ICD-10-CM

## 2013-06-20 DIAGNOSIS — J309 Allergic rhinitis, unspecified: Secondary | ICD-10-CM

## 2013-06-20 DIAGNOSIS — I10 Essential (primary) hypertension: Secondary | ICD-10-CM

## 2013-06-20 MED ORDER — FLUTICASONE PROPIONATE 50 MCG/ACT NA SUSP: 2.0000 | Freq: Every day | NASAL | Status: DC

## 2013-06-20 MED ORDER — PHENTERMINE HCL 37.5 MG PO TABS: ORAL_TABLET | ORAL | Status: DC

## 2013-06-20 MED ORDER — POTASSIUM CHLORIDE CRYS ER 20 MEQ PO TBCR: 20.0000 meq | EXTENDED_RELEASE_TABLET | Freq: Every day | ORAL | Status: DC

## 2013-06-20 MED ORDER — TRIAMTERENE-HCTZ 37.5-25 MG PO TABS: ORAL_TABLET | ORAL | Status: DC

## 2013-06-20 NOTE — Telephone Encounter (Signed)
Received referral via fax from Dr. Moshe Cipro for dx: obesity, prediabetes.

## 2013-06-20 NOTE — Patient Instructions (Addendum)
Annual physical exam in 4 month, call if you need me before  You will be referred for individual nutrition counseling   Please continue to commit to exercise at the [facility]  flonase daily for allergies  Blood sugar is nearly normal  Weight loss goal of 5 to 8 pounds    Phentermine half daily to help with weight control  Non fasting CBC, hBA1C, chem 7 and TSH in 4 month

## 2013-06-21 NOTE — Telephone Encounter (Signed)
Called and left message at [phone] requesting call back.

## 2013-06-22 NOTE — Telephone Encounter (Signed)
Sent letter to pt home via [employer] Mail in attempt to contact pt to schedule appointment.  

## 2013-06-27 NOTE — Telephone Encounter (Signed)
Pt has not responded to attempts to contact to schedule appointment. Referral filed.  

## 2013-07-10 ENCOUNTER — Telehealth (HOSPITAL_COMMUNITY): Payer: Self-pay | Admitting: Dietician

## 2013-07-10 NOTE — Telephone Encounter (Signed)
Received voicemail left by pt at [phone]. Pt wants to schedule appointment. Called back at 1246 and offered appt on 07/23/13 at 1100. Pt called back and confirmed appointment.

## 2013-07-23 ENCOUNTER — Encounter (HOSPITAL_COMMUNITY): Payer: Self-pay | Admitting: Dietician

## 2013-07-23 NOTE — Progress Notes (Signed)
Outpatient Initial Nutrition Assessment  Date:07/23/2013   Appt Start Time: 39  Referring Physician: Dr. Moshe Cipro Reason for Visit: obesity, prediabetes  Nutrition Assessment:  Height: _0  (170.2 cm)   Weight: 345 lb (156.491 kg)   IBW: 135#  %IBW: 256% UBW: 233#  %UBW: 148% Body mass index is 54.02 kg/(m^2). Meets criteria for extreme obesity, class II. Goal Weight: 310# (10% loss of current body weight) Weight hx: Wt Readings from Last 10 Encounters:  06/20/13 347 lb (157.398 kg)  03/19/13 339 lb 8 oz (153.996 kg)  03/19/13 339 lb 1.9 oz (153.824 kg)  01/16/13 344 lb (156.037 kg)  09/27/12 336 lb (152.409 kg)  05/25/12 346 lb (156.945 kg)  01/25/12 344 lb 0.6 oz (156.056 kg)  10/19/11 337 lb 0.6 oz (152.88 kg)  06/07/11 324 lb 0.6 oz (146.984 kg)  03/23/11 337 lb (152.862 kg)    Estimated nutritional needs:  Kcals/ day: 2300-2500 Protein (grams)/day: 125-157 Fluid (L)/ day: 2.3-2.5  PMH:  Past Medical History  Diagnosis Date  . Morbid obesity   . Rhinosinusitis   . Allergy   . Arthritis   . Essential hypertension, benign 2009    Medications:  Current Outpatient Rx  Name  Route  Sig  Dispense  Refill  . aspirin (ASPIRIN LOW DOSE) 81 MG EC tablet   Oral   Take 81 mg by mouth daily.           . calcium-vitamin D (OSCAL WITH D) 500-200 MG-UNIT per tablet   Oral   Take 1 tablet by mouth 3 (three) times daily.           Marland Kitchen Cod Liver Oil CAPS   Oral   Take by mouth daily.           . fluticasone (FLONASE) 50 MCG/ACT nasal spray   Each Nare   Place 2 sprays into both nostrils daily.   48 g   1   . furosemide (LASIX) 40 MG tablet   Oral   Take 1 tablet (40 mg total) by mouth daily.   90 tablet   1   . Glucosamine-Chondroit-Vit C-Mn (GLUCOSAMINE 1500 COMPLEX PO)   Oral   Take 1 capsule by mouth daily.         . lansoprazole (PREVACID) 30 MG capsule   Oral   Take 1 capsule (30 mg total) by mouth 2 (two) times daily before a meal. Twice daily  at 10am and 5 pm   28 capsule   0   . loratadine (CLARITIN) 10 MG tablet   Oral   Take 1 tablet (10 mg total) by mouth daily.   90 tablet   1   . Multiple Vitamin (MULTIVITAMIN PO)   Oral   Take by mouth daily.           . phentermine (ADIPEX-P) 37.5 MG tablet      Half tablet every morning with breakfast   15 tablet   3   . potassium chloride SA (K-DUR,KLOR-CON) 20 MEQ tablet   Oral   Take 1 tablet (20 mEq total) by mouth daily.   90 tablet   1   . triamterene-hydrochlorothiazide (MAXZIDE-25) 37.5-25 MG per tablet      One and a half tablets once daily.   135 tablet   1   . vitamin C (ASCORBIC ACID) 500 MG tablet   Oral   Take 500 mg by mouth daily.  Labs: CMP     Component Value Date/Time   NA 140 01/16/2013 1204   K 4.1 01/16/2013 1204   CL 101 01/16/2013 1204   CO2 33* 01/16/2013 1204   GLUCOSE 93 01/16/2013 1204   BUN 17 01/16/2013 1204   CREATININE 0.84 01/16/2013 1204   CREATININE 0.82 05/05/2010 1503   CALCIUM 9.4 01/16/2013 1204   PROT 7.3 01/16/2013 1204   ALBUMIN 4.1 01/16/2013 1204   AST 25 01/16/2013 1204   ALT 27 01/16/2013 1204   ALKPHOS 116 01/16/2013 1204   BILITOT 0.6 01/16/2013 1204   GFRNONAA 74 01/16/2013 1204   GFRNONAA >60 05/05/2010 1503   GFRAA 86 01/16/2013 1204   GFRAA  Value: >60        The eGFR has been calculated using the MDRD equation. This calculation has not been validated in all clinical situations. eGFR's persistently <60 mL/min signify possible Chronic Kidney Disease. 05/05/2010 1503    Lipid Panel     Component Value Date/Time   CHOL 156 01/16/2013 1204   TRIG 161* 01/16/2013 1204   HDL 39* 01/16/2013 1204   CHOLHDL 4.0 01/16/2013 1204   VLDL 32 01/16/2013 1204   LDLCALC 85 01/16/2013 1204     Lab Results  Component Value Date   HGBA1C 5.7* 06/18/2013   HGBA1C 5.8* 01/16/2013   HGBA1C 6.1* 05/18/2012   Lab Results  Component Value Date   LDLCALC 85 01/16/2013   CREATININE 0.84 01/16/2013     Lifestyle/ social  habits: Ms. Arps resides in [place], [place]. Occupation: retired.  Physical activity: 2 hours 5 times per week. Pt participates in water aerobics and water rehab 3 times per week and water rehab and Silver Sneakers 2 times per week at the [facility]. She also is contemplating swimming lessons and riding the stationary bike to vary her routine. Her long time goal is to get a bicycle so she can continue to stay active.   Nutrition hx/habits: Ms. Hoefer has struggled with her weight most of her life. She reports he lowest weighr was 233# 20 years ago. She reports she was unable to maintain the weight loss, which she achieved by cutting back on portions. She has never exercised regularly up until approximately 2 months ago, when she started taking exercise classes at the [facility]. She has been referred here in the past, but did not make an appointment because, she admits that she had not been making her health a priority. Now, she is concerned about developing diabetes and her overall quality of life. She reports that she knows she will never be "skinny" but desires to continue to live a healthy lifestyle to improve her overall quality of life. She shop mainly at [employer] and portions and freezes food, however, due to her busy lifestyle she often does not make the best food choices. She enjoys cooking and she has recently changed her cooking habits, by baking herbed chicken in the over and using lemon juice to flavor her fish. She has decreased fried food to once every 2 weeks.   Diet recall: Breakfast: cereal OR yogurt with soy milk and cereal; Lunch: meat, starch, vegetable; Dinner: meat, starch, and vegetable; Snacks: none; Beverages mainly consist of sweet tea and water  Nutrition Diagnosis: Nutrition-related knowledge deficit r/t no recent diet education AEB Hgb A1c: 5.7.   Nutrition Intervention: Nutrition rx: 1800-2000 Kcal NAS, diabetic diet; 3 meals per day; no snacks snacks; low calorie  beverages only; Physical activity minimum of 2.5 hours  weekly  Education/Counseling Provided: Educated pt on principles of diabetic diet. Discussed carbohydrate metabolism in relation to diabetes. Educated pt on basic self-management principles including: signs and symptoms of hyperglycemia and hypoglycemia, goals for fasting and postprandial blood sugars, goals for Hgb A1c, importance of checking feet, importance of keeping PCP appointments, and foot care. Educated pt on plate method, portion sizes, and sources of carbohydrate. Discussed importance of regular meal pattern. Discussed importance of adding sources of whole grains to diet to improve glycemic control. Also encouraged to choose low fat dairy, lean meats, and whole fruits and vegetables more often. Discussed options of artificial sweeteners and encouraged pt to use which brand she liked best. Discussed nutritional content of foods commonly eaten and discussed healthier alternatives. Discussed importance of compliance to prevent further complications of disease. Educated pt on importance of physical activity (goal of at least 30 minutes 5 times per week) along with a healthy diet to achieve weight loss and glycemic goals. Encouraged slow, moderate weight loss of 1-2# per week, or 7-10% of current body weight. Provided "Weight Loss Tips" and "Carbohydrate Counting and Meal Planning" handouts. Used TeachBack to assess understanding.   Understanding, Motivation, Ability to Follow Recommendations: Expect fair to good compliance.   Monitoring and Evaluation: Goals: 1) 0.5-2# wt loss per week; 2) Physical activity minimum of 2.5 hours per week  Recommendations: 1) Continue to vary exercise routine and commitment; 2) Keep food diary; 3) Continue to plan and prepare meals in advance; 4) Continue with low calorie beverages  F/U: PRN. Provided RD contact information.   Zamaria Brazzle A. Jimmye Norman, RD, LDN 07/23/2013  Appt EndTime: 6060

## 2013-08-18 NOTE — Assessment & Plan Note (Signed)
Improved Patient educated about the importance of limiting  Carbohydrate intake , the need to commit to daily physical activity for a minimum of 30 minutes , and to commit weight loss. The fact that changes in all these areas will reduce or eliminate all together the development of diabetes is stressed.    

## 2013-08-18 NOTE — Progress Notes (Signed)
Subjective:    Patient ID: Michele Lowe, female    DOB: [birth year], 63 y.o.   MRN: [id number]  HPI The PT is here for follow up and re-evaluation of chronic medical conditions, medication management and review of any available recent lab and radiology data.  Preventive health is updated, specifically  Cancer screening and Immunization.   Was evaluated by cardiology since last visit, no new dx , no med change The PT denies any adverse reactions to current medications since the last visit.  C/o increased and uncontrolled allergy symptoms, excessive nasal congestion with clear nasal drainage which is clear also sneezing and watery eyes, no fever or chills, symptoms worse in past month with change in season       Review of Systems See HPI  Denies  ear pain or sore throat. Denies chest congestion, productive cough or wheezing. Denies chest pains, palpitations and leg swelling Denies abdominal pain, nausea, vomiting,diarrhea or constipation.   Denies dysuria, frequency, hesitancy or incontinence. Denies uncontrolled  joint pain, swelling and limitation in mobility. Denies headaches, seizures, numbness, or tingling. Denies depression, anxiety or insomnia. Denies skin break down or rash.        Objective:   Physical Exam BP 130/78  Pulse 96  Resp 16  Wt 347 lb (157.398 kg)  SpO2 97% Patient alert and oriented and in no cardiopulmonary distress.  HEENT: No facial asymmetry, EOMI,   oropharynx pink and moist.  Neck supple no JVD, no mass. Erythema and edema of nasal mucosa and conjunctival erythema with excessive watering Chest: Clear to auscultation bilaterally.  CVS: S1, S2 no murmurs, no S3.  ABD: Soft non tender.   Ext: No edema  MS: Adequate ROM spine, shoulders, hips and knees.  Skin: Intact, no ulcerations or rash noted.  Psych: Good eye contact, normal affect. Memory intact not anxious or depressed appearing.  CNS: CN 2-12 intact, power,  normal  throughout.no focal deficits noted.        Assessment & Plan:  UNSPECIFIED ESSENTIAL HYPERTENSION Controlled, no change in medication DASH diet and commitment to daily physical activity for a minimum of 30 minutes discussed and encouraged, as a part of hypertension management. The importance of attaining a healthy weight is also discussed.   ALLERGIC RHINITIS CAUSE UNSPECIFIED Uncontrolled, pt to commit to daily nasal spray, and avoidance of trigger as much as possible  Prediabetes Improved  Patient educated about the importance of limiting  Carbohydrate intake , the need to commit to daily physical activity for a minimum of 30 minutes , and to commit weight loss. The fact that changes in all these areas will reduce or eliminate all together the development of diabetes is stressed.     MORBID OBESITY Deteriorated. Patient re-educated about  the importance of commitment to a  minimum of 150 minutes of exercise per week. The importance of healthy food choices with portion control discussed. Encouraged to start a food diary, count calories and to consider  joining a support group. Sample diet sheets offered. Goals set by the patient for the next several months.     Metabolic syndrome X The increased risk of cardiovascular disease associated with this diagnosis, and the need to consistently work on lifestyle to change this is discussed. Following  a  heart healthy diet ,commitment to 30 minutes of exercise at least 5 days per week, as well as control of blood sugar and cholesterol , and achieving a healthy weight are all the areas to be  addressed .   GERD (gastroesophageal reflux disease) Controlled, no change in medication

## 2013-08-18 NOTE — Assessment & Plan Note (Signed)
The increased risk of cardiovascular disease associated with this diagnosis, and the need to consistently work on lifestyle to change this is discussed. Following  a  heart healthy diet ,commitment to 30 minutes of exercise at least 5 days per week, as well as control of blood sugar and cholesterol , and achieving a healthy weight are all the areas to be addressed .  

## 2013-08-18 NOTE — Assessment & Plan Note (Signed)
Deteriorated. Patient re-educated about  the importance of commitment to a  minimum of 150 minutes of exercise per week. The importance of healthy food choices with portion control discussed. Encouraged to start a food diary, count calories and to consider  joining a support group. Sample diet sheets offered. Goals set by the patient for the next several months.    

## 2013-08-18 NOTE — Assessment & Plan Note (Signed)
Controlled, no change in medication DASH diet and commitment to daily physical activity for a minimum of 30 minutes discussed and encouraged, as a part of hypertension management. The importance of attaining a healthy weight is also discussed.  

## 2013-08-18 NOTE — Assessment & Plan Note (Signed)
Controlled, no change in medication  

## 2013-08-18 NOTE — Assessment & Plan Note (Signed)
Uncontrolled, pt to commit to daily nasal spray, and avoidance of trigger as much as possible

## 2013-10-20 LAB — CBC WITH DIFFERENTIAL/PLATELET
Basophils Absolute: 0 10*3/uL (ref 0.0–0.1)
Basophils Relative: 0 % (ref 0–1)
EOS ABS: 0.1 10*3/uL (ref 0.0–0.7)
EOS PCT: 1 % (ref 0–5)
HCT: 39.2 % (ref 36.0–46.0)
Hemoglobin: 13.1 g/dL (ref 12.0–15.0)
LYMPHS PCT: 38 % (ref 12–46)
Lymphs Abs: 2.3 10*3/uL (ref 0.7–4.0)
MCH: 27.7 pg (ref 26.0–34.0)
MCHC: 33.4 g/dL (ref 30.0–36.0)
MCV: 82.9 fL (ref 78.0–100.0)
Monocytes Absolute: 0.5 10*3/uL (ref 0.1–1.0)
Monocytes Relative: 8 % (ref 3–12)
Neutro Abs: 3.2 10*3/uL (ref 1.7–7.7)
Neutrophils Relative %: 53 % (ref 43–77)
PLATELETS: 267 10*3/uL (ref 150–400)
RBC: 4.73 MIL/uL (ref 3.87–5.11)
RDW: 14.6 % (ref 11.5–15.5)
WBC: 6.1 10*3/uL (ref 4.0–10.5)

## 2013-10-20 LAB — COMPREHENSIVE METABOLIC PANEL
ALBUMIN: 4 g/dL (ref 3.5–5.2)
ALK PHOS: 106 U/L (ref 39–117)
ALT: 26 U/L (ref 0–35)
AST: 26 U/L (ref 0–37)
BILIRUBIN TOTAL: 0.5 mg/dL (ref 0.2–1.2)
BUN: 12 mg/dL (ref 6–23)
CO2: 31 mEq/L (ref 19–32)
Calcium: 9.1 mg/dL (ref 8.4–10.5)
Chloride: 100 mEq/L (ref 96–112)
Creat: 0.67 mg/dL (ref 0.50–1.10)
GLUCOSE: 92 mg/dL (ref 70–99)
Potassium: 4.1 mEq/L (ref 3.5–5.3)
Sodium: 139 mEq/L (ref 135–145)
Total Protein: 6.9 g/dL (ref 6.0–8.3)

## 2013-10-20 LAB — HEMOGLOBIN A1C
HEMOGLOBIN A1C: 6 % — AB (ref <5.7)
Mean Plasma Glucose: 126 mg/dL — ABNORMAL HIGH (ref <117)

## 2013-10-20 LAB — TSH: TSH: 1.052 u[IU]/mL (ref 0.350–4.500)

## 2013-10-31 ENCOUNTER — Ambulatory Visit (INDEPENDENT_AMBULATORY_CARE_PROVIDER_SITE_OTHER): Payer: Medicare FFS | Admitting: Family Medicine

## 2013-10-31 ENCOUNTER — Encounter: Payer: Self-pay | Admitting: Family Medicine

## 2013-10-31 ENCOUNTER — Encounter (INDEPENDENT_AMBULATORY_CARE_PROVIDER_SITE_OTHER): Payer: Self-pay

## 2013-10-31 VITALS — BP 138/74 | HR 94 | Resp 18 | Ht 67.0 in | Wt 338.0 lb

## 2013-10-31 DIAGNOSIS — Z23 Encounter for immunization: Secondary | ICD-10-CM | POA: Insufficient documentation

## 2013-10-31 DIAGNOSIS — Z1211 Encounter for screening for malignant neoplasm of colon: Secondary | ICD-10-CM

## 2013-10-31 DIAGNOSIS — R7303 Prediabetes: Secondary | ICD-10-CM

## 2013-10-31 DIAGNOSIS — I10 Essential (primary) hypertension: Secondary | ICD-10-CM

## 2013-10-31 DIAGNOSIS — Z0000 Encounter for general adult medical examination without abnormal findings: Secondary | ICD-10-CM

## 2013-10-31 LAB — HEMOCCULT GUIAC POC 1CARD (OFFICE): FECAL OCCULT BLD: NEGATIVE

## 2013-10-31 MED ORDER — TRIAMTERENE-HCTZ 37.5-25 MG PO TABS: ORAL_TABLET | ORAL | Status: DC

## 2013-10-31 MED ORDER — FUROSEMIDE 40 MG PO TABS: 40.0000 mg | ORAL_TABLET | Freq: Every day | ORAL | Status: DC

## 2013-10-31 MED ORDER — PHENTERMINE HCL 37.5 MG PO TABS: ORAL_TABLET | ORAL | Status: DC

## 2013-10-31 MED ORDER — POTASSIUM CHLORIDE CRYS ER 20 MEQ PO TBCR: 20.0000 meq | EXTENDED_RELEASE_TABLET | Freq: Every day | ORAL | Status: DC

## 2013-10-31 NOTE — Patient Instructions (Signed)
F/u in 4 month, call if you need me before  CONGRATS on excellent weight loss, and GREAT health habits as far as commitment to regular physical activity, and low carb diet are concerned  Blood sugar is a bit higher this time so PLEASE watch sweets and carbs  Chem 7 non fast and hBA1C in 4 months pls  If you decide on the flu vaccine we will be HAPPY to administer!  All the best for the rest of the year  I recommend that you have either your vascular Doc or your orthopedic surgeon evaluate the area of conceern on you rigth inner thigh when you next see them

## 2013-11-02 NOTE — Assessment & Plan Note (Signed)
Vaccine administered at visit.  

## 2013-11-02 NOTE — Progress Notes (Signed)
   Subjective:    Patient ID: Michele Lowe, female    DOB: [birth year], 63 y.o.   MRN: [id number]  HPI Patient is in for annual physical exam. No other health concerns are expressed or addressed at the visit. Immunization is updated   Review of Systems See HPI     Objective:   Physical Exam BP 138/74  Pulse 94  Resp 18  Ht 5\' 7"  (1.702 m)  Wt 338 lb (153.316 kg)  BMI 52.93 kg/m2  SpO2 98% Pleasant morbidly obese female, alert and oriented x 3, in no cardio-pulmonary distress. Afebrile. HEENT No facial trauma or asymetry. Sinuses non tender.  EOMI, PERTL,   External ears normal, tympanic membranes clear. Oropharynx moist, no exudate,fairly  good dentition. Neck: supple, no adenopathy,JVD or thyromegaly.No bruits.  Chest: Clear to ascultation bilaterally.No crackles or wheezes. Non tender to palpation  Breast: No asymetry,no masses or lumps. No tenderness. No nipple discharge or inversion. No axillary or supraclavicular adenopathy  Cardiovascular system; Heart sounds normal,  S1 and  S2 ,no S3.  No murmur, or thrill. Apical beat not displaced Peripheral pulses normal.  Abdomen: Soft, non tender, no organomegaly or masses. No bruits. Bowel sounds normal. No guarding, tenderness or rebound.  Rectal:  Normal sphincter tone. No mass.No rectal masses.  Guaiac negative stool.  GU: External genitalia normal female genitalia , female distribution of hair. No lesions. Urethral meatus normal in size, no  Prolapse, no lesions visibly  Present. Bladder non tender. Vagina pink and moist , with no visible lesions , discharge present . Adequate pelvic support no  cystocele or rectocele noted Cervix pink and appears healthy, no lesions or ulcerations noted, no discharge noted from os Uterus normal size, no adnexal masses, no cervical motion or adnexal tenderness.   Musculoskeletal exam: Decreased though adequate ROM of spine, hips , shoulders and knees.  deformity  ,swelling aand  crepitus noted.in right knee No muscle wasting or atrophy.  No significant swllinmg on right inner thigh, no palpable mass in area of concern  Neurologic: Cranial nerves 2 to 12 intact. Power, tone ,sensation and reflexes normal throughout. No disturbance in gait. No tremor.  Skin: Intact, no ulceration,  Noted.Hyperpigmentation of lower 1/3 of both legs, this is chronic no skin breakdown   Psych; Normal mood and affect. Judgement and concentration normal        Assessment & Plan:   Need for vaccination with 13-polyvalent pneumococcal conjugate vaccine .Vaccine administered at visit.    Encounter for annual physical exam Annual physical  exam as documented. Counseling done  re healthy lifestyle involving commitment to 150 minutes exercise per week, heart healthy diet, and attaining healthy weight/;. Pt applauded on weight loss success since past  4 months.  .The importance of adequate sleep also discussed. .Changes in health habits are decided on by the patient with goals and time frames  set for achieving them. Immunization and cancer screening needs are specifically addressed at this visit.

## 2013-11-02 NOTE — Assessment & Plan Note (Signed)
Annual physical  exam as documented. Counseling done  re healthy lifestyle involving commitment to 150 minutes exercise per week, heart healthy diet, and attaining healthy weight/;. Pt applauded on weight loss success since past  4 months.  .The importance of adequate sleep also discussed. .Changes in health habits are decided on by the patient with goals and time frames  set for achieving them. Immunization and cancer screening needs are specifically addressed at this visit.

## 2013-12-18 ENCOUNTER — Other Ambulatory Visit: Payer: Self-pay | Admitting: Family Medicine

## 2013-12-18 DIAGNOSIS — Z1231 Encounter for screening mammogram for malignant neoplasm of breast: Secondary | ICD-10-CM

## 2013-12-21 ENCOUNTER — Ambulatory Visit (HOSPITAL_COMMUNITY)
Admission: RE | Admit: 2013-12-21 | Discharge: 2013-12-21 | Disposition: A | Discharge status: Home / Self Care | Payer: Medicare FFS | Source: Ambulatory Visit | Attending: Family Medicine | Admitting: Family Medicine

## 2013-12-21 DIAGNOSIS — Z1231 Encounter for screening mammogram for malignant neoplasm of breast: Secondary | ICD-10-CM | POA: Diagnosis present

## 2014-01-28 ENCOUNTER — Telehealth: Payer: Self-pay | Admitting: Family Medicine

## 2014-01-28 DIAGNOSIS — L84 Corns and callosities: Secondary | ICD-10-CM

## 2014-01-30 NOTE — Telephone Encounter (Signed)
Called patient and left message for them to return call at the office   

## 2014-01-30 NOTE — Addendum Note (Signed)
Addended by: Eual Fines on: 01/30/2014 12:48 PM   Modules accepted: Orders

## 2014-01-30 NOTE — Telephone Encounter (Signed)
Right foot callus x 1 month- painful. Ok to enter requested referral?

## 2014-01-30 NOTE — Telephone Encounter (Signed)
Yes pls do , I will sign 

## 2014-01-30 NOTE — Telephone Encounter (Signed)
Referral entered  

## 2014-02-12 ENCOUNTER — Ambulatory Visit (INDEPENDENT_AMBULATORY_CARE_PROVIDER_SITE_OTHER): Payer: Commercial Managed Care - HMO | Admitting: Podiatry

## 2014-02-12 ENCOUNTER — Encounter: Payer: Self-pay | Admitting: Podiatry

## 2014-02-12 VITALS — BP 107/63 | HR 84 | Resp 16

## 2014-02-12 DIAGNOSIS — B079 Viral wart, unspecified: Secondary | ICD-10-CM | POA: Diagnosis not present

## 2014-02-12 DIAGNOSIS — B078 Other viral warts: Secondary | ICD-10-CM

## 2014-02-12 MED ORDER — FLUOROURACIL 5 % EX CREA: TOPICAL_CREAM | Freq: Two times a day (BID) | CUTANEOUS | Status: DC

## 2014-02-12 NOTE — Progress Notes (Signed)
   Subjective:    Patient ID: Michele Lowe, female    DOB: May 31, 1950, 63 y.o.   MRN: [id number]  HPI Comments: "I have like a callus on my foot"  Patient c/o tender medial right foot for about 1 month. There is a callused area. She has been using a pumice and neosporin. Softer than it was, but appears larger.     Review of Systems  Cardiovascular: Positive for palpitations and leg swelling.  Musculoskeletal: Positive for arthralgias.  Hematological: Bruises/bleeds easily.  All other systems reviewed and are negative.      Objective:   Physical Exam: I have reviewed her past medical history medications allergies surgery social history and review of systems. Pulses are strongly palpable bilateral. Neurologic sensorium is intact per Semmes-Weinstein monofilament. Deep tendon reflexes are intact bilateral and muscle strength is 5 over 5 dorsiflexors and plantar flexors and inverters and evertors onto the musculature is intact. Orthopedic evaluation demonstrates all joints distal to the ankle for range of motion without crepitation. Severe unilateral flatfoot deformity right area possibly associated with midfoot collapse due to posterior tibial tendon dysfunction. Cutaneous evaluation demonstrates supple well-hydrated cutis solitary lesion plantar medial arch beneath the navicular tuberosity which was described as a callus initially became painfully hard and was removed and now has left an area of tenderness. She states that it does not bleed and it does not itch. The lesion measures approximately 1 cm in diameter is flesh-colored is not raised does demonstrate what appears to be a few thrombosed capillaries and the lesion is circumferentially surrounded by skin lines much like a wart.        Assessment & Plan:  Assessment: Painful lesion plantar medial aspect of the right foot possibly associated with flatfoot deformity and chronic irritation of the navicular tuberosity. However we cannot  rule out a verruca plantaris are even a carcinoma at this point. I am hesitant to excise the lesion secondary to severe venous stasis of the bilateral lower extremities. I'm concerned about healing.  Plan: At this point instead of taking a biopsy of this we are going to apply padding every day and apply Efudex cream to the lesion if this resolves were good if not then we need to biopsy on her next visit.

## 2014-03-08 LAB — BASIC METABOLIC PANEL
BUN: 12 mg/dL (ref 6–23)
CHLORIDE: 99 meq/L (ref 96–112)
CO2: 34 mEq/L — ABNORMAL HIGH (ref 19–32)
Calcium: 9.5 mg/dL (ref 8.4–10.5)
Creat: 0.78 mg/dL (ref 0.50–1.10)
Glucose, Bld: 99 mg/dL (ref 70–99)
POTASSIUM: 4.4 meq/L (ref 3.5–5.3)
Sodium: 139 mEq/L (ref 135–145)

## 2014-03-08 LAB — HEMOGLOBIN A1C
Hgb A1c MFr Bld: 5.7 % — ABNORMAL HIGH (ref <5.7)
Mean Plasma Glucose: 117 mg/dL — ABNORMAL HIGH (ref <117)

## 2014-03-12 ENCOUNTER — Ambulatory Visit: Payer: PRIVATE HEALTH INSURANCE | Admitting: Family Medicine

## 2014-03-26 ENCOUNTER — Ambulatory Visit: Payer: Commercial Managed Care - HMO | Admitting: Podiatry

## 2014-03-27 ENCOUNTER — Encounter: Payer: Self-pay | Admitting: Family Medicine

## 2014-03-27 ENCOUNTER — Ambulatory Visit (INDEPENDENT_AMBULATORY_CARE_PROVIDER_SITE_OTHER): Payer: Medicare FFS | Admitting: Family Medicine

## 2014-03-27 VITALS — BP 130/76 | HR 84 | Resp 18 | Ht 67.0 in | Wt 349.0 lb

## 2014-03-27 DIAGNOSIS — R7303 Prediabetes: Secondary | ICD-10-CM

## 2014-03-27 DIAGNOSIS — D229 Melanocytic nevi, unspecified: Secondary | ICD-10-CM | POA: Diagnosis not present

## 2014-03-27 DIAGNOSIS — I10 Essential (primary) hypertension: Secondary | ICD-10-CM | POA: Diagnosis not present

## 2014-03-27 DIAGNOSIS — J309 Allergic rhinitis, unspecified: Secondary | ICD-10-CM

## 2014-03-27 DIAGNOSIS — Z79899 Other long term (current) drug therapy: Secondary | ICD-10-CM

## 2014-03-27 DIAGNOSIS — R7309 Other abnormal glucose: Secondary | ICD-10-CM | POA: Diagnosis not present

## 2014-03-27 DIAGNOSIS — K219 Gastro-esophageal reflux disease without esophagitis: Secondary | ICD-10-CM

## 2014-03-27 NOTE — Patient Instructions (Addendum)
Medicare welness  in 4 month, call if you need me before   Congrats ,on improved blood sugar, start counting calories, in other words reduce portion sizes to help  To promote weight loss  You are referred to dermatologist re mole on left forearm  Hope that the wart on the rigth foot continues to improve  We will provide 1500 cal diet sheet  Enjoy water with lemon! Fasting lipid, cmp and HBa1c, in 4 month

## 2014-03-27 NOTE — Progress Notes (Signed)
Subjective:    Patient ID: Michele Lowe, female    DOB: 30-May-1950, 64 y.o.   MRN: [id number]  HPI The PT is here for follow up and re-evaluation of chronic medical conditions, medication management and review of any available recent lab and radiology data.  Preventive health is updated, specifically  Cancer screening and Immunization.   Questions or concerns regarding consultations or procedures which the PT has had in the interim are  Addressed.Has been treatred with topical prep for left foot callus, healing is slow and haslimited her ability to exercise. Despite weight gain , she has improved he hBa1C which slearly shows more diligence with food choice, which is great The PT denies any adverse reactions to current medications since the last visit.  C/o mole on left forearm which has increased in size and become rough with irregularlity  In past several monts/ seemingly "overnight" will refer to derm     Review of Systems See HPI Denies recent fever or chills. Denies sinus pressure, nasal congestion, ear pain or sore throat. Denies chest congestion, productive cough or wheezing. Denies chest pains, palpitations and leg swelling Denies abdominal pain, nausea, vomiting,diarrhea or constipation.   Denies dysuria, frequency, hesitancy or incontinence. Denies joint pain, swelling and limitation in mobility. Denies headaches, seizures, numbness, or tingling. Denies depression, anxiety or insomnia. Denies skin break down        Objective:   Physical Exam BP 130/76 mmHg  Pulse 84  Resp 18  Ht 5\' 7"  (1.702 m)  Wt 349 lb (158.305 kg)  BMI 54.65 kg/m2  SpO2 96% Patient alert and oriented and in no cardiopulmonary distress.  HEENT: No facial asymmetry, EOMI,   oropharynx pink and moist.  Neck supple no JVD, no mass.  Chest: Clear to auscultation bilaterally.  CVS: S1, S2 no murmurs, no S3.Regular rate.  ABD: Soft non tender.   Ext: No edema  MS: Adequate though  reduced  ROM spine, shoulders, hips and knees.  Skin: Intact, hyperpigmented nevus on left forearm, max diameter approx 1.5 cm  Psych: Good eye contact, normal affect. Memory intact not anxious or depressed appearing.  CNS: CN 2-12 intact, power,  normal throughout.no focal deficits noted.        Assessment & Plan:  Nevus 1 m month h/o hyperpigmented mole on rigth forearm, increasing in size and irregular, will refer to derm for eval   Essential hypertension Controlled, no change in medication DASH diet and commitment to daily physical activity for a minimum of 30 minutes discussed and encouraged, as a part of hypertension management. The importance of attaining a healthy weight is also discussed.    Prediabetes Improved Patient educated about the importance of limiting  Carbohydrate intake , the need to commit to daily physical activity for a minimum of 30 minutes , and to commit weight loss. The fact that changes in all these areas will reduce or eliminate all together the development of diabetes is stressed.      MORBID OBESITY Deteriorated. Patient re-educated about  the importance of commitment to a  minimum of 150 minutes of exercise per week.Has been limited in past several months due to callus on foot which is being treated topically The importance of healthy food choices with portion control discussed. Encouraged to start a food diary, count calories and to consider  joining a support group. Sample diet sheets offered. Goals set by the patient for the next several months.      GERD (gastroesophageal reflux  disease) Controlled, no change in medication    Allergic rhinitis Controlled, no change in medication

## 2014-03-27 NOTE — Assessment & Plan Note (Signed)
1 m month h/o hyperpigmented mole on rigth forearm, increasing in size and irregular, will refer to derm for eval

## 2014-03-31 NOTE — Assessment & Plan Note (Signed)
Improved Patient educated about the importance of limiting  Carbohydrate intake , the need to commit to daily physical activity for a minimum of 30 minutes , and to commit weight loss. The fact that changes in all these areas will reduce or eliminate all together the development of diabetes is stressed.

## 2014-03-31 NOTE — Assessment & Plan Note (Signed)
Controlled, no change in medication  

## 2014-03-31 NOTE — Assessment & Plan Note (Signed)
Deteriorated. Patient re-educated about  the importance of commitment to a  minimum of 150 minutes of exercise per week.Has been limited in past several months due to callus on foot which is being treated topically The importance of healthy food choices with portion control discussed. Encouraged to start a food diary, count calories and to consider  joining a support group. Sample diet sheets offered. Goals set by the patient for the next several months.

## 2014-03-31 NOTE — Assessment & Plan Note (Signed)
Controlled, no change in medication DASH diet and commitment to daily physical activity for a minimum of 30 minutes discussed and encouraged, as a part of hypertension management. The importance of attaining a healthy weight is also discussed.  

## 2014-04-02 ENCOUNTER — Ambulatory Visit: Payer: Commercial Managed Care - HMO | Admitting: Podiatry

## 2014-04-23 ENCOUNTER — Ambulatory Visit (INDEPENDENT_AMBULATORY_CARE_PROVIDER_SITE_OTHER): Payer: Medicare FFS | Admitting: Podiatry

## 2014-04-23 ENCOUNTER — Encounter: Payer: Self-pay | Admitting: Podiatry

## 2014-04-23 VITALS — BP 114/74 | HR 93

## 2014-04-23 DIAGNOSIS — B078 Other viral warts: Secondary | ICD-10-CM

## 2014-04-23 DIAGNOSIS — B079 Viral wart, unspecified: Secondary | ICD-10-CM | POA: Diagnosis not present

## 2014-04-23 MED ORDER — MUPIROCIN 2 % EX OINT: TOPICAL_OINTMENT | CUTANEOUS | Status: DC

## 2014-04-23 NOTE — Progress Notes (Signed)
She presents today for follow-up of a painful lesion to the medial longitudinal arch of the right foot. This lesion appeared to be verrucoid last time she came in and she was utilizing Efudex cream she states it has gotten better and the pain has subsided considerably.  Objective: Vital signs are stable she is alert and oriented 3. Medial longitudinal arch right demonstrates a 1 cm x 1 cm superficial ulceration that appears to be non-verrucoid in nature. There is no pus but there is some serosanguineous drainage which is different than last visit. There is no surrounding erythema cellulitis or odor.  Assessment: Very odd verrucoid lesion that appears to be changing. At this point we will try to dry this superficial ulceration with a shoe gear change and with Epsom salts and warm water. I wrote a prescription for Bactroban ointment which she will apply daily after soaking. She does not resolve a biopsy will be necessary.

## 2014-04-30 ENCOUNTER — Other Ambulatory Visit: Payer: Self-pay | Admitting: Dermatology

## 2014-05-28 ENCOUNTER — Encounter: Payer: Self-pay | Admitting: Podiatry

## 2014-05-28 ENCOUNTER — Ambulatory Visit (INDEPENDENT_AMBULATORY_CARE_PROVIDER_SITE_OTHER): Payer: Medicare FFS | Admitting: Podiatry

## 2014-05-28 VITALS — BP 131/79 | HR 101 | Resp 16

## 2014-05-28 DIAGNOSIS — B079 Viral wart, unspecified: Secondary | ICD-10-CM | POA: Diagnosis not present

## 2014-05-28 DIAGNOSIS — B078 Other viral warts: Secondary | ICD-10-CM

## 2014-05-28 NOTE — Progress Notes (Signed)
She presents today for follow-up of verrucoid lesion to the plantar medial aspect of the right foot. She states that it seems to be doing much better and has gotten a lot smaller but continues to be somewhat tender and drains. She continues to use the Efudex cream.  Objective: Vital signs are stable she is alert and oriented 3. Small superficial ulcerative lesion demonstrates no signs of wart at this point in time the area appears to be clean and free of infection.  Assessment: Verrucoid lesion plantar medial aspect of the right foot that has developed into a superficial ulceration.  Plan: Discontinue the use of the Efudex cream and start with a small amount of Vaseline after soaking daily. Continue all other conservative therapies. And I will follow-up with her in 1 month

## 2014-06-20 DIAGNOSIS — C801 Malignant (primary) neoplasm, unspecified: Secondary | ICD-10-CM

## 2014-06-20 HISTORY — PX: MOLE REMOVAL: SHX2046

## 2014-06-20 HISTORY — DX: Malignant (primary) neoplasm, unspecified: C80.1

## 2014-06-25 ENCOUNTER — Ambulatory Visit: Payer: Medicare FFS | Admitting: Podiatry

## 2014-07-02 ENCOUNTER — Encounter: Payer: Self-pay | Admitting: Podiatry

## 2014-07-02 ENCOUNTER — Other Ambulatory Visit: Payer: Self-pay | Admitting: Family Medicine

## 2014-07-02 ENCOUNTER — Ambulatory Visit (INDEPENDENT_AMBULATORY_CARE_PROVIDER_SITE_OTHER): Payer: Medicare FFS | Admitting: Podiatry

## 2014-07-02 VITALS — BP 147/70 | HR 83 | Resp 16

## 2014-07-02 DIAGNOSIS — B078 Other viral warts: Secondary | ICD-10-CM

## 2014-07-02 DIAGNOSIS — B079 Viral wart, unspecified: Secondary | ICD-10-CM | POA: Diagnosis not present

## 2014-07-02 NOTE — Progress Notes (Signed)
She presents today for follow-up of a verruca plantaris to the plantar medial aspect of the right foot. Previously she has tolerated chemical destruction very easily and she states that the wound appears to be healing quite nicely.  Objective: White signs are stable she is alert and oriented 3. The wound has decreased in size from approximately 1 cm in the diameter to just 2-3 mm in diameter. It is very superficial and epithelialization is occurring.  Assessment: Well-healing verruca plantaris right foot.  Plan: Continue current therapies to allow this to heal and will follow up with her on an as-needed basis.

## 2014-08-09 LAB — COMPREHENSIVE METABOLIC PANEL
ALT: 29 U/L (ref 0–35)
AST: 25 U/L (ref 0–37)
Albumin: 3.7 g/dL (ref 3.5–5.2)
Alkaline Phosphatase: 111 U/L (ref 39–117)
BUN: 17 mg/dL (ref 6–23)
CHLORIDE: 101 meq/L (ref 96–112)
CO2: 33 meq/L — AB (ref 19–32)
Calcium: 9.1 mg/dL (ref 8.4–10.5)
Creat: 0.9 mg/dL (ref 0.50–1.10)
GLUCOSE: 88 mg/dL (ref 70–99)
Potassium: 3.9 mEq/L (ref 3.5–5.3)
SODIUM: 143 meq/L (ref 135–145)
Total Bilirubin: 0.5 mg/dL (ref 0.2–1.2)
Total Protein: 6.9 g/dL (ref 6.0–8.3)

## 2014-08-09 LAB — LIPID PANEL
Cholesterol: 154 mg/dL (ref 0–200)
HDL: 34 mg/dL — ABNORMAL LOW (ref >=46)
LDL Cholesterol: 98 mg/dL (ref 0–99)
TRIGLYCERIDES: 111 mg/dL (ref <150)
Total CHOL/HDL Ratio: 4.5 Ratio
VLDL: 22 mg/dL (ref 0–40)

## 2014-08-09 LAB — HEMOGLOBIN A1C
HEMOGLOBIN A1C: 5.8 % — AB (ref <5.7)
Mean Plasma Glucose: 120 mg/dL — ABNORMAL HIGH (ref <117)

## 2014-08-12 ENCOUNTER — Encounter: Payer: Self-pay | Admitting: Family Medicine

## 2014-08-12 ENCOUNTER — Ambulatory Visit (INDEPENDENT_AMBULATORY_CARE_PROVIDER_SITE_OTHER): Payer: Medicare FFS | Admitting: Family Medicine

## 2014-08-12 VITALS — BP 118/82 | HR 90 | Resp 16 | Ht 67.0 in | Wt 337.0 lb

## 2014-08-12 DIAGNOSIS — E8881 Metabolic syndrome: Secondary | ICD-10-CM

## 2014-08-12 DIAGNOSIS — Z13220 Encounter for screening for lipoid disorders: Secondary | ICD-10-CM

## 2014-08-12 DIAGNOSIS — R7303 Prediabetes: Secondary | ICD-10-CM

## 2014-08-12 DIAGNOSIS — I10 Essential (primary) hypertension: Secondary | ICD-10-CM

## 2014-08-12 DIAGNOSIS — Z0000 Encounter for general adult medical examination without abnormal findings: Secondary | ICD-10-CM | POA: Diagnosis not present

## 2014-08-12 DIAGNOSIS — R7309 Other abnormal glucose: Secondary | ICD-10-CM

## 2014-08-12 DIAGNOSIS — Z113 Encounter for screening for infections with a predominantly sexual mode of transmission: Secondary | ICD-10-CM

## 2014-08-12 DIAGNOSIS — H547 Unspecified visual loss: Secondary | ICD-10-CM

## 2014-08-12 MED ORDER — TRIAMTERENE-HCTZ 37.5-25 MG PO TABS: ORAL_TABLET | ORAL | Status: DC

## 2014-08-12 MED ORDER — FUROSEMIDE 40 MG PO TABS: 40.0000 mg | ORAL_TABLET | Freq: Every day | ORAL | Status: DC

## 2014-08-12 MED ORDER — POTASSIUM CHLORIDE CRYS ER 20 MEQ PO TBCR: 20.0000 meq | EXTENDED_RELEASE_TABLET | Freq: Every day | ORAL | Status: DC

## 2014-08-12 NOTE — Assessment & Plan Note (Addendum)

## 2014-08-12 NOTE — Patient Instructions (Signed)
F/u with rectal in 4 months, call if you need me before  Condolence and prayers re your recent loss   Referral in fo r eye exam  continue healthy lifestyle work  HBA1C, fasting lipid , chem 7 ,  TSH, HIV, CBC  In 4 month  Please work on good  health habits so that your health will improve. 1. Commitment to daily physical activity for 30 to 60  minutes, if you are able to do this.  2. Commitment to wise food choices. Aim for half of your  food intake to be vegetable and fruit, one quarter starchy foods, and one quarter protein. Try to eat on a regular schedule  3 meals per day, snacking between meals should be limited to vegetables or fruits or small portions of nuts. 64 ounces of water per day is generally recommended, unless you have specific health conditions, like heart failure or kidney failure where you will need to limit fluid intake.  3. Commitment to sufficient and a  good quality of physical and mental rest daily, generally between 6 to 8 hours per day.  WITH PERSISTANCE AND PERSEVERANCE, THE IMPOSSIBLE , BECOMES THE NORM!  Thanks for choosing [facility], we consider it a privelige to serve you.

## 2014-08-12 NOTE — Progress Notes (Signed)
Subjective:    Patient ID: Michele Lowe, female    DOB: April 30, 1950, 64 y.o.   MRN: [id number]  HPI Preventive Screening-Counseling & Management   Patient present here today for a Medicare annual wellness visit.   Current Problems (verified)   Medications Prior to Visit Allergies (verified)   PAST HISTORY  Family History (verified)   Social History  Divorced, 3 children, worked at a [employer], became disabled in 2007. Lives alone. Never been a smoker    Risk Factors  Current exercise habits: Some physical activity at home. Sometimes does silver sneakers classes and goes to the [facility]  Dietary issues discussed: Heart healthy, discussed reducing fried foods and red meat and limiting carbs. Increasing vegetable intake and eating more baked and broiled foods   Cardiac risk factors: Mother and Father had heart disease, brother recently passed from heart failure, and had established CAD at 17   Depression Screen  (Note: if answer to either of the following is "Yes", a more complete depression screening is indicated)   Over the past two weeks, have you felt down, depressed or hopeless? Yes, recent death in the family  Over the past two weeks, have you felt little interest or pleasure in doing things? yes  Have you lost interest or pleasure in daily life? No   Do you often feel hopeless? No  Do you cry easily over simple problems? No   Activities of Daily Living  In your present state of health, do you have any difficulty performing the following activities?  Driving?: No Managing money?: No Feeding yourself?:No Getting from bed to chair?:No Climbing a flight of stairs?: avoids stairs due to left knee pain  Preparing food and eating?:No Bathing or showering?:No Getting dressed?:No Getting to the toilet?:No Using the toilet?:No Moving around from place to place?: uses a cane sometimes if her knee is hurting bad   Fall Risk Assessment In the past year have you fallen  or had a near fall?:No Are you currently taking any medications that make you dizzy?:No   Hearing Difficulties: No Do you often ask people to speak up or repeat themselves?:No Do you experience ringing or noises in your ears?:No Do you have difficulty understanding soft or whispered voices?:No  Cognitive Testing  Alert? Yes Normal Appearance?Yes  Oriented to person? Yes Place? Yes  Time? Yes  Displays appropriate judgment?Yes  Can read the correct time from a watch face? yes Are you having problems remembering things?No  Advanced Directives have been discussed with the patient?Yes, no living will. Brochure given  , full code  List the Names of Other Physician/Practitioners you currently use:  Dr Milinda Pointer (podiatry)  Dr Imogene Burn (orthopedic)  Dr Marge Duncans (dermatologist)   Indicate any recent Medical Services you may have received from other than [facility] providers in the past year (date may be approximate).   Assessment:    Annual Wellness Exam   Plan:      Medicare Attestation  I have personally reviewed:  The patient's medical and social history  Their use of alcohol, tobacco or illicit drugs  Their current medications and supplements  The patient's functional ability including ADLs,fall risks, home safety risks, cognitive, and hearing and visual impairment  Diet and physical activities  Evidence for depression or mood disorders  The patient's weight, height, BMI, and visual acuity have been recorded in the chart. I have made referrals, counseling, and provided education to the patient based on review of the above and I have  provided the patient with a written personalized care plan for preventive services.     Review of Systems     Objective:   Physical Exam BP 118/82 mmHg  Pulse 90  Resp 16  Ht 5\' 7"  (1.702 m)  Wt 337 lb (152.862 kg)  BMI 52.77 kg/m2  SpO2 97%        Assessment & Plan:  Medicare annual wellness visit, subsequent Annual exam as  documented. Counseling done  re healthy lifestyle involving commitment to 150 minutes exercise per week, heart healthy diet, and attaining healthy weight.The importance of adequate sleep also discussed. Regular seat belt use and home safety, is also discussed. Changes in health habits are decided on by the patient with goals and time frames  set for achieving them. Immunization and cancer screening needs are specifically addressed at this visit.

## 2014-11-28 ENCOUNTER — Telehealth: Payer: Self-pay

## 2014-11-28 DIAGNOSIS — R7303 Prediabetes: Secondary | ICD-10-CM

## 2014-11-28 DIAGNOSIS — Z1159 Encounter for screening for other viral diseases: Secondary | ICD-10-CM

## 2014-11-28 DIAGNOSIS — I10 Essential (primary) hypertension: Secondary | ICD-10-CM

## 2014-11-28 DIAGNOSIS — E8881 Metabolic syndrome: Secondary | ICD-10-CM

## 2014-11-28 DIAGNOSIS — Z114 Encounter for screening for human immunodeficiency virus [HIV]: Secondary | ICD-10-CM

## 2014-11-28 NOTE — Telephone Encounter (Signed)
Lab ordered and faxed to [facility]

## 2014-11-29 ENCOUNTER — Other Ambulatory Visit: Payer: Self-pay | Admitting: Family Medicine

## 2014-11-29 DIAGNOSIS — Z1231 Encounter for screening mammogram for malignant neoplasm of breast: Secondary | ICD-10-CM

## 2014-11-29 LAB — HEMOGLOBIN A1C
Hgb A1c MFr Bld: 5.7 % — ABNORMAL HIGH (ref <5.7)
Mean Plasma Glucose: 117 mg/dL — ABNORMAL HIGH (ref <117)

## 2014-11-29 LAB — LIPID PANEL
Cholesterol: 143 mg/dL (ref 125–200)
HDL: 37 mg/dL — AB (ref >=46)
LDL CALC: 84 mg/dL (ref <130)
TRIGLYCERIDES: 111 mg/dL (ref <150)
Total CHOL/HDL Ratio: 3.9 Ratio (ref <=5.0)
VLDL: 22 mg/dL (ref <30)

## 2014-11-29 LAB — BASIC METABOLIC PANEL
BUN: 14 mg/dL (ref 7–25)
CO2: 32 mmol/L — AB (ref 20–31)
Calcium: 8.9 mg/dL (ref 8.6–10.4)
Chloride: 101 mmol/L (ref 98–110)
Creat: 0.78 mg/dL (ref 0.50–0.99)
Glucose, Bld: 87 mg/dL (ref 65–99)
POTASSIUM: 4 mmol/L (ref 3.5–5.3)
SODIUM: 140 mmol/L (ref 135–146)

## 2014-11-29 LAB — TSH: TSH: 1.545 u[IU]/mL (ref 0.350–4.500)

## 2014-11-30 LAB — HEPATITIS C ANTIBODY: HCV Ab: NEGATIVE

## 2014-11-30 LAB — HIV ANTIBODY (ROUTINE TESTING W REFLEX): HIV 1&2 Ab, 4th Generation: NONREACTIVE

## 2014-12-02 ENCOUNTER — Encounter: Payer: Self-pay | Admitting: Family Medicine

## 2014-12-02 ENCOUNTER — Ambulatory Visit (INDEPENDENT_AMBULATORY_CARE_PROVIDER_SITE_OTHER): Payer: Medicare FFS | Admitting: Family Medicine

## 2014-12-02 VITALS — BP 132/78 | HR 82 | Resp 18 | Ht 67.0 in | Wt 335.0 lb

## 2014-12-02 DIAGNOSIS — R7303 Prediabetes: Secondary | ICD-10-CM

## 2014-12-02 DIAGNOSIS — K219 Gastro-esophageal reflux disease without esophagitis: Secondary | ICD-10-CM | POA: Diagnosis not present

## 2014-12-02 DIAGNOSIS — I10 Essential (primary) hypertension: Secondary | ICD-10-CM | POA: Diagnosis not present

## 2014-12-02 MED ORDER — FUROSEMIDE 40 MG PO TABS: 40.0000 mg | ORAL_TABLET | Freq: Every day | ORAL | Status: DC

## 2014-12-02 NOTE — Progress Notes (Signed)
Subjective:    Patient ID: Michele Lowe, female    DOB: [birth year], 64 y.o.   MRN: [id number]  HPI    Michele Lowe     MRN: [id number]      DOB: [birth year]   HPI [name] is here for follow up and re-evaluation of chronic medical conditions, medication management and review of any available recent lab and radiology data.  Preventive health is updated, specifically  Cancer screening and Immunization.   Questions or concerns regarding consultations or procedures which the PT has had in the interim are  addressed. The PT denies any adverse reactions to current medications since the last visit.  There are no new concerns.  There are no specific complaints   ROS Denies recent fever or chills. Denies sinus pressure, nasal congestion, ear pain or sore throat. Denies chest congestion, productive cough or wheezing. Denies chest pains, palpitations and leg swelling Denies abdominal pain, nausea, vomiting,diarrhea or constipation.   Denies  Uncontrolled  joint pain, swelling and limitation in mobility. Denies headaches, seizures, numbness, or tingling.  PE  BP 132/78 mmHg  Pulse 82  Resp 18  Ht 5\' 7"  (1.702 m)  Wt 335 lb (151.955 kg)  BMI 52.46 kg/m2  SpO2 97%  Patient alert and oriented and in no cardiopulmonary distress.  HEENT: No facial asymmetry, EOMI,   oropharynx pink and moist.  Neck supple no JVD, no mass.  Chest: Clear to auscultation bilaterally.  CVS: S1, S2 no murmurs, no S3.Regular rate.  ABD: Soft non tender.   Ext: T^race edema  MS: Adequate ROM spine, shoulders, hips and knees.  Skin: Intact,  rash noted on both lower extremities,chronic, no sign of current infection or inflammation   Psych: Good eye contact, normal affect. Memory intact not anxious mildly tearful and depressed appearing.Grieving unexpected death of her brother, not suicidal or homicidal  CNS: CN 2-12 intact, power,  normal throughout.no focal deficits noted.   Assessment  & Plan   Essential hypertension Controlled, no change in medication DASH diet and commitment to daily physical activity for a minimum of 30 minutes discussed and encouraged, as a part of hypertension management. The importance of attaining a healthy weight is also discussed.  BP/Weight 12/02/2014 08/12/2014 07/02/2014 05/28/2014 04/23/2014 03/27/2014 25/95/6387  Systolic BP 564 332 951 884 166 063 016  Diastolic BP 78 82 70 79 74 76 63  Wt. (Lbs) 335 337 - - - 349 -  BMI 52.46 52.77 - - - 54.65 -        Prediabetes Patient educated about the importance of limiting  Carbohydrate intake , the need to commit to daily physical activity for a minimum of 30 minutes , and to commit weight loss. The fact that changes in all these areas will reduce or eliminate all together the development of diabetes is stressed.  Improved  Diabetic Labs Latest Ref Rng 11/28/2014 08/09/2014 03/07/2014 10/19/2013 06/18/2013  HbA1c <5.7 % 5.7(H) 5.8(H) 5.7(H) 6.0(H) 5.7(H)  Chol 125 - 200 mg/dL 143 154 - - -  HDL >=46 mg/dL 37(L) 34(L) - - -  Calc LDL <130 mg/dL 84 98 - - -  Triglycerides <150 mg/dL 111 111 - - -  Creatinine 0.50 - 0.99 mg/dL 0.78 0.90 0.78 0.67 -   BP/Weight 12/02/2014 08/12/2014 07/02/2014 05/28/2014 04/23/2014 03/27/2014 02/23/3233  Systolic BP 573 220 254 270 623 762 831  Diastolic BP 78 82 70 79 74 76 63  Wt. (Lbs) 335 337 - - - 349 -  BMI 52.46 52.77 - - - 54.65 -   No flowsheet data found.     GERD (gastroesophageal reflux disease) Controlled, no change in medication   MORBID OBESITY Slight improveemnt Patient re-educated about  the importance of commitment to a  minimum of 150 minutes of exercise per week.  The importance of healthy food choices with portion control discussed. Encouraged to start a food diary, count calories and to consider  joining a support group. Sample diet sheets offered. Goals set by the patient for the next several months.   Weight /BMI 12/02/2014 08/12/2014  03/27/2014  WEIGHT 335 lb 337 lb 349 lb  HEIGHT 5\' 7"  5\' 7"  5\' 7"   BMI 52.46 kg/m2 52.77 kg/m2 54.65 kg/m2    Current exercise per week 30 minutes.       Review of Systems     Objective:   Physical Exam        Assessment & Plan:

## 2014-12-02 NOTE — Patient Instructions (Addendum)
Annual physical exam in 4 month, call if you need me before  Please work on good  health habits so that your health will improve. 1. Commitment to daily physical activity for 30 to 60  minutes, if you are able to do this.  2. Commitment to wise food choices. Aim for half of your  food intake to be vegetable and fruit, one quarter starchy foods, and one quarter protein. Try to eat on a regular schedule  3 meals per day, snacking between meals should be limited to vegetables or fruits or small portions of nuts. 64 ounces of water per day is generally recommended, unless you have specific health conditions, like heart failure or kidney failure where you will need to limit fluid intake.  3. Commitment to sufficient and a  good quality of physical and mental rest daily, generally between 6 to 8 hours per day.  WITH PERSISTANCE AND PERSEVERANCE, THE IMPOSSIBLE , BECOMES THE NORM!   Thanks for choosing [facility], we consider it a privelige to serve you.  Come  in for vaccine when you decide

## 2014-12-03 NOTE — Assessment & Plan Note (Signed)
Patient educated about the importance of limiting  Carbohydrate intake , the need to commit to daily physical activity for a minimum of 30 minutes , and to commit weight loss. The fact that changes in all these areas will reduce or eliminate all together the development of diabetes is stressed.  Improved  Diabetic Labs Latest Ref Rng 11/28/2014 08/09/2014 03/07/2014 10/19/2013 06/18/2013  HbA1c <5.7 % 5.7(H) 5.8(H) 5.7(H) 6.0(H) 5.7(H)  Chol 125 - 200 mg/dL 143 154 - - -  HDL >=46 mg/dL 37(L) 34(L) - - -  Calc LDL <130 mg/dL 84 98 - - -  Triglycerides <150 mg/dL 111 111 - - -  Creatinine 0.50 - 0.99 mg/dL 0.78 0.90 0.78 0.67 -   BP/Weight 12/02/2014 08/12/2014 07/02/2014 05/28/2014 04/23/2014 03/27/2014 46/50/3546  Systolic BP 568 127 517 001 749 449 675  Diastolic BP 78 82 70 79 74 76 63  Wt. (Lbs) 335 337 - - - 349 -  BMI 52.46 52.77 - - - 54.65 -   No flowsheet data found.

## 2014-12-03 NOTE — Assessment & Plan Note (Addendum)
Slight improveemnt Patient re-educated about  the importance of commitment to a  minimum of 150 minutes of exercise per week.  The importance of healthy food choices with portion control discussed. Encouraged to start a food diary, count calories and to consider  joining a support group. Sample diet sheets offered. Goals set by the patient for the next several months.   Weight /BMI 12/02/2014 08/12/2014 03/27/2014  WEIGHT 335 lb 337 lb 349 lb  HEIGHT 5\' 7"  5\' 7"  5\' 7"   BMI 52.46 kg/m2 52.77 kg/m2 54.65 kg/m2    Current exercise per week 30 minutes.

## 2014-12-03 NOTE — Assessment & Plan Note (Signed)
Controlled, no change in medication DASH diet and commitment to daily physical activity for a minimum of 30 minutes discussed and encouraged, as a part of hypertension management. The importance of attaining a healthy weight is also discussed.  BP/Weight 12/02/2014 08/12/2014 07/02/2014 05/28/2014 04/23/2014 03/27/2014 01/01/3566  Systolic BP 014 103 013 143 888 757 972  Diastolic BP 78 82 70 79 74 76 63  Wt. (Lbs) 335 337 - - - 349 -  BMI 52.46 52.77 - - - 54.65 -

## 2014-12-03 NOTE — Assessment & Plan Note (Signed)
Controlled, no change in medication  

## 2014-12-23 ENCOUNTER — Ambulatory Visit (HOSPITAL_COMMUNITY)
Admission: RE | Admit: 2014-12-23 | Discharge: 2014-12-23 | Disposition: A | Discharge status: Home / Self Care | Payer: Medicare FFS | Source: Ambulatory Visit | Attending: Family Medicine | Admitting: Family Medicine

## 2014-12-23 DIAGNOSIS — Z1231 Encounter for screening mammogram for malignant neoplasm of breast: Secondary | ICD-10-CM | POA: Insufficient documentation

## 2015-04-07 ENCOUNTER — Encounter: Payer: Medicare FFS | Admitting: Family Medicine

## 2015-04-29 ENCOUNTER — Other Ambulatory Visit: Payer: Self-pay | Admitting: Family Medicine

## 2015-05-06 ENCOUNTER — Encounter: Payer: Self-pay | Admitting: Family Medicine

## 2015-05-06 ENCOUNTER — Encounter: Payer: Medicare FFS | Admitting: Family Medicine

## 2015-08-04 ENCOUNTER — Ambulatory Visit (INDEPENDENT_AMBULATORY_CARE_PROVIDER_SITE_OTHER): Payer: Medicare FFS | Admitting: Family Medicine

## 2015-08-04 ENCOUNTER — Encounter: Payer: Self-pay | Admitting: Family Medicine

## 2015-08-04 VITALS — BP 130/82 | HR 96 | Resp 18 | Ht 67.0 in | Wt 342.0 lb

## 2015-08-04 DIAGNOSIS — R7303 Prediabetes: Secondary | ICD-10-CM | POA: Diagnosis not present

## 2015-08-04 DIAGNOSIS — Z23 Encounter for immunization: Secondary | ICD-10-CM | POA: Diagnosis not present

## 2015-08-04 DIAGNOSIS — Z0000 Encounter for general adult medical examination without abnormal findings: Secondary | ICD-10-CM

## 2015-08-04 DIAGNOSIS — Z1211 Encounter for screening for malignant neoplasm of colon: Secondary | ICD-10-CM

## 2015-08-04 DIAGNOSIS — Z13820 Encounter for screening for osteoporosis: Secondary | ICD-10-CM

## 2015-08-04 DIAGNOSIS — I10 Essential (primary) hypertension: Secondary | ICD-10-CM

## 2015-08-04 HISTORY — DX: Encounter for general adult medical examination without abnormal findings: Z00.00

## 2015-08-04 LAB — HEMOCCULT GUIAC POC 1CARD (OFFICE): FECAL OCCULT BLD: NEGATIVE

## 2015-08-04 MED ORDER — POTASSIUM CHLORIDE CRYS ER 20 MEQ PO TBCR: 20.0000 meq | EXTENDED_RELEASE_TABLET | Freq: Every day | ORAL | Status: DC

## 2015-08-04 NOTE — Assessment & Plan Note (Signed)
After obtaining informed consent, the vaccine is  administered by LPN.  

## 2015-08-04 NOTE — Assessment & Plan Note (Signed)

## 2015-08-04 NOTE — Progress Notes (Signed)
Subjective:    Patient ID: Michele Lowe, female    DOB: 1950/05/25, 65 y.o.   MRN: [id number]  HPI Patient is in for annual physical exam. Immunization is reviewed , and  updated if needed.Needed pneumonia vaccine administered C/o breast asymetry, states left breast seems larger, denies lump or nipple discharge  Review of Systems See HPI     Objective:   Physical Exam BP 130/82 mmHg  Pulse 96  Resp 18  Ht 5\' 7"  (1.702 m)  Wt 342 lb 0.6 oz (155.148 kg)  BMI 53.56 kg/m2  SpO2 97% Pleasant morbidly obese female, alert and oriented x 3, in no cardio-pulmonary distress. Afebrile. HEENT No facial trauma or asymetry. Sinuses non tender.  Extra occullar muscles intact, pupils equally reactive to light. External ears normal, tympanic membranes clear. Oropharynx moist, no exudate, good dentition. Neck: supple, no adenopathy,JVD or thyromegaly.No bruits.  Chest: Clear to ascultation bilaterally.No crackles or wheezes. Non tender to palpation  Breast: No asymetry,no masses or lumps. No tenderness. No nipple discharge or inversion. No axillary or supraclavicular adenopathy  Cardiovascular system; Heart sounds normal,  S1 and  S2 ,no S3.  No murmur, or thrill. Apical beat not displaced Peripheral pulses normal.  Abdomen: Soft, non tender, no organomegaly or masses. No bruits. Bowel sounds normal. No guarding, tenderness or rebound.  Rectal:  Normal sphincter tone. No mass.No rectal masses.  Guaiac negative stool.  GU: External genitalia normal female genitalia , female distribution of hair. No lesions. Urethral meatus normal in size, no  Prolapse, no lesions visibly  Present. Bladder non tender. Vagina pink and moist , with no visible lesions , discharge present . Adequate pelvic support no  cystocele or rectocele noted Uterus absent, no adnexal masses, no adnexal tenderness  Musculoskeletal exam: Adequate though reduced ROM of spine, hips ,  and  knees. deformity ,swelling and  crepitus note in right knee, left knee replaced , scar intact. No muscle wasting or atrophy.  Bilaterally severe foot deformity  Neurologic: Cranial nerves 2 to 12 intact. Power, tone ,sensation and reflexes normal throughout. No disturbance in gait. No tremor.  Skin: Intact, hyperpigmented rash on both lower extremities, anterior aspect, no skin breakdown, small scar on left anterior leg , healed and hypopigmented, diameter approx 2.5 cm, callluses on both feet first/ great toe Psych; Normal mood and affect. Judgement and concentration normal       Assessment & Plan:  Annual physical exam Annual exam as documented. Counseling done  re healthy lifestyle involving commitment to 150 minutes exercise per week, heart healthy diet, and attaining healthy weight.The importance of adequate sleep also discussed. Regular seat belt use and home safety, is also discussed. Changes in health habits are decided on by the patient with goals and time frames  set for achieving them. Immunization and cancer screening needs are specifically addressed at this visit.   Need for 23-polyvalent pneumococcal polysaccharide vaccine After obtaining informed consent, the vaccine is  administered by LPN.   MORBID OBESITY Deteriorated. Patient re-educated about  the importance of commitment to a  minimum of 150 minutes of exercise per week.  The importance of healthy food choices with portion control discussed. Encouraged to start a food diary, count calories and to consider  joining a support group. Sample diet sheets offered. Goals set by the patient for the next several months.   Weight /BMI 08/04/2015 12/02/2014 08/12/2014  WEIGHT 342 lb 0.6 oz 335 lb 337 lb  HEIGHT 5\' 7"  5\' 7"   5\' 7"   BMI 53.56 kg/m2 52.46 kg/m2 52.77 kg/m2    Current exercise per week 60 minutes.

## 2015-08-04 NOTE — Assessment & Plan Note (Signed)
Deteriorated. Patient re-educated about  the importance of commitment to a  minimum of 150 minutes of exercise per week.  The importance of healthy food choices with portion control discussed. Encouraged to start a food diary, count calories and to consider  joining a support group. Sample diet sheets offered. Goals set by the patient for the next several months.   Weight /BMI 08/04/2015 12/02/2014 08/12/2014  WEIGHT 342 lb 0.6 oz 335 lb 337 lb  HEIGHT 5\' 7"  5\' 7"  5\' 7"   BMI 53.56 kg/m2 52.46 kg/m2 52.77 kg/m2    Current exercise per week 60 minutes.

## 2015-08-04 NOTE — Patient Instructions (Signed)
Annual wellness in 4 month, call if you need me sooner  Pneumonia 23 today  Labs today  You are referred for bone density test  PLEASE resume swimming / water exercises and commit to healthy food choices  Thank you  for choosing [facility]. We consider it a privelige to serve you.  Delivering excellent health care in a caring and  compassionate way is our goal.  Partnering with you,  so that together we can achieve this goal is our strategy.

## 2015-08-11 ENCOUNTER — Other Ambulatory Visit: Payer: Self-pay | Admitting: Family Medicine

## 2015-08-11 ENCOUNTER — Ambulatory Visit (HOSPITAL_COMMUNITY)
Admission: RE | Admit: 2015-08-11 | Discharge: 2015-08-11 | Disposition: A | Discharge status: Home / Self Care | Payer: Medicare FFS | Source: Ambulatory Visit | Attending: Family Medicine | Admitting: Family Medicine

## 2015-08-11 DIAGNOSIS — Z13820 Encounter for screening for osteoporosis: Secondary | ICD-10-CM | POA: Insufficient documentation

## 2015-08-22 LAB — BASIC METABOLIC PANEL
BUN: 10 mg/dL (ref 7–25)
CALCIUM: 9 mg/dL (ref 8.6–10.4)
CO2: 29 mmol/L (ref 20–31)
CREATININE: 0.74 mg/dL (ref 0.50–0.99)
Chloride: 102 mmol/L (ref 98–110)
GLUCOSE: 121 mg/dL — AB (ref 65–99)
Potassium: 3.8 mmol/L (ref 3.5–5.3)
SODIUM: 141 mmol/L (ref 135–146)

## 2015-08-22 LAB — CBC
HCT: 40.8 % (ref 35.0–45.0)
HEMOGLOBIN: 13.2 g/dL (ref 11.7–15.5)
MCH: 28.2 pg (ref 27.0–33.0)
MCHC: 32.4 g/dL (ref 32.0–36.0)
MCV: 87.2 fL (ref 80.0–100.0)
MPV: 10.5 fL (ref 7.5–12.5)
Platelets: 254 10*3/uL (ref 140–400)
RBC: 4.68 MIL/uL (ref 3.80–5.10)
RDW: 14.1 % (ref 11.0–15.0)
WBC: 5.8 10*3/uL (ref 3.8–10.8)

## 2015-08-22 LAB — VITAMIN D 25 HYDROXY (VIT D DEFICIENCY, FRACTURES): VIT D 25 HYDROXY: 41 ng/mL (ref 30–100)

## 2015-08-22 LAB — TSH: TSH: 1.06 mIU/L

## 2015-08-22 LAB — HEMOGLOBIN A1C
HEMOGLOBIN A1C: 5.4 % (ref <5.7)
MEAN PLASMA GLUCOSE: 108 mg/dL

## 2015-10-03 ENCOUNTER — Telehealth: Payer: Self-pay | Admitting: Family Medicine

## 2015-10-03 MED ORDER — TRIAMTERENE-HCTZ 37.5-25 MG PO TABS: ORAL_TABLET | ORAL | 0 refills | Status: DC

## 2015-10-03 NOTE — Telephone Encounter (Signed)
Patient is asking for a refill triamterene-hydrochlorothiazide (MAXZIDE-25) 37.5-25 MG [facility]

## 2015-10-03 NOTE — Telephone Encounter (Signed)
Med refilled.

## 2015-12-10 ENCOUNTER — Other Ambulatory Visit: Payer: Self-pay | Admitting: Family Medicine

## 2015-12-18 ENCOUNTER — Other Ambulatory Visit: Payer: Self-pay | Admitting: Family Medicine

## 2015-12-18 DIAGNOSIS — Z1231 Encounter for screening mammogram for malignant neoplasm of breast: Secondary | ICD-10-CM

## 2015-12-29 ENCOUNTER — Ambulatory Visit (HOSPITAL_COMMUNITY)
Admission: RE | Admit: 2015-12-29 | Discharge: 2015-12-29 | Disposition: A | Discharge status: Home / Self Care | Payer: Medicare FFS | Source: Ambulatory Visit | Attending: Family Medicine | Admitting: Family Medicine

## 2015-12-29 DIAGNOSIS — Z1231 Encounter for screening mammogram for malignant neoplasm of breast: Secondary | ICD-10-CM | POA: Diagnosis present

## 2016-01-07 ENCOUNTER — Ambulatory Visit (INDEPENDENT_AMBULATORY_CARE_PROVIDER_SITE_OTHER): Payer: Medicare FFS

## 2016-01-07 VITALS — BP 118/78 | HR 80 | Resp 18 | Ht 67.0 in | Wt 344.1 lb

## 2016-01-07 DIAGNOSIS — Z0000 Encounter for general adult medical examination without abnormal findings: Secondary | ICD-10-CM

## 2016-01-07 DIAGNOSIS — J302 Other seasonal allergic rhinitis: Secondary | ICD-10-CM | POA: Diagnosis not present

## 2016-01-07 MED ORDER — FLUTICASONE PROPIONATE 50 MCG/ACT NA SUSP: 2.0000 | Freq: Every day | NASAL | 1 refills | Status: DC

## 2016-01-07 NOTE — Progress Notes (Signed)
Subjective:    Michele Lowe is a 65 y.o. female who presents for Medicare Annual/Subsequent preventive examination.  Preventive Screening-Counseling & Management  Tobacco History  Smoking Status  . Never Smoker  Smokeless Tobacco  . Never Used     Current Problems (verified) Patient Active Problem List   Diagnosis Date Noted  . Annual physical exam 08/04/2015  . Need for 23-polyvalent pneumococcal polysaccharide vaccine 08/04/2015  . GERD (gastroesophageal reflux disease) 04/12/2013  . Prediabetes 10/19/2011  . Metabolic syndrome X [id number]  . KNEE PAIN, RIGHT 09/13/2008  . Allergic rhinitis 08/18/2008  . MORBID OBESITY 09/13/2007  . Essential hypertension 09/13/2007    Medications Prior to Visit Current Outpatient Prescriptions on File Prior to Visit  Medication Sig Dispense Refill  . aspirin (ASPIRIN LOW DOSE) 81 MG EC tablet Take 81 mg by mouth daily.      . calcium-vitamin D (OSCAL WITH D) 500-200 MG-UNIT per tablet Take 1 tablet by mouth 3 (three) times daily.      Marland Kitchen Cod Liver Oil CAPS Take by mouth daily.      . furosemide (LASIX) 40 MG tablet Take 1 tablet (40 mg total) by mouth daily. 90 tablet 1  . Glucosamine-Chondroit-Vit C-Mn (GLUCOSAMINE 1500 COMPLEX PO) Take 1 capsule by mouth daily.    . lansoprazole (PREVACID) 30 MG capsule Take 1 capsule (30 mg total) by mouth 2 (two) times daily before a meal. Twice daily at 10am and 5 pm 28 capsule 0  . loratadine (CLARITIN) 10 MG tablet Take 1 tablet (10 mg total) by mouth daily. 90 tablet 1  . Multiple Vitamin (MULTIVITAMIN PO) Take by mouth daily.      . potassium chloride SA (K-DUR,KLOR-CON) 20 MEQ tablet Take 1 tablet (20 mEq total) by mouth daily. 90 tablet 1  . triamterene-hydrochlorothiazide (MAXZIDE-25) 37.5-25 MG tablet TAKE 1 AND 1/2 TABLETS ONE TIME DAILY 135 tablet 3  . vitamin C (ASCORBIC ACID) 500 MG tablet Take 500 mg by mouth daily.     No current facility-administered medications on file prior to  visit.     Current Medications (verified) Current Outpatient Prescriptions  Medication Sig Dispense Refill  . aspirin (ASPIRIN LOW DOSE) 81 MG EC tablet Take 81 mg by mouth daily.      . calcium-vitamin D (OSCAL WITH D) 500-200 MG-UNIT per tablet Take 1 tablet by mouth 3 (three) times daily.      Marland Kitchen Cod Liver Oil CAPS Take by mouth daily.      . fluticasone (FLONASE) 50 MCG/ACT nasal spray Place 2 sprays into both nostrils daily. 48 g 1  . furosemide (LASIX) 40 MG tablet Take 1 tablet (40 mg total) by mouth daily. 90 tablet 1  . Glucosamine-Chondroit-Vit C-Mn (GLUCOSAMINE 1500 COMPLEX PO) Take 1 capsule by mouth daily.    . lansoprazole (PREVACID) 30 MG capsule Take 1 capsule (30 mg total) by mouth 2 (two) times daily before a meal. Twice daily at 10am and 5 pm 28 capsule 0  . loratadine (CLARITIN) 10 MG tablet Take 1 tablet (10 mg total) by mouth daily. 90 tablet 1  . Multiple Vitamin (MULTIVITAMIN PO) Take by mouth daily.      . potassium chloride SA (K-DUR,KLOR-CON) 20 MEQ tablet Take 1 tablet (20 mEq total) by mouth daily. 90 tablet 1  . triamterene-hydrochlorothiazide (MAXZIDE-25) 37.5-25 MG tablet TAKE 1 AND 1/2 TABLETS ONE TIME DAILY 135 tablet 3  . vitamin C (ASCORBIC ACID) 500 MG tablet Take 500 mg by mouth  daily.     No current facility-administered medications for this visit.      Allergies (verified) Influenza vaccines and Oxycodone hcl   PAST HISTORY  Family History Family History  Problem Relation Age of Onset  . Heart failure Mother   . Hypertension Mother   . Heart disease Mother     Irregular heart valve  . Hypertension Father   . Emphysema Father   . Heart disease Father   . COPD Father   . Hypertension Sister   . Diabetes Brother   . Heart failure Brother   . Diabetes Brother   . Diabetes Brother   . Hyperlipidemia Brother   . Hypertension Sister     Social History Social History  Substance Use Topics  . Smoking status: Never Smoker  . Smokeless  tobacco: Never Used  . Alcohol use No     Are there smokers in your home (other than you)? No  Risk Factors Current exercise habits: The patient does not participate in regular exercise at present.  Dietary issues discussed: Changes with food labels   Cardiac risk factors: advanced age (older than 19 for men, 65 for women), hypertension, obesity (BMI >= 30 kg/m2) and sedentary lifestyle.  Depression Screen (Note: if answer to either of the following is "Yes", a more complete depression screening is indicated)   Over the past two weeks, have you felt down, depressed or hopeless? No  Over the past two weeks, have you felt little interest or pleasure in doing things? No  Have you lost interest or pleasure in daily life? No  Do you often feel hopeless? No  Do you cry easily over simple problems? No  Activities of Daily Living In your present state of health, do you have any difficulty performing the following activities?:  Driving? No Managing money?  No Feeding yourself? No Getting from bed to chair? No Climbing a flight of stairs? Yes due to knee pain and use of cane  Preparing food and eating?: No Bathing or showering? No Getting dressed: No Getting to the toilet? No Using the toilet:No Moving around from place to place: Yes due to the use of assistive device In the past year have you fallen or had a near fall?:No   Are you sexually active?  No  Do you have more than one partner?  Na   Hearing Difficulties: No Do you often ask people to speak up or repeat themselves? No Do you experience ringing or noises in your ears? No Do you have difficulty understanding soft or whispered voices? No   Do you feel that you have a problem with memory? No  Do you often misplace items? No  Do you feel safe at home?  Yes  Cognitive Testing  Alert? Yes  Normal Appearance?Yes  Oriented to person? Yes  Place? Yes   Time? Yes  Recall of three objects?  Yes  Can perform simple  calculations? Yes  Displays appropriate judgment?Yes  Can read the correct time from a watch face?Yes   Advanced Directives have been discussed with the patient? Yes  List the Names of Other Physician/Practitioners you currently use: 1.  Dr. Iona Hansen- opthalmology  2.  Dr. Denna Haggard- dermatology 3.  Dr.  Dorothyann Peng -orthopedics  4.  Dr. Karleen Dolphin   Indicate any recent Medical Services you may have received from other than [facility] providers in the past year (date may be approximate).  Immunization History  Administered Date(s) Administered  . Influenza Whole 11/23/2006  .  Pneumococcal Conjugate-13 10/31/2013  . Pneumococcal Polysaccharide-23 08/04/2015  . Td 11/23/2006  . Zoster 02/13/2009    Screening Tests Health Maintenance  Topic Date Due  . INFLUENZA VACCINE  05/15/2016 (Originally 09/16/2015)  . COLONOSCOPY  02/23/2016  . TETANUS/TDAP  11/22/2016  . MAMMOGRAM  12/28/2017  . DEXA SCAN  Completed  . ZOSTAVAX  Completed  . Hepatitis C Screening  Completed  . PNA vac Low Risk Adult  Completed    All answers were reviewed with the patient and necessary referrals were made:  Vanetta Mulders, LPN   [id number]   History reviewed: allergies, current medications, past family history, past medical history, past social history, past surgical history and problem list  Review of Systems A comprehensive review of systems was negative.    Objective:     Vision by Snellen chart: right eye:20/50, left eye:20/70  Body mass index is 53.9 kg/m. BP 118/78   Pulse 80   Resp 18   Ht 5\' 7"  (1.702 m)   Wt (!) 344 lb 1.9 oz (156.1 kg)   SpO2 96%   BMI 53.90 kg/m   No exam performed today, annual wellness without physical exam.     Assessment:        Plan:     During the course of the visit the patient was educated and counseled about appropriate screening and preventive services including:    Bone densitometry screening  Nutrition counseling   Diet review  for nutrition referral? Yes ____  Not Indicated _x___   Patient Instructions (the written plan) was given to the patient.  Medicare Attestation I have personally reviewed: The patient's medical and social history Their use of alcohol, tobacco or illicit drugs Their current medications and supplements The patient's functional ability including ADLs,fall risks, home safety risks, cognitive, and hearing and visual impairment Diet and physical activities Evidence for depression or mood disorders  The patient's weight, height, BMI, and visual acuity have been recorded in the chart.  I have made referrals, counseling, and provided education to the patient based on review of the above and I have provided the patient with a written personalized care plan for preventive services.     Denman George [place], [place]   [id number]

## 2016-01-07 NOTE — Patient Instructions (Signed)
Thank you for choosing [facility] for your health care needs  The Annual Wellness Visit is designed to allow Michele Lowe the chance to assist you in preserving and improving you health.   Dr. Moshe Cipro will see you back in 4 months  If any labs are needed they will be mailed to you with the approximate date to have them done   I have refilled your Flonase nasal spray  Don't for get to go back to the regular Asprin 81 mg   If you have any questions or concerns feel free to contact the office.

## 2016-01-18 ENCOUNTER — Telehealth: Payer: Self-pay | Admitting: Family Medicine

## 2016-01-18 DIAGNOSIS — I10 Essential (primary) hypertension: Secondary | ICD-10-CM

## 2016-01-18 DIAGNOSIS — R7303 Prediabetes: Secondary | ICD-10-CM

## 2016-01-18 DIAGNOSIS — E559 Vitamin D deficiency, unspecified: Secondary | ICD-10-CM

## 2016-01-18 NOTE — Telephone Encounter (Signed)
Needs non fast chem 7 , hBA1C and vit D for 4 month f/u with me, done 1 week prior pls!

## 2016-01-26 NOTE — Telephone Encounter (Signed)
Labs ordered to have done prior to next appointment.  Order mailed to patient's address on file.

## 2016-01-26 NOTE — Addendum Note (Signed)
Addended by: Denman George B on: 01/26/2016 05:05 PM   Modules accepted: Orders

## 2016-02-02 ENCOUNTER — Other Ambulatory Visit: Payer: Self-pay

## 2016-02-02 MED ORDER — POTASSIUM CHLORIDE CRYS ER 20 MEQ PO TBCR: 20.0000 meq | EXTENDED_RELEASE_TABLET | Freq: Every day | ORAL | 1 refills | Status: DC

## 2016-02-17 ENCOUNTER — Other Ambulatory Visit: Payer: Self-pay

## 2016-02-17 MED ORDER — FUROSEMIDE 40 MG PO TABS: 40.0000 mg | ORAL_TABLET | Freq: Every day | ORAL | 0 refills | Status: DC

## 2016-05-04 ENCOUNTER — Ambulatory Visit (INDEPENDENT_AMBULATORY_CARE_PROVIDER_SITE_OTHER): Payer: Medicare FFS | Admitting: Family Medicine

## 2016-05-04 ENCOUNTER — Encounter: Payer: Self-pay | Admitting: Family Medicine

## 2016-05-04 VITALS — BP 130/84 | HR 99 | Resp 15 | Ht 67.0 in | Wt 356.4 lb

## 2016-05-04 DIAGNOSIS — E8881 Metabolic syndrome: Secondary | ICD-10-CM | POA: Diagnosis not present

## 2016-05-04 DIAGNOSIS — K219 Gastro-esophageal reflux disease without esophagitis: Secondary | ICD-10-CM | POA: Diagnosis not present

## 2016-05-04 DIAGNOSIS — R7303 Prediabetes: Secondary | ICD-10-CM | POA: Diagnosis not present

## 2016-05-04 DIAGNOSIS — I10 Essential (primary) hypertension: Secondary | ICD-10-CM

## 2016-05-04 DIAGNOSIS — Z1211 Encounter for screening for malignant neoplasm of colon: Secondary | ICD-10-CM | POA: Diagnosis not present

## 2016-05-04 DIAGNOSIS — J302 Other seasonal allergic rhinitis: Secondary | ICD-10-CM

## 2016-05-04 MED ORDER — POTASSIUM CHLORIDE CRYS ER 20 MEQ PO TBCR: 20.0000 meq | EXTENDED_RELEASE_TABLET | Freq: Every day | ORAL | 1 refills | Status: DC

## 2016-05-04 NOTE — Progress Notes (Signed)
Michele Lowe     MRN: [id number]      DOB: August 17, 1950   HPI Michele Lowe is here for follow up and re-evaluation of chronic medical conditions, medication management and review of any available recent lab and radiology data.  Preventive health is updated, specifically  Cancer screening and Immunization.   Questions or concerns regarding consultations or procedures which the PT has had in the interim are  addressed. The PT denies any adverse reactions to current medications since the last visit.  There are no new concerns.  There are no specific complaints   ROS Denies recent fever or chills. Denies sinus pressure, nasal congestion, ear pain or sore throat. Denies chest congestion, productive cough or wheezing. Denies chest pains, palpitations and leg swelling Denies abdominal pain, nausea, vomiting,diarrhea or constipation.   Denies dysuria, frequency, hesitancy or incontinence. Denies joint pain, swelling and limitation in mobility. Denies headaches, seizures, numbness, or tingling. Denies depression, anxiety or insomnia. Denies skin break down or rash.   PE  BP 130/84   Pulse 99   Resp 15   Ht 5\' 7"  (1.702 m)   Wt (!) 356 lb 6.4 oz (161.7 kg)   SpO2 96%   BMI 55.82 kg/m   Patient alert and oriented and in no cardiopulmonary distress.  HEENT: No facial asymmetry, EOMI,   oropharynx pink and moist.  Neck supple no JVD, no mass.  Chest: Clear to auscultation bilaterally.  CVS: S1, S2 no murmurs, no S3.Regular rate.  ABD: Soft non tender.   Ext: No edema  MS: Adequate ROM spine, shoulders, hips and knees.  Skin: Intact, no ulcerations or rash noted.  Psych: Good eye contact, normal affect. Memory intact not anxious or depressed appearing.  CNS: CN 2-12 intact, power,  normal throughout.no focal deficits noted.   Assessment & Plan  Essential hypertension Controlled, no change in medication DASH diet and commitment to daily physical activity for a  minimum of 30 minutes discussed and encouraged, as a part of hypertension management. The importance of attaining a healthy weight is also discussed.  BP/Weight 05/04/2016 01/07/2016 08/04/2015 12/02/2014 08/12/2014 07/02/2014 7/82/4235  Systolic BP 361 443 154 008 676 195 093  Diastolic BP 84 78 82 78 82 70 79  Wt. (Lbs) 356.4 344.12 342.04 335 337 - -  BMI 55.82 53.9 53.56 52.46 52.77 - -       MORBID OBESITY Deteriorated. Patient re-educated about  the importance of commitment to a  minimum of 150 minutes of exercise per week.  The importance of healthy food choices with portion control discussed. Encouraged to start a food diary, count calories and to consider  joining a support group. Sample diet sheets offered. Goals set by the patient for the next several months.   Weight /BMI 05/04/2016 01/07/2016 08/04/2015  WEIGHT 356 lb 6.4 oz 344 lb 1.9 oz 342 lb 0.6 oz  HEIGHT 5\' 7"  5\' 7"  5\' 7"   BMI 55.82 kg/m2 53.9 kg/m2 53.56 kg/m2      Prediabetes Patient educated about the importance of limiting  Carbohydrate intake , the need to commit to daily physical activity for a minimum of 30 minutes , and to commit weight loss. The fact that changes in all these areas will reduce or eliminate all together the development of diabetes is stressed.  Updated lab needed at/ before next visit.;   Diabetic Labs Latest Ref Rng & Units 08/21/2015 11/28/2014 08/09/2014 03/07/2014 10/19/2013  HbA1c <5.7 % 5.4 5.7(H) 5.8(H) 5.7(H) 6.0(H)  Chol 125 - 200 mg/dL - 143 154 - -  HDL >=46 mg/dL - 37(L) 34(L) - -  Calc LDL <130 mg/dL - 84 98 - -  Triglycerides <150 mg/dL - 111 111 - -  Creatinine 0.50 - 0.99 mg/dL 0.74 0.78 0.90 0.78 0.67   BP/Weight 05/04/2016 01/07/2016 08/04/2015 12/02/2014 08/12/2014 07/02/2014 2/95/6213  Systolic BP 086 578 469 629 528 413 244  Diastolic BP 84 78 82 78 82 70 79  Wt. (Lbs) 356.4 344.12 342.04 335 337 - -  BMI 55.82 53.9 53.56 52.46 52.77 - -   No flowsheet data  found.    GERD (gastroesophageal reflux disease) Controlled, no change in medication   Allergic rhinitis Controlled, no change in medication

## 2016-05-04 NOTE — Assessment & Plan Note (Signed)
Controlled, no change in medication  

## 2016-05-04 NOTE — Patient Instructions (Addendum)
   Thank you  for choosing [facility]. We consider it a privelige to serve you.  Delivering excellent health care in a caring and  compassionate way is our goal.  Partnering with you,  so that together we can achieve this goal is our strategy.   Annual physical exam early July 12 or after, call if you need me sooner  No med changes  Please work on new disciplined eating to help with weight management  Commit to exercise daily to improve health, 30 mins 5 days weekly is recommended   You are referred to Dr Laural Golden for average risk screening colonscopy  Fasting lipid, chem 7, hBA1C, cbc, tsh July 7 or shortly after

## 2016-05-04 NOTE — Assessment & Plan Note (Signed)
Deteriorated. Patient re-educated about  the importance of commitment to a  minimum of 150 minutes of exercise per week.  The importance of healthy food choices with portion control discussed. Encouraged to start a food diary, count calories and to consider  joining a support group. Sample diet sheets offered. Goals set by the patient for the next several months.   Weight /BMI 05/04/2016 01/07/2016 08/04/2015  WEIGHT 356 lb 6.4 oz 344 lb 1.9 oz 342 lb 0.6 oz  HEIGHT 5\' 7"  5\' 7"  5\' 7"   BMI 55.82 kg/m2 53.9 kg/m2 53.56 kg/m2

## 2016-05-04 NOTE — Assessment & Plan Note (Signed)
Controlled, no change in medication DASH diet and commitment to daily physical activity for a minimum of 30 minutes discussed and encouraged, as a part of hypertension management. The importance of attaining a healthy weight is also discussed.  BP/Weight 05/04/2016 01/07/2016 08/04/2015 12/02/2014 08/12/2014 07/02/2014 8/34/1962  Systolic BP 229 798 921 194 174 081 448  Diastolic BP 84 78 82 78 82 70 79  Wt. (Lbs) 356.4 344.12 342.04 335 337 - -  BMI 55.82 53.9 53.56 52.46 52.77 - -

## 2016-05-04 NOTE — Assessment & Plan Note (Signed)
Patient educated about the importance of limiting  Carbohydrate intake , the need to commit to daily physical activity for a minimum of 30 minutes , and to commit weight loss. The fact that changes in all these areas will reduce or eliminate all together the development of diabetes is stressed.  Updated lab needed at/ before next visit.;   Diabetic Labs Latest Ref Rng & Units 08/21/2015 11/28/2014 08/09/2014 03/07/2014 10/19/2013  HbA1c <5.7 % 5.4 5.7(H) 5.8(H) 5.7(H) 6.0(H)  Chol 125 - 200 mg/dL - 143 154 - -  HDL >=46 mg/dL - 37(L) 34(L) - -  Calc LDL <130 mg/dL - 84 98 - -  Triglycerides <150 mg/dL - 111 111 - -  Creatinine 0.50 - 0.99 mg/dL 0.74 0.78 0.90 0.78 0.67   BP/Weight 05/04/2016 01/07/2016 08/04/2015 12/02/2014 08/12/2014 07/02/2014 3/60/6770  Systolic BP 340 352 481 859 093 112 162  Diastolic BP 84 78 82 78 82 70 79  Wt. (Lbs) 356.4 344.12 342.04 335 337 - -  BMI 55.82 53.9 53.56 52.46 52.77 - -   No flowsheet data found.

## 2016-09-01 ENCOUNTER — Ambulatory Visit (INDEPENDENT_AMBULATORY_CARE_PROVIDER_SITE_OTHER): Payer: Medicare FFS | Admitting: Family Medicine

## 2016-09-01 ENCOUNTER — Encounter: Payer: Self-pay | Admitting: Family Medicine

## 2016-09-01 ENCOUNTER — Other Ambulatory Visit (HOSPITAL_COMMUNITY)
Admission: RE | Admit: 2016-09-01 | Discharge: 2016-09-01 | Disposition: A | Discharge status: Home / Self Care | Payer: Medicare FFS | Source: Ambulatory Visit | Attending: Family Medicine | Admitting: Family Medicine

## 2016-09-01 VITALS — BP 130/82 | HR 84 | Temp 97.6°F | Resp 16 | Ht 67.0 in | Wt 352.2 lb

## 2016-09-01 DIAGNOSIS — Z124 Encounter for screening for malignant neoplasm of cervix: Secondary | ICD-10-CM | POA: Insufficient documentation

## 2016-09-01 DIAGNOSIS — E8881 Metabolic syndrome: Secondary | ICD-10-CM

## 2016-09-01 DIAGNOSIS — Z0000 Encounter for general adult medical examination without abnormal findings: Secondary | ICD-10-CM

## 2016-09-01 DIAGNOSIS — Z1211 Encounter for screening for malignant neoplasm of colon: Secondary | ICD-10-CM

## 2016-09-01 DIAGNOSIS — R7303 Prediabetes: Secondary | ICD-10-CM | POA: Diagnosis not present

## 2016-09-01 DIAGNOSIS — I10 Essential (primary) hypertension: Secondary | ICD-10-CM | POA: Diagnosis not present

## 2016-09-01 LAB — CBC
HCT: 39.7 % (ref 35.0–45.0)
Hemoglobin: 12.9 g/dL (ref 11.7–15.5)
MCH: 28.3 pg (ref 27.0–33.0)
MCHC: 32.5 g/dL (ref 32.0–36.0)
MCV: 87.1 fL (ref 80.0–100.0)
MPV: 9.9 fL (ref 7.5–12.5)
PLATELETS: 281 10*3/uL (ref 140–400)
RBC: 4.56 MIL/uL (ref 3.80–5.10)
RDW: 14.6 % (ref 11.0–15.0)
WBC: 6.3 10*3/uL (ref 3.8–10.8)

## 2016-09-01 LAB — COMPREHENSIVE METABOLIC PANEL
ALBUMIN: 3.7 g/dL (ref 3.6–5.1)
ALK PHOS: 112 U/L (ref 33–130)
ALT: 21 U/L (ref 6–29)
AST: 22 U/L (ref 10–35)
BILIRUBIN TOTAL: 0.4 mg/dL (ref 0.2–1.2)
BUN: 12 mg/dL (ref 7–25)
CO2: 29 mmol/L (ref 20–31)
Calcium: 8.8 mg/dL (ref 8.6–10.4)
Chloride: 103 mmol/L (ref 98–110)
Creat: 0.81 mg/dL (ref 0.50–0.99)
Glucose, Bld: 92 mg/dL (ref 65–99)
Potassium: 3.8 mmol/L (ref 3.5–5.3)
SODIUM: 141 mmol/L (ref 135–146)
Total Protein: 6.3 g/dL (ref 6.1–8.1)

## 2016-09-01 LAB — LIPID PANEL
CHOLESTEROL: 169 mg/dL (ref <200)
HDL: 39 mg/dL — ABNORMAL LOW (ref >50)
LDL Cholesterol: 105 mg/dL — ABNORMAL HIGH (ref <100)
Total CHOL/HDL Ratio: 4.3 Ratio (ref <5.0)
Triglycerides: 123 mg/dL (ref <150)
VLDL: 25 mg/dL (ref <30)

## 2016-09-01 LAB — POC HEMOCCULT BLD/STL (OFFICE/1-CARD/DIAGNOSTIC): FECAL OCCULT BLD: NEGATIVE

## 2016-09-01 LAB — TSH: TSH: 1.68 mIU/L

## 2016-09-01 MED ORDER — PHENTERMINE HCL 37.5 MG PO TABS: 37.5000 mg | ORAL_TABLET | Freq: Every day | ORAL | 1 refills | Status: DC

## 2016-09-01 NOTE — Patient Instructions (Addendum)
Wellness mid Nov with nurse, same day as MD follow if possible call if you need me sooner\  4 monthf/u with MD, start half phentermine daily  Fasting labs toda, CBC, lipid, cmp, HBA1C, TSH, Vit D  It is important that you exercise regularly at least 30 minutes 5 times a week. If you develop chest pain, have severe difficulty breathing, or feel very tired, stop exercising immediately and seek medical attention    Please work on good  health habits so that your health will improve. 1. Commitment to daily physical activity for 30 to 60  minutes, if you are able to do this.  2. Commitment to wise food choices. Aim for half of your  food intake to be vegetable and fruit, one quarter starchy foods, and one quarter protein. Try to eat on a regular schedule  3 meals per day, snacking between meals should be limited to vegetables or fruits or small portions of nuts. 64 ounces of water per day is generally recommended, unless you have specific health conditions, like heart failure or kidney failure where you will need to limit fluid intake.  3. Commitment to sufficient and a  good quality of physical and mental rest daily, generally between 6 to 8 hours per day.  WITH PERSISTANCE AND PERSEVERANCE, THE IMPOSSIBLE , BECOMES THE NORM! Plls get colonscopy

## 2016-09-02 LAB — CYTOLOGY - PAP: DIAGNOSIS: NEGATIVE

## 2016-09-02 LAB — HEMOGLOBIN A1C
Hgb A1c MFr Bld: 5.6 % (ref <5.7)
MEAN PLASMA GLUCOSE: 114 mg/dL

## 2016-09-02 LAB — VITAMIN D 25 HYDROXY (VIT D DEFICIENCY, FRACTURES): Vit D, 25-Hydroxy: 39 ng/mL (ref 30–100)

## 2016-09-05 NOTE — Assessment & Plan Note (Signed)
Deteriorated. Patient re-educated about  the importance of commitment to a  minimum of 150 minutes of exercise per week.  The importance of healthy food choices with portion control discussed. Encouraged to start a food diary, count calories and to consider  joining a support group. Sample diet sheets offered. Goals set by the patient for the next several months.   Weight /BMI 09/01/2016 05/04/2016 01/07/2016  WEIGHT 352 lb 4 oz 356 lb 6.4 oz 344 lb 1.9 oz  HEIGHT 5\' 7"  5\' 7"  5\' 7"   BMI 55.17 kg/m2 55.82 kg/m2 53.9 kg/m2  Start phentermine half daily, weight loss goal of 4 pounds per month

## 2016-09-05 NOTE — Assessment & Plan Note (Signed)
Annual exam as documented. Counseling done  re healthy lifestyle involving commitment to 150 minutes exercise per week, heart healthy diet, and attaining healthy weight.The importance of adequate sleep also discussed. Changes in health habits are decided on by the patient with goals and time frames  set for achieving them. Immunization and cancer screening needs are specifically addressed at this visit. 

## 2016-09-05 NOTE — Progress Notes (Signed)
    Michele Lowe     MRN: [id number]      DOB: 23-Aug-1950  HPI: Patient is in for annual physical exam. C/o weight gain and wants to resume phentermine which has helped with weight loss in the past. Plans to work on necessary lifestyle change to facilitate weight loss also.  Immunization is reviewed , and  updated if needed.   PE: BP 130/82 (BP Location: Right Arm, Patient Position: Sitting, Cuff Size: Normal)   Pulse 84   Temp 97.6 F (36.4 C) (Other (Comment))   Resp 16   Ht 5\' 7"  (1.702 m)   Wt (!) 352 lb 4 oz (159.8 kg)   SpO2 93%   BMI 55.17 kg/m   Pleasant  female, alert and oriented x 3, in no cardio-pulmonary distress. Afebrile. HEENT No facial trauma or asymetry. Sinuses non tender.  Extra occullar muscles intact, . External ears normal, tympanic membranes clear. Oropharynx moist, no exudate. Neck: supple, no adenopathy,JVD or thyromegaly.No bruits.  Chest: Clear to ascultation bilaterally.No crackles or wheezes. Non tender to palpation  Breast: No asymetry,no masses or lumps. No tenderness. No nipple discharge or inversion. No axillary or supraclavicular adenopathy  Cardiovascular system; Heart sounds normal,  S1 and  S2 ,no S3.  No murmur, or thrill. Apical beat not displaced Peripheral pulses normal.  Abdomen: Soft, non tender, no organomegaly or masses. No bruits. Bowel sounds normal. No guarding, tenderness or rebound.  Rectal:  Normal sphincter tone. No rectal mass. Guaiac negative stool.  GU: External genitalia normal female genitalia , normal female distribution of hair. No lesions. Urethral meatus normal in size, no  Prolapse, no lesions visibly  Present. Bladder non tender. Vagina pink and moist , with no visible lesions , discharge present . Adequate pelvic support no  cystocele or rectocele noted  Uterus absent, no adnexal masses, no  adnexal tenderness.   Musculoskeletal exam: decreaseed though adequate  ROM of spine, hips ,  shoulders and knees. No deformity ,swelling or crepitus noted. No muscle wasting or atrophy.   Neurologic: Cranial nerves 2 to 12 intact. Power, tone ,sensation and reflexes normal throughout. No disturbance in gait. No tremor.  Skin: Intact, hyper pigmentation of lower extremities, anterior aspect , no skin breakdown, no rash or scaling noted  Psych; Normal mood and affect. Judgement and concentration normal   Assessment & Plan:  No problem-specific Assessment & Plan notes found for this encounter.

## 2016-09-06 ENCOUNTER — Telehealth: Payer: Self-pay | Admitting: Family Medicine

## 2016-09-06 NOTE — Telephone Encounter (Signed)
-----   Message from Fayrene Helper, MD sent at 09/05/2016 11:03 AM EDT ----- Please advise the patient that pap is normal. LABS ARE EXCELLENT EXCEPT "BAD CHOLESTEROL" LDL SLIGHTLY ABOVE GOAL OF UNDER 100, NEED TO CUT BACK ON FRIED AND FATTY FOODS. NO CHANGES IN MEDICATIOSN

## 2016-09-06 NOTE — Telephone Encounter (Signed)
Patient informed of message below, verbalized understanding.  

## 2016-09-28 ENCOUNTER — Other Ambulatory Visit: Payer: Self-pay | Admitting: Family Medicine

## 2016-10-04 ENCOUNTER — Other Ambulatory Visit: Payer: Self-pay | Admitting: Family Medicine

## 2016-10-04 NOTE — Telephone Encounter (Signed)
Seen [phone] 18 [phone] 

## 2016-10-26 ENCOUNTER — Other Ambulatory Visit: Payer: Self-pay | Admitting: Family Medicine

## 2016-11-08 ENCOUNTER — Ambulatory Visit: Payer: Medicare FFS

## 2017-01-03 ENCOUNTER — Ambulatory Visit (INDEPENDENT_AMBULATORY_CARE_PROVIDER_SITE_OTHER): Payer: Medicare FFS | Admitting: Family Medicine

## 2017-01-03 ENCOUNTER — Ambulatory Visit: Payer: Medicare FFS

## 2017-01-03 ENCOUNTER — Encounter: Payer: Self-pay | Admitting: Family Medicine

## 2017-01-03 VITALS — BP 114/80 | HR 94 | Resp 16 | Ht 67.0 in | Wt 338.0 lb

## 2017-01-03 DIAGNOSIS — J3089 Other allergic rhinitis: Secondary | ICD-10-CM

## 2017-01-03 DIAGNOSIS — I10 Essential (primary) hypertension: Secondary | ICD-10-CM

## 2017-01-03 DIAGNOSIS — Z1231 Encounter for screening mammogram for malignant neoplasm of breast: Secondary | ICD-10-CM

## 2017-01-03 MED ORDER — PHENTERMINE HCL 37.5 MG PO TABS: 37.5000 mg | ORAL_TABLET | Freq: Every day | ORAL | 1 refills | Status: DC

## 2017-01-03 NOTE — Patient Instructions (Signed)
Wellness with nurse in December if possible or after  Please go to radiology dept at hospital after you leave to syhedule your mammogram  CONGRATS on weight loss keep up the  GREAT work, food choice and portion control ARE the key!!  MD follow up in 4 months, call if you need me before  No antibiotic indicated for head and chest congestion today, if symptoms worsen in the next 1 week, please call me  Satrt once or twice daily saline nasal flushes please  Thank you  for choosing [facility]. We consider it a privelige to serve you.  Delivering excellent health care in a caring and  compassionate way is our goal.  Partnering with you,  so that together we can achieve this goal is our strategy.

## 2017-01-04 NOTE — Assessment & Plan Note (Signed)
Currently symptomatic, add daily saline nasal flushes, no indication for antibiotics at this time, pt to call if smptoms worsen in next 1 week

## 2017-01-04 NOTE — Assessment & Plan Note (Signed)
Improved Patient re-educated about  the importance of commitment to a  minimum of 150 minutes of exercise per week.  The importance of healthy food choices with portion control discussed. Encouraged to start a food diary, count calories and to consider  joining a support group. Sample diet sheets offered. Goals set by the patient for the next several months.   Weight /BMI 01/03/2017 09/01/2016 05/04/2016  WEIGHT 338 lb 352 lb 4 oz 356 lb 6.4 oz  HEIGHT 5\' 7"  5\' 7"  5\' 7"   BMI 52.94 kg/m2 55.17 kg/m2 55.82 kg/m2  Continue half phentermine daily

## 2017-01-04 NOTE — Assessment & Plan Note (Signed)
Controlled, no change in medication DASH diet and commitment to daily physical activity for a minimum of 30 minutes discussed and encouraged, as a part of hypertension management. The importance of attaining a healthy weight is also discussed.  BP/Weight 01/03/2017 09/01/2016 05/04/2016 01/07/2016 08/04/2015 12/02/2014 9/40/7680  Systolic BP 881 103 159 458 592 924 462  Diastolic BP 80 82 84 78 82 78 82  Wt. (Lbs) 338 352.25 356.4 344.12 342.04 335 337  BMI 52.94 55.17 55.82 53.9 53.56 52.46 52.77

## 2017-01-04 NOTE — Progress Notes (Signed)
   Michele Lowe     MRN: [id number]      DOB: 10/01/1950   HPI [name] is here for follow up and re-evaluation of chronic medical conditions, medication management and review of any available recent lab and radiology data.  Preventive health is updated, specifically  Cancer screening and Immunization.   Questions or concerns regarding consultations or procedures which the PT has had in the interim are  addressed. The PT denies any adverse reactions to current medications since the last visit. Excellent results with half phentermine daily has changed food choice and stuck to disciplined eating plan Had recent head congestion with cough, improving , drainage is "milky" but npo fever, feels well , no chills ROS  Denies sinus pressure, c/o mild nasal congestion denies current ear pain or sore throat. Denies chest congestion, productive cough or wheezing. Denies chest pains, palpitations and leg swelling Denies abdominal pain, nausea, vomiting,diarrhea or constipation.   Denies dysuria, frequency, hesitancy or incontinence. Denies joint pain, swelling and limitation in mobility. Denies headaches, seizures, numbness, or tingling. Denies depression, anxiety or insomnia. Denies skin break down or rash.   PE  BP 114/80   Pulse 94   Resp 16   Ht 5\' 7"  (1.702 m)   Wt (!) 338 lb (153.3 kg)   SpO2 94%   BMI 52.94 kg/m   Patient alert and oriented and in no cardiopulmonary distress.  HEENT: No facial asymmetry, EOMI,   oropharynx pink and moist.  Neck supple no JVD, no mass.No sinus tenderness, TM clear  Chest: Clear to auscultation bilaterally.  CVS: S1, S2 no murmurs, no S3.Regular rate.  ABD: Soft non tender.   Ext: No edema  MS: Adequate ROM spine, shoulders, hips and knees.  Skin: Intact, no ulcerations or rash noted.  Psych: Good eye contact, normal affect. Memory intact not anxious or depressed appearing.  CNS: CN 2-12 intact, power,  normal throughout.no focal  deficits noted.   Assessment & Plan Essential hypertension Controlled, no change in medication DASH diet and commitment to daily physical activity for a minimum of 30 minutes discussed and encouraged, as a part of hypertension management. The importance of attaining a healthy weight is also discussed.  BP/Weight 01/03/2017 09/01/2016 05/04/2016 01/07/2016 08/04/2015 12/02/2014 07/13/4130  Systolic BP 440 102 725 366 440 347 425  Diastolic BP 80 82 84 78 82 78 82  Wt. (Lbs) 338 352.25 356.4 344.12 342.04 335 337  BMI 52.94 55.17 55.82 53.9 53.56 52.46 52.77       MORBID OBESITY Improved Patient re-educated about  the importance of commitment to a  minimum of 150 minutes of exercise per week.  The importance of healthy food choices with portion control discussed. Encouraged to start a food diary, count calories and to consider  joining a support group. Sample diet sheets offered. Goals set by the patient for the next several months.   Weight /BMI 01/03/2017 09/01/2016 05/04/2016  WEIGHT 338 lb 352 lb 4 oz 356 lb 6.4 oz  HEIGHT 5\' 7"  5\' 7"  5\' 7"   BMI 52.94 kg/m2 55.17 kg/m2 55.82 kg/m2  Continue half phentermine daily    Allergic rhinitis Currently symptomatic, add daily saline nasal flushes, no indication for antibiotics at this time, pt to call if smptoms worsen in next 1 week

## 2017-01-14 ENCOUNTER — Encounter (HOSPITAL_COMMUNITY): Payer: Self-pay

## 2017-01-14 ENCOUNTER — Ambulatory Visit (HOSPITAL_COMMUNITY)
Admission: RE | Admit: 2017-01-14 | Discharge: 2017-01-14 | Disposition: A | Discharge status: Home / Self Care | Payer: Medicare FFS | Source: Ambulatory Visit | Attending: Family Medicine | Admitting: Family Medicine

## 2017-01-14 DIAGNOSIS — Z1231 Encounter for screening mammogram for malignant neoplasm of breast: Secondary | ICD-10-CM

## 2017-01-24 ENCOUNTER — Ambulatory Visit: Payer: Medicare FFS

## 2017-05-04 ENCOUNTER — Ambulatory Visit: Payer: Medicare FFS | Admitting: Family Medicine

## 2017-06-01 ENCOUNTER — Ambulatory Visit (INDEPENDENT_AMBULATORY_CARE_PROVIDER_SITE_OTHER): Payer: Medicare FFS | Admitting: Family Medicine

## 2017-06-01 ENCOUNTER — Encounter: Payer: Self-pay | Admitting: Family Medicine

## 2017-06-01 ENCOUNTER — Telehealth: Payer: Self-pay | Admitting: Family Medicine

## 2017-06-01 VITALS — BP 118/80 | HR 85 | Resp 16 | Ht 67.0 in | Wt 335.0 lb

## 2017-06-01 DIAGNOSIS — M25561 Pain in right knee: Secondary | ICD-10-CM

## 2017-06-01 DIAGNOSIS — I10 Essential (primary) hypertension: Secondary | ICD-10-CM | POA: Diagnosis not present

## 2017-06-01 DIAGNOSIS — J3089 Other allergic rhinitis: Secondary | ICD-10-CM | POA: Diagnosis not present

## 2017-06-01 MED ORDER — IBUPROFEN 800 MG PO TABS: ORAL_TABLET | ORAL | 0 refills | Status: DC

## 2017-06-01 MED ORDER — TRIAMTERENE-HCTZ 37.5-25 MG PO TABS: ORAL_TABLET | ORAL | 3 refills | Status: DC

## 2017-06-01 MED ORDER — PHENTERMINE HCL 37.5 MG PO TABS: 37.5000 mg | ORAL_TABLET | Freq: Every day | ORAL | 1 refills | Status: DC

## 2017-06-01 MED ORDER — POTASSIUM CHLORIDE CRYS ER 20 MEQ PO TBCR: 20.0000 meq | EXTENDED_RELEASE_TABLET | Freq: Every day | ORAL | 3 refills | Status: DC

## 2017-06-01 NOTE — Patient Instructions (Addendum)
Wellness with nurse is past due , please schedule  Fasting cBC, lipid, cmp andn eGFR, TSH and vit D 1 week before JUlY visit please Physical exam with MD July 19 or after, call if you need me sooner  Congrats on 21 pound weight loss in past 1 year  Please work on 8 to 10 pound weight loss for next 4 months  New is ibuprofen fopr sparing use for knee pain when it fllares, limit to 3 days only  Use tylenol more regularly if needed. As youi lose weight , pain lessens  RECONSIDER cOLONOSCOPY 

## 2017-06-01 NOTE — Telephone Encounter (Signed)
Done

## 2017-06-01 NOTE — Telephone Encounter (Signed)
Needs refills for potassium

## 2017-06-13 NOTE — Assessment & Plan Note (Addendum)
Improved with weigtt loss though still persists Encouraged ongoing use of tylenol as needed, but more importantly weight loss and also encouraged her to do water aerobics, ibuprofen added forsparing use for acute pain flares

## 2017-06-13 NOTE — Assessment & Plan Note (Signed)
Controlled, no change in medication DASH diet and commitment to daily physical activity for a minimum of 30 minutes discussed and encouraged, as a part of hypertension management. The importance of attaining a healthy weight is also discussed.  BP/Weight 06/01/2017 01/03/2017 09/01/2016 05/04/2016 01/07/2016 08/04/2015 64/38/3818  Systolic BP 403 754 360 677 034 035 248  Diastolic BP 80 80 82 84 78 82 78  Wt. (Lbs) 335 338 352.25 356.4 344.12 342.04 335  BMI 52.47 52.94 55.17 55.82 53.9 53.56 52.46

## 2017-06-13 NOTE — Assessment & Plan Note (Signed)
Controlled, no change in medication  

## 2017-06-13 NOTE — Progress Notes (Signed)
Michele Lowe     MRN: [id number]      DOB: Feb 19, 1950   HPI [name] is here for follow up and re-evaluation of chronic medical conditions, medication management and review of any available recent lab and radiology data.  Preventive health is updated, specifically  Cancer screening and Immunization.   Questions or concerns regarding consultations or procedures which the PT has had in the interim are  addressed. The PT denies any adverse reactions to current medications since the last visit.  C/o right knee pain and some instability, has not had any falls or near falls. Pain is rated at a 2 and responds to tylenol Working on weight loss through change in food choice and exercise with the help of medication   ROS Denies recent fever or chills. Denies sinus pressure, nasal congestion, ear pain or sore throat. Denies chest congestion, productive cough or wheezing. Denies chest pains, palpitations and leg swelling Denies abdominal pain, nausea, vomiting,diarrhea or constipation.   Denies dysuria, frequency, hesitancy or incontinence.  Denies headaches, seizures, numbness, or tingling. Denies depression, anxiety or insomnia. Denies skin break down or rash.   PE  BP 118/80   Pulse 85   Resp 16   Ht 5\' 7"  (1.702 m)   Wt (!) 335 lb (152 kg)   SpO2 96%   BMI 52.47 kg/m   Patient alert and oriented and in no cardiopulmonary distress.  HEENT: No facial asymmetry, EOMI,   oropharynx pink and moist.  Neck supple no JVD, no mass.  Chest: Clear to auscultation bilaterally.  CVS: S1, S2 no murmurs, no S3.Regular rate.  ABD: Soft non tender.   Ext: No edema  MS: Adequate ROM spine, shoulders, hips and reduced in right knee.  Skin: Intact, no ulcerations or rash noted.  Psych: Good eye contact, normal affect. Memory intact not anxious or depressed appearing.  CNS: CN 2-12 intact, power,  normal throughout.no focal deficits noted.   Assessment & Plan Essential  hypertension Controlled, no change in medication DASH diet and commitment to daily physical activity for a minimum of 30 minutes discussed and encouraged, as a part of hypertension management. The importance of attaining a healthy weight is also discussed.  BP/Weight 06/01/2017 01/03/2017 09/01/2016 05/04/2016 01/07/2016 08/04/2015 22/63/3354  Systolic BP 562 563 893 734 287 681 157  Diastolic BP 80 80 82 84 78 82 78  Wt. (Lbs) 335 338 352.25 356.4 344.12 342.04 335  BMI 52.47 52.94 55.17 55.82 53.9 53.56 52.46       KNEE PAIN, RIGHT Improved with weigtt loss though still persists Encouraged ongoing use of tylenol as needed, but more importantly weight loss and also encouraged her to do water aerobics, ibuprofen added forsparing use for acute pain flares  MORBID OBESITY Deteriorated.Improved slightly , but not as expected. Patieient re-educated about  the importance of commitment to a  minimum of 150 minutes of exercise per week.  The importance of healthy food choices with portion control discussed. Encouraged to start a food diary, count calories and to consider  joining a support group. Sample diet sheets offered. Goals set by the patient for the next several months.   Weight /BMI 06/01/2017 01/03/2017 09/01/2016  WEIGHT 335 lb 338 lb 352 lb 4 oz  HEIGHT 5\' 7"  5\' 7"  5\' 7"   BMI 52.47 kg/m2 52.94 kg/m2 55.17 kg/m2   Weight loss gola of 3 pounds per month, minimum, continue phentermine as before   Allergic rhinitis Controlled, no change in medication

## 2017-06-13 NOTE — Assessment & Plan Note (Signed)
Deteriorated.Improved slightly , but not as expected. Patieient re-educated about  the importance of commitment to a  minimum of 150 minutes of exercise per week.  The importance of healthy food choices with portion control discussed. Encouraged to start a food diary, count calories and to consider  joining a support group. Sample diet sheets offered. Goals set by the patient for the next several months.   Weight /BMI 06/01/2017 01/03/2017 09/01/2016  WEIGHT 335 lb 338 lb 352 lb 4 oz  HEIGHT 5\' 7"  5\' 7"  5\' 7"   BMI 52.47 kg/m2 52.94 kg/m2 55.17 kg/m2   Weight loss gola of 3 pounds per month, minimum, continue phentermine as before

## 2017-08-10 ENCOUNTER — Telehealth: Payer: Self-pay | Admitting: Family Medicine

## 2017-08-10 DIAGNOSIS — R319 Hematuria, unspecified: Secondary | ICD-10-CM

## 2017-08-10 NOTE — Telephone Encounter (Signed)
Order sent to [facility]

## 2017-08-10 NOTE — Telephone Encounter (Signed)
Spotting , seeing some blood intermittently for the past week, no pain , no fever was worse last week , no flank pain, mos t recent was yesterday, denies vaginal symptoms , and not current;ly sexually active, pt is aware a lab order will be sent in and she will get  CCUA and c/s sent to the lab next door, pls send  Order to [facility]

## 2017-08-10 NOTE — Telephone Encounter (Signed)
Patient requesting an appt asap for possible UTI. States she has been urinating blood since yesterday. No pain. Cb#: [phone]

## 2017-08-12 LAB — URINE CULTURE
MICRO NUMBER: 90769112
SPECIMEN QUALITY:: ADEQUATE

## 2017-08-12 LAB — URINALYSIS
Bilirubin Urine: NEGATIVE
Glucose, UA: NEGATIVE
Ketones, ur: NEGATIVE
Nitrite: NEGATIVE
PROTEIN: NEGATIVE
Specific Gravity, Urine: 1.022 (ref 1.001–1.03)
pH: 6 (ref 5.0–8.0)

## 2017-09-05 ENCOUNTER — Ambulatory Visit (INDEPENDENT_AMBULATORY_CARE_PROVIDER_SITE_OTHER): Payer: Medicare FFS | Admitting: *Deleted

## 2017-09-05 ENCOUNTER — Other Ambulatory Visit: Payer: Self-pay

## 2017-09-05 ENCOUNTER — Ambulatory Visit (INDEPENDENT_AMBULATORY_CARE_PROVIDER_SITE_OTHER): Payer: Medicare FFS | Admitting: Family Medicine

## 2017-09-05 ENCOUNTER — Encounter: Payer: Self-pay | Admitting: Family Medicine

## 2017-09-05 VITALS — BP 132/76 | HR 96 | Temp 97.6°F | Ht 67.0 in | Wt 326.0 lb

## 2017-09-05 VITALS — BP 122/82 | HR 79 | Ht 67.0 in | Wt 326.0 lb

## 2017-09-05 DIAGNOSIS — Z0000 Encounter for general adult medical examination without abnormal findings: Secondary | ICD-10-CM

## 2017-09-05 DIAGNOSIS — I10 Essential (primary) hypertension: Secondary | ICD-10-CM | POA: Diagnosis not present

## 2017-09-05 DIAGNOSIS — Z1231 Encounter for screening mammogram for malignant neoplasm of breast: Secondary | ICD-10-CM

## 2017-09-05 LAB — COMPLETE METABOLIC PANEL WITH GFR
AG Ratio: 1.3 (calc) (ref 1.0–2.5)
ALKALINE PHOSPHATASE (APISO): 102 U/L (ref 33–130)
ALT: 18 U/L (ref 6–29)
AST: 21 U/L (ref 10–35)
Albumin: 4.1 g/dL (ref 3.6–5.1)
BILIRUBIN TOTAL: 0.5 mg/dL (ref 0.2–1.2)
BUN: 15 mg/dL (ref 7–25)
CHLORIDE: 101 mmol/L (ref 98–110)
CO2: 34 mmol/L — AB (ref 20–32)
Calcium: 9.4 mg/dL (ref 8.6–10.4)
Creat: 0.91 mg/dL (ref 0.50–0.99)
GFR, Est African American: 75 mL/min/{1.73_m2} (ref >=60)
GFR, Est Non African American: 65 mL/min/{1.73_m2} (ref >=60)
Globulin: 3.1 g/dL (calc) (ref 1.9–3.7)
Glucose, Bld: 101 mg/dL — ABNORMAL HIGH (ref 65–99)
Potassium: 3.6 mmol/L (ref 3.5–5.3)
Sodium: 142 mmol/L (ref 135–146)
TOTAL PROTEIN: 7.2 g/dL (ref 6.1–8.1)

## 2017-09-05 LAB — CBC
HCT: 39.2 % (ref 35.0–45.0)
Hemoglobin: 12.7 g/dL (ref 11.7–15.5)
MCH: 27.1 pg (ref 27.0–33.0)
MCHC: 32.4 g/dL (ref 32.0–36.0)
MCV: 83.8 fL (ref 80.0–100.0)
MPV: 10.5 fL (ref 7.5–12.5)
PLATELETS: 275 10*3/uL (ref 140–400)
RBC: 4.68 10*6/uL (ref 3.80–5.10)
RDW: 14 % (ref 11.0–15.0)
WBC: 6.7 10*3/uL (ref 3.8–10.8)

## 2017-09-05 LAB — LIPID PANEL
Cholesterol: 163 mg/dL (ref <200)
HDL: 41 mg/dL — AB (ref >50)
LDL CHOLESTEROL (CALC): 99 mg/dL
Non-HDL Cholesterol (Calc): 122 mg/dL (calc) (ref <130)
TRIGLYCERIDES: 124 mg/dL (ref <150)
Total CHOL/HDL Ratio: 4 (calc) (ref <5.0)

## 2017-09-05 LAB — TSH: TSH: 1.04 m[IU]/L (ref 0.40–4.50)

## 2017-09-05 MED ORDER — PHENTERMINE HCL 37.5 MG PO TABS: 37.5000 mg | ORAL_TABLET | Freq: Every day | ORAL | 1 refills | Status: DC

## 2017-09-05 NOTE — Patient Instructions (Signed)
[name] , Thank you for taking time to come for your Medicare Wellness Visit. I appreciate your ongoing commitment to your health goals. Please review the following plan we discussed and let me know if I can assist you in the future.   Screening recommendations/referrals: Colonoscopy: we will refer to gastro Mammogram: up to date  Bone Density: up to date Recommended yearly ophthalmology/optometry visit for glaucoma screening and checkup Recommended yearly dental visit for hygiene and checkup  Vaccinations: Influenza vaccine: due in the fall  Pneumococcal vaccine: up to date Tdap vaccine: ask insurance Shingles vaccine: ask insurance    Advanced directives: info provided  Conditions/risks identified: discussed during visit  Next appointment: 09/05/17 9:20am  Preventive Care 69 Years and Older, Female Preventive care refers to lifestyle choices and visits with your health care provider that can promote health and wellness. What does preventive care include?  A yearly physical exam. This is also called an annual well check.  Dental exams once or twice a year.  Routine eye exams. Ask your health care provider how often you should have your eyes checked.  Personal lifestyle choices, including:  Daily care of your teeth and gums.  Regular physical activity.  Eating a healthy diet.  Avoiding tobacco and drug use.  Limiting alcohol use.  Practicing safe sex.  Taking low-dose aspirin every day.  Taking vitamin and mineral supplements as recommended by your health care provider. What happens during an annual well check? The services and screenings done by your health care provider during your annual well check will depend on your age, overall health, lifestyle risk factors, and family history of disease. Counseling  Your health care provider may ask you questions about your:  Alcohol use.  Tobacco use.  Drug use.  Emotional well-being.  Home and relationship  well-being.  Sexual activity.  Eating habits.  History of falls.  Memory and ability to understand (cognition).  Work and work Statistician.  Reproductive health. Screening  You may have the following tests or measurements:  Height, weight, and BMI.  Blood pressure.  Lipid and cholesterol levels. These may be checked every 5 years, or more frequently if you are over 87 years old.  Skin check.  Lung cancer screening. You may have this screening every year starting at age 11 if you have a 30-pack-year history of smoking and currently smoke or have quit within the past 15 years.  Fecal occult blood test (FOBT) of the stool. You may have this test every year starting at age 61.  Flexible sigmoidoscopy or colonoscopy. You may have a sigmoidoscopy every 5 years or a colonoscopy every 10 years starting at age 33.  Hepatitis C blood test.  Hepatitis B blood test.  Sexually transmitted disease (STD) testing.  Diabetes screening. This is done by checking your blood sugar (glucose) after you have not eaten for a while (fasting). You may have this done every 1-3 years.  Bone density scan. This is done to screen for osteoporosis. You may have this done starting at age 72.  Mammogram. This may be done every 1-2 years. Talk to your health care provider about how often you should have regular mammograms. Talk with your health care provider about your test results, treatment options, and if necessary, the need for more tests. Vaccines  Your health care provider may recommend certain vaccines, such as:  Influenza vaccine. This is recommended every year.  Tetanus, diphtheria, and acellular pertussis (Tdap, Td) vaccine. You may need a Td booster  every 10 years.  Zoster vaccine. You may need this after age 22.  Pneumococcal 13-valent conjugate (PCV13) vaccine. One dose is recommended after age 14.  Pneumococcal polysaccharide (PPSV23) vaccine. One dose is recommended after age  75. Talk to your health care provider about which screenings and vaccines you need and how often you need them. This information is not intended to replace advice given to you by your health care provider. Make sure you discuss any questions you have with your health care provider. Document Released: 02/28/2015 Document Revised: 10/22/2015 Document Reviewed: 12/03/2014 Elsevier Interactive Patient Education  2017 [place] Prevention in the Home Falls can cause injuries. They can happen to people of all ages. There are many things you can do to make your home safe and to help prevent falls. What can I do on the outside of my home?  Regularly fix the edges of walkways and driveways and fix any cracks.  Remove anything that might make you trip as you walk through a door, such as a raised step or threshold.  Trim any bushes or trees on the path to your home.  Use bright outdoor lighting.  Clear any walking paths of anything that might make someone trip, such as rocks or tools.  Regularly check to see if handrails are loose or broken. Make sure that both sides of any steps have handrails.  Any raised decks and porches should have guardrails on the edges.  Have any leaves, snow, or ice cleared regularly.  Use sand or salt on walking paths during winter.  Clean up any spills in your garage right away. This includes oil or grease spills. What can I do in the bathroom?  Use night lights.  Install grab bars by the toilet and in the tub and shower. Do not use towel bars as grab bars.  Use non-skid mats or decals in the tub or shower.  If you need to sit down in the shower, use a plastic, non-slip stool.  Keep the floor dry. Clean up any water that spills on the floor as soon as it happens.  Remove soap buildup in the tub or shower regularly.  Attach bath mats securely with double-sided non-slip rug tape.  Do not have throw rugs and other things on the floor that can make  you trip. What can I do in the bedroom?  Use night lights.  Make sure that you have a light by your bed that is easy to reach.  Do not use any sheets or blankets that are too big for your bed. They should not hang down onto the floor.  Have a firm chair that has side arms. You can use this for support while you get dressed.  Do not have throw rugs and other things on the floor that can make you trip. What can I do in the kitchen?  Clean up any spills right away.  Avoid walking on wet floors.  Keep items that you use a lot in easy-to-reach places.  If you need to reach something above you, use a strong step stool that has a grab bar.  Keep electrical cords out of the way.  Do not use floor polish or wax that makes floors slippery. If you must use wax, use non-skid floor wax.  Do not have throw rugs and other things on the floor that can make you trip. What can I do with my stairs?  Do not leave any items on the stairs.  Make  sure that there are handrails on both sides of the stairs and use them. Fix handrails that are broken or loose. Make sure that handrails are as long as the stairways.  Check any carpeting to make sure that it is firmly attached to the stairs. Fix any carpet that is loose or worn.  Avoid having throw rugs at the top or bottom of the stairs. If you do have throw rugs, attach them to the floor with carpet tape.  Make sure that you have a light switch at the top of the stairs and the bottom of the stairs. If you do not have them, ask someone to add them for you. What else can I do to help prevent falls?  Wear shoes that:  Do not have high heels.  Have rubber bottoms.  Are comfortable and fit you well.  Are closed at the toe. Do not wear sandals.  If you use a stepladder:  Make sure that it is fully opened. Do not climb a closed stepladder.  Make sure that both sides of the stepladder are locked into place.  Ask someone to hold it for you, if  possible.  Clearly mark and make sure that you can see:  Any grab bars or handrails.  First and last steps.  Where the edge of each step is.  Use tools that help you move around (mobility aids) if they are needed. These include:  Canes.  Walkers.  Scooters.  Crutches.  Turn on the lights when you go into a dark area. Replace any light bulbs as soon as they burn out.  Set up your furniture so you have a clear path. Avoid moving your furniture around.  If any of your floors are uneven, fix them.  If there are any pets around you, be aware of where they are.  Review your medicines with your doctor. Some medicines can make you feel dizzy. This can increase your chance of falling. Ask your doctor what other things that you can do to help prevent falls. This information is not intended to replace advice given to you by your health care provider. Make sure you discuss any questions you have with your health care provider. Document Released: 11/28/2008 Document Revised: 07/10/2015 Document Reviewed: 03/08/2014 Elsevier Interactive Patient Education  2017 [employer].

## 2017-09-05 NOTE — Assessment & Plan Note (Signed)
Controlled, no change in medication DASH diet and commitment to daily physical activity for a minimum of 30 minutes discussed and encouraged, as a part of hypertension management. The importance of attaining a healthy weight is also discussed.  BP/Weight 09/05/2017 09/05/2017 06/01/2017 01/03/2017 09/01/2016 05/04/2016 92/23/0097  Systolic BP 949 971 820 990 689 340 684  Diastolic BP 76 82 80 80 82 84 78  Wt. (Lbs) 326.04 326 335 338 352.25 356.4 344.12  BMI 51.07 51.06 52.47 52.94 55.17 55.82 53.9

## 2017-09-05 NOTE — Progress Notes (Signed)
Subjective:   CORABELLE SPACKMAN is a 67 y.o. female who presents for Medicare Annual (Subsequent) preventive examination.  Review of Systems:   Cardiac Risk Factors include: advanced age (>78men, >70 women);hypertension;obesity (BMI >30kg/m2);sedentary lifestyle     Objective:     Vitals: BP 132/76   Pulse 96   Temp 97.6 F (36.4 C)   Ht 5\' 7"  (1.702 m)   Wt (!) 326 lb 0.6 oz (147.9 kg)   SpO2 96%   BMI 51.07 kg/m   Body mass index is 51.07 kg/m.  Advanced Directives 09/05/2017 09/01/2016  Does Patient Have a Medical Advance Directive? No No  Would patient like information on creating a medical advance directive? Yes (ED - Information included in AVS) No - Patient declined    Tobacco Social History   Tobacco Use  Smoking Status Never Smoker  Smokeless Tobacco Never Used     Counseling given: Not Answered   Clinical Intake:     Pain Score: 0-No pain                 Past Medical History:  Diagnosis Date  . Allergy   . Arthritis   . Cancer ([place]) 06/20/2014   MELANOMA, LEFT  LOWER ARM  . Essential hypertension, benign 2009  . Morbid obesity ([place])   . Rhinosinusitis    Past Surgical History:  Procedure Laterality Date  . ABDOMINAL HYSTERECTOMY     Partial   . JOINT REPLACEMENT  05/13/2011   Revision of left knee replacement in 2008  . MOLE REMOVAL  06/20/2014   Dr Harriet Masson  . Partial hysterectomy ,secondary to fibroid . Pt still have both ovaries  1978  . Rt. forearm surgery for nerve repair  1989  . TOTAL KNEE ARTHROPLASTY  2007   Right    Family History  Problem Relation Age of Onset  . Heart failure Mother   . Hypertension Mother   . Heart disease Mother        Irregular heart valve  . Hypertension Father   . Emphysema Father   . Heart disease Father   . COPD Father   . Diabetes Brother   . Heart failure Brother   . Diabetes Brother   . Diabetes Brother   . Hyperlipidemia Brother   . Hypertension Sister   . Hypertension Sister      Social History   Socioeconomic History  . Marital status: Divorced    Spouse name: Not on file  . Number of children: 3  . Years of education: Not on file  . Highest education level: Not on file  Occupational History  . Occupation: Disabled   Social Needs  . Financial resource strain: Not hard at all  . Food insecurity:    Worry: Never true    Inability: Never true  . Transportation needs:    Medical: No    Non-medical: No  Tobacco Use  . Smoking status: Never Smoker  . Smokeless tobacco: Never Used  Substance and Sexual Activity  . Alcohol use: No  . Drug use: No  . Sexual activity: Not Currently  Lifestyle  . Physical activity:    Days per week: 0 days    Minutes per session: 0 min  . Stress: Not at all  Relationships  . Social connections:    Talks on phone: More than three times a week    Gets together: More than three times a week    Attends religious service: More than 4  times per year    Active member of club or organization: Yes    Attends meetings of clubs or organizations: More than 4 times per year    Relationship status: Divorced  Other Topics Concern  . Not on file  Social History Narrative  . Not on file    Outpatient Encounter Medications as of 09/05/2017  Medication Sig  . aspirin (ASPIRIN LOW DOSE) 81 MG EC tablet Take 81 mg by mouth daily.    . calcium-vitamin D (OSCAL WITH D) 500-200 MG-UNIT per tablet Take 1 tablet by mouth 3 (three) times daily.    Marland Kitchen Cod Liver Oil CAPS Take by mouth daily.    . fluticasone (FLONASE) 50 MCG/ACT nasal spray Place 2 sprays into both nostrils daily.  . furosemide (LASIX) 40 MG tablet TAKE 1 TABLET EVERY DAY  . Glucosamine-Chondroit-Vit C-Mn (GLUCOSAMINE 1500 COMPLEX PO) Take 1 capsule by mouth daily.  Marland Kitchen ibuprofen (ADVIL,MOTRIN) 800 MG tablet One tablet twice daily , as needed , for acute knee pain  Do not take for more than 3 consecutive days  . loratadine (CLARITIN) 10 MG tablet Take 1 tablet (10 mg total) by  mouth daily.  . Multiple Vitamin (MULTIVITAMIN PO) Take by mouth daily.    . phentermine (ADIPEX-P) 37.5 MG tablet Take 1 tablet (37.5 mg total) by mouth daily before breakfast.  . potassium chloride SA (KLOR-CON M20) 20 MEQ tablet Take 1 tablet (20 mEq total) by mouth daily.  Marland Kitchen triamterene-hydrochlorothiazide (MAXZIDE-25) 37.5-25 MG tablet TAKE 1 AND 1/2 TABLETS ONE TIME DAILY   No facility-administered encounter medications on file as of 09/05/2017.     Activities of Daily Living In your present state of health, do you have any difficulty performing the following activities: 09/05/2017  Hearing? N  Vision? N  Difficulty concentrating or making decisions? N  Walking or climbing stairs? Y  Dressing or bathing? N  Doing errands, shopping? N  Preparing Food and eating ? N  Using the Toilet? N  In the past six months, have you accidently leaked urine? N  Do you have problems with loss of bowel control? N  Managing your Medications? N  Managing your Finances? N  Housekeeping or managing your Housekeeping? N  Some recent data might be hidden    Patient Care Team: Fayrene Helper, MD as PCP - General Devonne Doughty, MD as Referring Physician (Orthopedic Surgery) Satira Sark, MD as Consulting Physician (Cardiology) Garrel Ridgel, DPM as Consulting Physician (Podiatry)    Assessment:   This is a routine wellness examination for [place].  Exercise Activities and Dietary recommendations Current Exercise Habits: The patient does not participate in regular exercise at present, Exercise limited by: orthopedic condition(s)  Goals    None      Fall Risk Fall Risk  09/05/2017 06/01/2017 09/01/2016 01/07/2016 08/12/2014  Falls in the past year? No No No No No   Is the patient's home free of loose throw rugs in walkways, pet beds, electrical cords, etc?   no      Grab bars in the bathroom? no      Handrails on the stairs?   yes      Adequate lighting?   yes  Timed Get Up and  Go performed:   Depression Screen PHQ 2/9 Scores 09/05/2017 06/01/2017 09/01/2016 01/07/2016  PHQ - 2 Score 0 0 0 0  PHQ- 9 Score - - - -     Cognitive Function     6CIT  Screen 09/05/2017  What Year? 0 points  What month? 0 points  What time? 0 points  Count back from 20 0 points  Months in reverse 0 points  Repeat phrase 6 points  Total Score 6    Immunization History  Administered Date(s) Administered  . Influenza Whole 11/23/2006  . Pneumococcal Conjugate-13 10/31/2013  . Pneumococcal Polysaccharide-23 08/04/2015  . Td 11/23/2006  . Zoster 02/13/2009    Qualifies for Shingles Vaccine?ask insurance  Screening Tests Health Maintenance  Topic Date Due  . COLONOSCOPY  02/23/2016  . TETANUS/TDAP  01/03/2018 (Originally 11/22/2016)  . INFLUENZA VACCINE  09/15/2017  . MAMMOGRAM  01/15/2019  . DEXA SCAN  Completed  . Hepatitis C Screening  Completed  . PNA vac Low Risk Adult  Completed    Cancer Screenings: Lung: Low Dose CT Chest recommended if Age 32-80 years, 30 pack-year currently smoking OR have quit w/in 15years. Patient does not qualify. Breast:  Up to date on Mammogram? Yes   Up to date of Bone Density/Dexa? Yes Colorectal: no  Additional Screenings:  Hepatitis C Screening:      Plan:     I have personally reviewed and noted the following in the patient's chart:   . Medical and social history . Use of alcohol, tobacco or illicit drugs  . Current medications and supplements . Functional ability and status . Nutritional status . Physical activity . Advanced directives . List of other physicians . Hospitalizations, surgeries, and ER visits in previous 12 months . Vitals . Screenings to include cognitive, depression, and falls . Referrals and appointments  In addition, I have reviewed and discussed with patient certain preventive protocols, quality metrics, and best practice recommendations. A written personalized care plan for preventive services as  well as general preventive health recommendations were provided to patient.     Willette Pa, LPN  9/89/2119

## 2017-09-05 NOTE — Progress Notes (Signed)
Michele Lowe     MRN: [id number]      DOB: [birth year]  HPI: Patient is in for annual physical exam. Obesity management is addressed Recent labs, are reviewed. Immunization is reviewed , and  Is up to date. Opting for cologuard over colonoscopy, does not want that Has worked on change in diet and also more diligence with exercise with resultant 30 pound weight loss since last  16 months  which is good PE: BP 122/82 (BP Location: Left Arm, Patient Position: Sitting, Cuff Size: Large)   Pulse 79   Ht 5\' 7"  (1.702 m)   Wt (!) 326 lb (147.9 kg)   SpO2 99%   BMI 51.06 kg/m   [place]  female, alert and oriented x 3, in no cardio-pulmonary distress. Afebrile. HEENT No facial trauma or asymetry. Sinuses non tender.  Extra occullar muscles intact,  External ears normal, tympanic membranes clear. Oropharynx moist, no exudate. Neck: supple, no adenopathy,JVD or thyromegaly.No bruits.  Chest: Clear to ascultation bilaterally.No crackles or wheezes. Non tender to palpation  Breast: No asymetry,no masses or lumps. No tenderness. No nipple discharge or inversion. No axillary or supraclavicular adenopathy  Cardiovascular system; Heart sounds normal,  S1 and  S2 ,no S3.  No murmur, or thrill. Apical beat not displaced Peripheral pulses normal.  Abdomen: Soft, non tender, no organomegaly or masses. No bruits. Bowel sounds normal. No guarding, tenderness or rebound.  Rectal:  Not done, pt to do cologuard test  GU: Asymptomatic , not examined   Musculoskeletal exam: Decreased l ROM of spine, hips , shoulders and knees. deformity ,swelling and  crepitus noted. No muscle wasting or atrophy.   Neurologic: Cranial nerves 2 to 12 intact. Power, tone ,sensation and reflexes normal throughout. disturbance in gait. No tremor.  Skin: Intact, no ulceration, hyperpigmentation and rash noted. On both shins which is chronic   Psych; Normal mood and affect. Judgement and  concentration normal   Assessment & Plan:  Annual physical exam Annual exam as documented. Counseling done  re healthy lifestyle involving commitment to 150 minutes exercise per week, heart healthy diet, and attaining healthy weight.The importance of adequate sleep also discussed. Regular seat belt use and home safety, is also discussed. Changes in health habits are decided on by the patient with goals and time frames  set for achieving them. Immunization and cancer screening needs are specifically addressed at this visit.   Essential hypertension Controlled, no change in medication DASH diet and commitment to daily physical activity for a minimum of 30 minutes discussed and encouraged, as a part of hypertension management. The importance of attaining a healthy weight is also discussed.  BP/Weight 09/05/2017 09/05/2017 06/01/2017 01/03/2017 09/01/2016 05/04/2016 50/56/9794  Systolic BP 801 655 374 827 078 675 449  Diastolic BP 76 82 80 80 82 84 78  Wt. (Lbs) 326.04 326 335 338 352.25 356.4 344.12  BMI 51.07 51.06 52.47 52.94 55.17 55.82 53.9       MORBID OBESITY Improved, 9 pound weight loss since last visit, she o is applauded on this an will continue half phentermine daily. Patient re-educated about  the importance of commitment to a  minimum of 150 minutes of exercise per week.  The importance of healthy food choices with portion control discussed. Encouraged to start a food diary, count calories and to consider  joining a support group. Sample diet sheets offered. Goals set by the patient for the next several months.   Weight /BMI 09/05/2017 09/05/2017 06/01/2017  WEIGHT 326 lb 0.6 oz 326 lb 335 lb  HEIGHT 5\' 7"  5\' 7"  5\' 7"   BMI 51.07 kg/m2 51.06 kg/m2 52.47 kg/m2

## 2017-09-05 NOTE — Patient Instructions (Addendum)
F/u in 4 months, call I f you need me before  Please schedule mammogram at checkout due December 1 or after CoNGRATS on weight loss, keep it up!   Labs today, CBC,lipid, cmp and eGFR and tSH  Cologuard to be set up today  It is important that you exercise regularly at least 30 minutes 5 times a week. If you develop chest pain, have severe difficulty breathing, or feel very tired, stop exercising immediately and seek medical attention  Thank you  for choosing [facility]. We consider it a privelige to serve you.  Delivering excellent health care in a caring and  compassionate way is our goal.  Partnering with you,  so that together we can achieve this goal is our strategy.

## 2017-09-05 NOTE — Assessment & Plan Note (Signed)

## 2017-09-06 ENCOUNTER — Encounter: Payer: Self-pay | Admitting: Family Medicine

## 2017-09-06 NOTE — Assessment & Plan Note (Signed)
Improved, 9 pound weight loss since last visit, she o is applauded on this an will continue half phentermine daily. Patient re-educated about  the importance of commitment to a  minimum of 150 minutes of exercise per week.  The importance of healthy food choices with portion control discussed. Encouraged to start a food diary, count calories and to consider  joining a support group. Sample diet sheets offered. Goals set by the patient for the next several months.   Weight /BMI 09/05/2017 09/05/2017 06/01/2017  WEIGHT 326 lb 0.6 oz 326 lb 335 lb  HEIGHT 5\' 7"  5\' 7"  5\' 7"   BMI 51.07 kg/m2 51.06 kg/m2 52.47 kg/m2

## 2017-09-13 ENCOUNTER — Other Ambulatory Visit: Payer: Self-pay

## 2017-09-13 MED ORDER — POTASSIUM CHLORIDE CRYS ER 20 MEQ PO TBCR: 20.0000 meq | EXTENDED_RELEASE_TABLET | Freq: Every day | ORAL | 3 refills | Status: DC

## 2017-09-16 ENCOUNTER — Other Ambulatory Visit: Payer: Self-pay | Admitting: Family Medicine

## 2017-09-21 ENCOUNTER — Other Ambulatory Visit: Payer: Self-pay

## 2017-09-21 ENCOUNTER — Telehealth: Payer: Self-pay | Admitting: Family Medicine

## 2017-09-21 MED ORDER — POTASSIUM CHLORIDE CRYS ER 20 MEQ PO TBCR: 20.0000 meq | EXTENDED_RELEASE_TABLET | Freq: Every day | ORAL | 3 refills | Status: DC

## 2017-09-21 NOTE — Telephone Encounter (Signed)
potassium chloride SA (KLOR-CON M20) 20 MEQ tablet Please send into [facility] --they say they have never received it

## 2017-09-21 NOTE — Telephone Encounter (Signed)
Med sent on 7/30. Resent again today

## 2017-10-05 LAB — COLOGUARD: COLOGUARD: NEGATIVE

## 2018-01-10 ENCOUNTER — Ambulatory Visit: Payer: Medicare FFS | Admitting: Family Medicine

## 2018-01-16 ENCOUNTER — Other Ambulatory Visit: Payer: Self-pay | Admitting: Family Medicine

## 2018-01-16 ENCOUNTER — Ambulatory Visit (HOSPITAL_COMMUNITY)
Admission: RE | Admit: 2018-01-16 | Discharge: 2018-01-16 | Disposition: A | Discharge status: Home / Self Care | Payer: Medicare FFS | Source: Ambulatory Visit | Attending: Family Medicine | Admitting: Family Medicine

## 2018-01-16 DIAGNOSIS — R928 Other abnormal and inconclusive findings on diagnostic imaging of breast: Secondary | ICD-10-CM

## 2018-01-16 DIAGNOSIS — Z1231 Encounter for screening mammogram for malignant neoplasm of breast: Secondary | ICD-10-CM | POA: Insufficient documentation

## 2018-01-24 ENCOUNTER — Ambulatory Visit (HOSPITAL_COMMUNITY): Admission: RE | Admit: 2018-01-24 | Payer: Medicare FFS | Source: Ambulatory Visit

## 2018-01-24 ENCOUNTER — Ambulatory Visit (HOSPITAL_COMMUNITY)
Admission: RE | Admit: 2018-01-24 | Discharge: 2018-01-24 | Disposition: A | Discharge status: Home / Self Care | Payer: Medicare FFS | Source: Ambulatory Visit | Attending: Family Medicine | Admitting: Family Medicine

## 2018-01-24 DIAGNOSIS — R928 Other abnormal and inconclusive findings on diagnostic imaging of breast: Secondary | ICD-10-CM

## 2018-02-16 ENCOUNTER — Ambulatory Visit (INDEPENDENT_AMBULATORY_CARE_PROVIDER_SITE_OTHER): Payer: Medicare FFS | Admitting: Family Medicine

## 2018-02-16 ENCOUNTER — Encounter: Payer: Self-pay | Admitting: Family Medicine

## 2018-02-16 VITALS — BP 120/82 | HR 92 | Resp 12 | Ht 66.0 in | Wt 317.1 lb

## 2018-02-16 DIAGNOSIS — R7303 Prediabetes: Secondary | ICD-10-CM

## 2018-02-16 DIAGNOSIS — R7301 Impaired fasting glucose: Secondary | ICD-10-CM

## 2018-02-16 DIAGNOSIS — E8881 Metabolic syndrome: Secondary | ICD-10-CM

## 2018-02-16 DIAGNOSIS — I10 Essential (primary) hypertension: Secondary | ICD-10-CM

## 2018-02-16 DIAGNOSIS — J3089 Other allergic rhinitis: Secondary | ICD-10-CM

## 2018-02-16 MED ORDER — AZELASTINE HCL 0.1 % NA SOLN: 2.0000 | Freq: Two times a day (BID) | NASAL | 3 refills | Status: DC

## 2018-02-16 MED ORDER — PREDNISONE 5 MG PO TABS: 5.0000 mg | ORAL_TABLET | Freq: Two times a day (BID) | ORAL | 0 refills | Status: AC

## 2018-02-16 MED ORDER — PHENTERMINE HCL 37.5 MG PO TABS: 37.5000 mg | ORAL_TABLET | Freq: Every day | ORAL | 1 refills | Status: DC

## 2018-02-16 NOTE — Patient Instructions (Addendum)
F/U in 4 month s, call if you need me before  BP is good and excellent weight loss, keep it up  New additional for allergies is astellin and short course of prednisone  Labs today are HBA1C, chem 7 and EGFR non fasting  Continue half phentermine daily and start regualr exercise  WATER is GOOD for you  Thank you  for choosing [facility]. We consider it a privelige to serve you.  Delivering excellent health care in a caring and  compassionate way is our goal.  Partnering with you,  so that together we can achieve this goal is our strategy.

## 2018-02-17 ENCOUNTER — Encounter: Payer: Self-pay | Admitting: Family Medicine

## 2018-02-17 LAB — BASIC METABOLIC PANEL WITH GFR
BUN: 11 mg/dL (ref 7–25)
CO2: 30 mmol/L (ref 20–32)
Calcium: 10 mg/dL (ref 8.6–10.4)
Chloride: 102 mmol/L (ref 98–110)
Creat: 0.89 mg/dL (ref 0.50–0.99)
GFR, Est African American: 77 mL/min/{1.73_m2} (ref >=60)
GFR, Est Non African American: 66 mL/min/{1.73_m2} (ref >=60)
Glucose, Bld: 80 mg/dL (ref 65–139)
Potassium: 4 mmol/L (ref 3.5–5.3)
Sodium: 141 mmol/L (ref 135–146)

## 2018-02-17 LAB — HEMOGLOBIN A1C
Hgb A1c MFr Bld: 5.2 % of total Hgb (ref <5.7)
Mean Plasma Glucose: 103 (calc)
eAG (mmol/L): 5.7 (calc)

## 2018-02-17 NOTE — Assessment & Plan Note (Signed)
Controlled, no change in medication DASH diet and commitment to daily physical activity for a minimum of 30 minutes discussed and encouraged, as a part of hypertension management. The importance of attaining a healthy weight is also discussed.  BP/Weight 02/16/2018 09/05/2017 09/05/2017 06/01/2017 01/03/2017 09/01/2016 0/41/3643  Systolic BP 837 793 968 864 847 207 218  Diastolic BP 82 76 82 80 80 82 84  Wt. (Lbs) 317.12 326.04 326 335 338 352.25 356.4  BMI 51.18 51.07 51.06 52.47 52.94 55.17 55.82

## 2018-02-17 NOTE — Assessment & Plan Note (Signed)
Improved, she is applauded on this and will continue half phenterines daily. Patient re-educated about  the importance of commitment to a  minimum of 150 minutes of exercise per week.  The importance of healthy food choices with portion control discussed. Encouraged to start a food diary, count calories and to consider  joining a support group. Sample diet sheets offered. Goals set by the patient for the next several months.   Weight /BMI 02/16/2018 09/05/2017 09/05/2017  WEIGHT 317 lb 1.9 oz 326 lb 0.6 oz 326 lb  HEIGHT 5\' 6"  5\' 7"  5\' 7"   BMI 51.18 kg/m2 51.07 kg/m2 51.06 kg/m2

## 2018-02-17 NOTE — Assessment & Plan Note (Signed)
Patient educated about the importance of limiting  Carbohydrate intake , the need to commit to daily physical activity for a minimum of 30 minutes , and to commit weight loss. The fact that changes in all these areas will reduce or eliminate all together the development of diabetes is stressed.  Improved/ resolved because of sietary change and weight loss , which is excellent  Diabetic Labs Latest Ref Rng & Units 02/16/2018 09/05/2017 09/01/2016 08/21/2015 11/28/2014  HbA1c <5.7 % of total Hgb 5.2 - 5.6 5.4 5.7(H)  Chol <200 mg/dL - 163 169 - 143  HDL >50 mg/dL - 41(L) 39(L) - 37(L)  Calc LDL mg/dL (calc) - 99 105(H) - 84  Triglycerides <150 mg/dL - 124 123 - 111  Creatinine 0.50 - 0.99 mg/dL 0.89 0.91 0.81 0.74 0.78   BP/Weight 02/16/2018 09/05/2017 09/05/2017 06/01/2017 01/03/2017 09/01/2016 03/01/7260  Systolic BP 035 597 416 384 536 468 032  Diastolic BP 82 76 82 80 80 82 84  Wt. (Lbs) 317.12 326.04 326 335 338 352.25 356.4  BMI 51.18 51.07 51.06 52.47 52.94 55.17 55.82   No flowsheet data found.

## 2018-02-17 NOTE — Assessment & Plan Note (Signed)
Currently uncontrolled ,5 day course of prednisone aand Astelin aadded

## 2018-02-17 NOTE — Progress Notes (Signed)
Michele Lowe     MRN: [id number]      DOB: 12-07-1950   HPI Michele Lowe is here for follow up and re-evaluation of chronic medical conditions, medication management and review of any available recent lab and radiology data.  Preventive health is updated, specifically  Cancer screening and Immunization.   Questions or concerns regarding consultations or procedures which the PT has had in the interim are  addressed. The PT denies any adverse reactions to current medications since the last visit.  5  Day h/o increased and uncontrolled allergy symptoms, clear runny nose , denies fever , chills or malaise, despite regular use of medication Has continued to work diligently on change in diet a with good weight loss, states exercise is somewhat limited by right knee pain currently , but intends to start water aerobics asap  ROS Denies recent fever or chills. Denies , ear pain or sore throat. Denies chest congestion, productive cough or wheezing. Denies chest pains, palpitations and leg swelling Denies abdominal pain, nausea, vomiting,diarrhea or constipation.   Denies dysuria, frequency, hesitancy or incontinence.  Denies headaches, seizures, numbness, or tingling. Denies depression, anxiety or insomnia. Denies skin break down or rash.   PE  BP 120/82   Pulse 92   Resp 12   Ht 5\' 6"  (1.676 m)   Wt (!) 317 lb 1.9 oz (143.8 kg)   SpO2 100% Comment: room air  BMI 51.18 kg/m   Patient alert and oriented and in no cardiopulmonary distress.  HEENT: No facial asymmetry, EOMI,   oropharynx pink and moist.  Neck supple no JVD, no mass.  Chest: Clear to auscultation bilaterally.  CVS: S1, S2 no murmurs, no S3.Regular rate.  ABD: Soft non tender.   Ext: No edema  MS: Adequate ROM spine, shoulders, hips and markedly reduced in  Knees.right greater than left  Skin: Intact, no ulcerations  noted.  Psych: Good eye contact, normal affect. Memory intact not anxious or depressed  appearing.  CNS: CN 2-12 intact, power,  normal throughout.no focal deficits noted.   Assessment & Plan  Essential hypertension Controlled, no change in medication DASH diet and commitment to daily physical activity for a minimum of 30 minutes discussed and encouraged, as a part of hypertension management. The importance of attaining a healthy weight is also discussed.  BP/Weight 02/16/2018 09/05/2017 09/05/2017 06/01/2017 01/03/2017 09/01/2016 08/05/3084  Systolic BP 578 469 629 528 413 244 010  Diastolic BP 82 76 82 80 80 82 84  Wt. (Lbs) 317.12 326.04 326 335 338 352.25 356.4  BMI 51.18 51.07 51.06 52.47 52.94 55.17 55.82       MORBID OBESITY Improved, she is applauded on this and will continue half phenterines daily. Patient re-educated about  the importance of commitment to a  minimum of 150 minutes of exercise per week.  The importance of healthy food choices with portion control discussed. Encouraged to start a food diary, count calories and to consider  joining a support group. Sample diet sheets offered. Goals set by the patient for the next several months.   Weight /BMI 02/16/2018 09/05/2017 09/05/2017  WEIGHT 317 lb 1.9 oz 326 lb 0.6 oz 326 lb  HEIGHT 5\' 6"  5\' 7"  5\' 7"   BMI 51.18 kg/m2 51.07 kg/m2 51.06 kg/m2      Prediabetes Patient educated about the importance of limiting  Carbohydrate intake , the need to commit to daily physical activity for a minimum of 30 minutes , and to commit weight loss.  The fact that changes in all these areas will reduce or eliminate all together the development of diabetes is stressed.  Improved/ resolved because of sietary change and weight loss , which is excellent  Diabetic Labs Latest Ref Rng & Units 02/16/2018 09/05/2017 09/01/2016 08/21/2015 11/28/2014  HbA1c <5.7 % of total Hgb 5.2 - 5.6 5.4 5.7(H)  Chol <200 mg/dL - 163 169 - 143  HDL >50 mg/dL - 41(L) 39(L) - 37(L)  Calc LDL mg/dL (calc) - 99 105(H) - 84  Triglycerides <150 mg/dL - 124  123 - 111  Creatinine 0.50 - 0.99 mg/dL 0.89 0.91 0.81 0.74 0.78   BP/Weight 02/16/2018 09/05/2017 09/05/2017 06/01/2017 01/03/2017 09/01/2016 05/26/3011  Systolic BP 143 888 757 972 820 601 561  Diastolic BP 82 76 82 80 80 82 84  Wt. (Lbs) 317.12 326.04 326 335 338 352.25 356.4  BMI 51.18 51.07 51.06 52.47 52.94 55.17 55.82   No flowsheet data found.    Allergic rhinitis Currently uncontrolled ,5 day course of prednisone aand Astelin aadded

## 2018-02-20 ENCOUNTER — Encounter: Payer: Self-pay | Admitting: Family Medicine

## 2018-03-14 ENCOUNTER — Telehealth: Payer: Self-pay | Admitting: Family Medicine

## 2018-03-14 NOTE — Telephone Encounter (Signed)
Advise Iron rich foods? Please advise

## 2018-03-14 NOTE — Telephone Encounter (Signed)
Pt LVM that she went to give blood, and they would not take it cause her Hemoglobin was Low. PT would like to know what she needs to do to raise her hemoglobin.

## 2018-03-15 ENCOUNTER — Telehealth: Payer: Self-pay

## 2018-03-15 DIAGNOSIS — I10 Essential (primary) hypertension: Secondary | ICD-10-CM

## 2018-03-15 DIAGNOSIS — R710 Precipitous drop in hematocrit: Secondary | ICD-10-CM

## 2018-03-15 NOTE — Telephone Encounter (Signed)
pls advise her in July she had a normal blood count , so advise and order CBC, iron, ferritin ,  B12 levels

## 2018-03-15 NOTE — Telephone Encounter (Signed)
Labs ordered.

## 2018-03-15 NOTE — Telephone Encounter (Signed)
Labs ordered, mailed to patient, called and left her a message

## 2018-03-21 ENCOUNTER — Other Ambulatory Visit: Payer: Self-pay | Admitting: Family Medicine

## 2018-03-21 DIAGNOSIS — R710 Precipitous drop in hematocrit: Secondary | ICD-10-CM | POA: Diagnosis not present

## 2018-03-21 DIAGNOSIS — I10 Essential (primary) hypertension: Secondary | ICD-10-CM | POA: Diagnosis not present

## 2018-03-21 DIAGNOSIS — D539 Nutritional anemia, unspecified: Secondary | ICD-10-CM | POA: Diagnosis not present

## 2018-03-22 LAB — CBC/DIFF AMBIGUOUS DEFAULT
Basophils Absolute: 0 10*3/uL (ref 0.0–0.2)
Basos: 0 %
EOS (ABSOLUTE): 0.1 10*3/uL (ref 0.0–0.4)
Eos: 2 %
Hematocrit: 36.9 % (ref 34.0–46.6)
Hemoglobin: 12.1 g/dL (ref 11.1–15.9)
Immature Grans (Abs): 0 10*3/uL (ref 0.0–0.1)
Immature Granulocytes: 0 %
Lymphocytes Absolute: 2 10*3/uL (ref 0.7–3.1)
Lymphs: 33 %
MCH: 28.1 pg (ref 26.6–33.0)
MCHC: 32.8 g/dL (ref 31.5–35.7)
MCV: 86 fL (ref 79–97)
MONOS ABS: 0.5 10*3/uL (ref 0.1–0.9)
Monocytes: 8 %
Neutrophils Absolute: 3.4 10*3/uL (ref 1.4–7.0)
Neutrophils: 57 %
Platelets: 288 10*3/uL (ref 150–450)
RBC: 4.31 x10E6/uL (ref 3.77–5.28)
RDW: 13.3 % (ref 11.7–15.4)
WBC: 6 10*3/uL (ref 3.4–10.8)

## 2018-03-22 LAB — VITAMIN B12: Vitamin B-12: 477 pg/mL (ref 232–1245)

## 2018-03-22 LAB — FERRITIN: FERRITIN: 102 ng/mL (ref 15–150)

## 2018-03-22 LAB — IRON: Iron: 42 ug/dL (ref 27–139)

## 2018-04-12 ENCOUNTER — Other Ambulatory Visit: Payer: Self-pay | Admitting: Family Medicine

## 2018-06-28 ENCOUNTER — Ambulatory Visit (INDEPENDENT_AMBULATORY_CARE_PROVIDER_SITE_OTHER): Payer: Medicare PPO | Admitting: Family Medicine

## 2018-06-28 ENCOUNTER — Ambulatory Visit: Payer: Medicare FFS | Admitting: Family Medicine

## 2018-06-28 ENCOUNTER — Encounter: Payer: Self-pay | Admitting: Family Medicine

## 2018-06-28 VITALS — BP 120/82 | Ht 66.0 in | Wt 317.0 lb

## 2018-06-28 DIAGNOSIS — M25561 Pain in right knee: Secondary | ICD-10-CM

## 2018-06-28 DIAGNOSIS — E559 Vitamin D deficiency, unspecified: Secondary | ICD-10-CM | POA: Diagnosis not present

## 2018-06-28 DIAGNOSIS — I10 Essential (primary) hypertension: Secondary | ICD-10-CM

## 2018-06-28 DIAGNOSIS — J3089 Other allergic rhinitis: Secondary | ICD-10-CM

## 2018-06-28 DIAGNOSIS — Z7189 Other specified counseling: Secondary | ICD-10-CM

## 2018-06-28 DIAGNOSIS — E8881 Metabolic syndrome: Secondary | ICD-10-CM | POA: Diagnosis not present

## 2018-06-28 MED ORDER — PHENTERMINE HCL 37.5 MG PO TABS: 37.5000 mg | ORAL_TABLET | Freq: Every day | ORAL | 1 refills | Status: DC

## 2018-06-28 NOTE — Patient Instructions (Addendum)
Wellness due  July 23 or after  please schedule   Annual physical exam with MD mid September, please schedule, call if  You need me before  Please get fasting lipid, cmp and EGFR and TSH and vit D one week before your September appointment  Please start using allergy nasal spray and taking loratidine EVERY day , this will likely improve your headaches, which I believe are allergy based   Continue HALF phentermine once daily and work on food choice ,so that you continue to lose weight and therefore become more healthy  It is important that you exercise regularly at least 30 minutes 5 times a week. If you develop chest pain, have severe difficulty breathing, or feel very tired, stop exercising immediately and seek medical attention  Think about what you will eat, plan ahead. Choose " clean, green, fresh or frozen" over canned, processed or packaged foods which are more sugary, salty and fatty. 70 to 75% of food eaten should be vegetables and fruit. Three meals at set times with snacks allowed between meals, but they must be fruit or vegetables. Aim to eat over a 12 hour period , example 7 am to 7 pm, and STOP after  your last meal of the day. Drink water,generally about 64 ounces per day, no other drink is as healthy. Fruit juice is best enjoyed in a healthy way, by EATING the fruit. Social distancing. Frequent hand washing with soap and water Keeping your hands off of your face, wear a face mask  When away from home and  Maintain at least 6 ft distance from people you do not live with These 3 practices will help to keep both you and your community healthy during this time. Please practice them faithfully! Thanks for choosing [facility], we consider it a privelige to serve you.

## 2018-06-28 NOTE — Progress Notes (Signed)
Virtual Visit via Telephone Note  I connected with Michele Lowe on 06/28/18 at  2:20 PM EDT by telephone and verified that I am speaking with the correct person using two identifiers.  Location: Patient: home Provider: office  I connected with  Michele Lowe on 07/01/18 by a video enabled telemedicine application and verified that I am speaking with the correct person using two identifiers.   I discussed the limitations of evaluation and management by telemedicine. The patient expressed understanding and agreed to proceed.     I discussed the limitations, risks, security and privacy concerns of performing an evaluation and management service by telephone and the availability of in person appointments. I also discussed with the patient that there may be a patient responsible charge related to this service. The patient expressed understanding and agreed to proceed.   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 Denies ear pain or sore throat. Denies chest congestion, productive cough or wheezing. Denies chest pains, palpitations and leg swelling Denies abdominal pain, nausea, vomiting,diarrhea or constipation.   Denies dysuria, frequency, hesitancy or incontinence. Not exercising as much as she needs to but watching her diet, misses swimming at the [facility] Denies joint pain, swelling and limitation in mobility.  Denies depression, anxiety or insomnia. Denies skin break down or rash.      Observations/Objective: BP 120/82   Ht 5\' 6"  (1.676 m)   Wt (!) 317 lb (143.8 kg)   SpO2 100%   BMI 51.17 kg/m  Good communication with no confusion and intact memory. Alert and oriented x 3 No signs of respiratory distress during sppech    Assessment and Plan:  Essential hypertension Controlled, no change in  medication DASH diet and commitment to daily physical activity for a minimum of 30 minutes discussed and encouraged, as a part of hypertension management. The importance of attaining a healthy weight is also discussed.  BP/Weight 06/28/2018 02/16/2018 09/05/2017 09/05/2017 06/01/2017 01/03/2017 3/32/9518  Systolic BP 841 660 630 160 109 323 557  Diastolic BP 82 82 76 82 80 80 82  Wt. (Lbs) 317 317.12 326.04 326 335 338 352.25  BMI 51.17 51.18 51.07 51.06 52.47 52.94 55.17       Allergic rhinitis Increased and uncontrolled symptoms withm ild headaches, start daily medication and saline flushes  MORBID OBESITY Patient re-educated about  the importance of commitment to a  minimum of 150 minutes of exercise per week as able.  The importance of healthy food choices with portion control discussed, as well as eating regularly and within a 12 hour window most days. The need to choose "clean , green" food 50 to 75% of the time is discussed, as well as to make water the primary drink and set a goal of 64 ounces water daily. Continue half phentermine as before, anticipate 6 to 8 pound weight loss  Weight /BMI 06/28/2018 02/16/2018 09/05/2017  WEIGHT 317 lb 317 lb 1.9 oz 326 lb 0.6 oz  HEIGHT 5\' 6"  5\' 6"  5\' 7"   BMI 51.17 kg/m2 51.18 kg/m2 51.07 kg/m2      Educated About Covid-19 Virus Infection Covid-19 Education  The signs and symptoms of of COVID -19 were discussed with the patient and how to seek care for testing. ( follow up with PCP or arrange  E-visit) The importance of social  distancing is discussed today.  KNEE PAIN, RIGHT Chronic stiffness , no acute debility recently, no falls    Follow Up Instructions:    I discussed the assessment and treatment plan with the patient. The patient was provided an opportunity to ask questions and all were answered. The patient agreed with the plan and demonstrated an understanding of the instructions.   The patient was advised to call back or  seek an in-person evaluation if the symptoms worsen or if the condition fails to improve as anticipated.  I provided 25 minutes of non-face-to-face time during this encounter.   Tula Nakayama, MD

## 2018-06-29 ENCOUNTER — Other Ambulatory Visit: Payer: Self-pay | Admitting: Family Medicine

## 2018-07-01 ENCOUNTER — Encounter: Payer: Self-pay | Admitting: Family Medicine

## 2018-07-01 DIAGNOSIS — Z7189 Other specified counseling: Secondary | ICD-10-CM

## 2018-07-01 HISTORY — DX: Other specified counseling: Z71.89

## 2018-07-01 NOTE — Assessment & Plan Note (Signed)
Chronic stiffness , no acute debility recently, no falls

## 2018-07-01 NOTE — Assessment & Plan Note (Signed)
Controlled, no change in medication DASH diet and commitment to daily physical activity for a minimum of 30 minutes discussed and encouraged, as a part of hypertension management. The importance of attaining a healthy weight is also discussed.  BP/Weight 06/28/2018 02/16/2018 09/05/2017 09/05/2017 06/01/2017 01/03/2017 10/14/5619  Systolic BP 308 657 846 962 952 841 324  Diastolic BP 82 82 76 82 80 80 82  Wt. (Lbs) 317 317.12 326.04 326 335 338 352.25  BMI 51.17 51.18 51.07 51.06 52.47 52.94 55.17

## 2018-07-01 NOTE — Assessment & Plan Note (Signed)
Increased and uncontrolled symptoms withm ild headaches, start daily medication and saline flushes

## 2018-07-01 NOTE — Assessment & Plan Note (Signed)
Covid-19 Education  The signs and symptoms of of COVID -19 were discussed with the patient and how to seek care for testing. ( follow up with PCP or arrange  E-visit) The importance of social  distancing is discussed today.  

## 2018-07-01 NOTE — Assessment & Plan Note (Addendum)
Patient re-educated about  the importance of commitment to a  minimum of 150 minutes of exercise per week as able.  The importance of healthy food choices with portion control discussed, as well as eating regularly and within a 12 hour window most days. The need to choose "clean , green" food 50 to 75% of the time is discussed, as well as to make water the primary drink and set a goal of 64 ounces water daily. Continue half phentermine as before, anticipate 6 to 8 pound weight loss  Weight /BMI 06/28/2018 02/16/2018 09/05/2017  WEIGHT 317 lb 317 lb 1.9 oz 326 lb 0.6 oz  HEIGHT 5\' 6"  5\' 6"  5\' 7"   BMI 51.17 kg/m2 51.18 kg/m2 51.07 kg/m2

## 2018-07-07 DIAGNOSIS — Z96652 Presence of left artificial knee joint: Secondary | ICD-10-CM | POA: Diagnosis not present

## 2018-07-07 DIAGNOSIS — M1711 Unilateral primary osteoarthritis, right knee: Secondary | ICD-10-CM | POA: Diagnosis not present

## 2018-07-07 DIAGNOSIS — Z471 Aftercare following joint replacement surgery: Secondary | ICD-10-CM | POA: Diagnosis not present

## 2018-07-07 DIAGNOSIS — M25562 Pain in left knee: Secondary | ICD-10-CM | POA: Diagnosis not present

## 2018-07-07 DIAGNOSIS — T8484XD Pain due to internal orthopedic prosthetic devices, implants and grafts, subsequent encounter: Secondary | ICD-10-CM | POA: Diagnosis not present

## 2018-09-01 ENCOUNTER — Other Ambulatory Visit: Payer: Self-pay | Admitting: Family Medicine

## 2018-09-05 ENCOUNTER — Telehealth: Payer: Self-pay | Admitting: Family Medicine

## 2018-09-05 NOTE — Telephone Encounter (Signed)
Pt LVM she needs someone to call regarding prescriptions

## 2018-09-06 NOTE — Telephone Encounter (Signed)
Spoke with patient. Medication was refilled yesterday.

## 2018-09-07 ENCOUNTER — Other Ambulatory Visit: Payer: Self-pay

## 2018-09-07 ENCOUNTER — Encounter: Payer: Self-pay | Admitting: Family Medicine

## 2018-09-07 ENCOUNTER — Ambulatory Visit (INDEPENDENT_AMBULATORY_CARE_PROVIDER_SITE_OTHER): Payer: Medicare PPO | Admitting: Family Medicine

## 2018-09-07 VITALS — BP 120/82 | HR 92 | Resp 12 | Ht 66.0 in | Wt 317.0 lb

## 2018-09-07 DIAGNOSIS — Z0000 Encounter for general adult medical examination without abnormal findings: Secondary | ICD-10-CM | POA: Diagnosis not present

## 2018-09-07 NOTE — Patient Instructions (Addendum)
[name] , Thank you for taking time to come for your Medicare Wellness Visit. I appreciate your ongoing commitment to your health goals. Please review the following plan we discussed and let me know if I can assist you in the future.   Please continue to practice social distancing to keep you, your family, and our community safe.  If you must go out, please wear a Mask and practice good handwashing.  Screening recommendations/referrals: Colonoscopy: Cologuard 2019-negative  Mammogram: Due 2021 Bone Density: Completed  Recommended yearly ophthalmology/optometry visit for glaucoma screening and checkup Recommended yearly dental visit for hygiene and checkup  Vaccinations: Influenza vaccine: Forgoing (allergy) Pneumococcal vaccine: Completed Tdap vaccine: Over due (check insurance coverage) Shingles vaccine: Completed 2010  Advanced directives: Daughter has copies  Conditions/risks identified: Falls   Next appointment: 11/06/2018    Preventive Care 24 Years and Older, Female Preventive care refers to lifestyle choices and visits with your health care provider that can promote health and wellness. What does preventive care include?  A yearly physical exam. This is also called an annual well check.  Dental exams once or twice a year.  Routine eye exams. Ask your health care provider how often you should have your eyes checked.  Personal lifestyle choices, including:  Daily care of your teeth and gums.  Regular physical activity.  Eating a healthy diet.  Avoiding tobacco and drug use.  Limiting alcohol use.  Practicing safe sex.  Taking low-dose aspirin every day.  Taking vitamin and mineral supplements as recommended by your health care provider. What happens during an annual well check? The services and screenings done by your health care provider during your annual well check will depend on your age, overall health, lifestyle risk factors, and family history of  disease. Counseling  Your health care provider may ask you questions about your:  Alcohol use.  Tobacco use.  Drug use.  Emotional well-being.  Home and relationship well-being.  Sexual activity.  Eating habits.  History of falls.  Memory and ability to understand (cognition).  Work and work Statistician.  Reproductive health. Screening  You may have the following tests or measurements:  Height, weight, and BMI.  Blood pressure.  Lipid and cholesterol levels. These may be checked every 5 years, or more frequently if you are over 64 years old.  Skin check.  Lung cancer screening. You may have this screening every year starting at age 32 if you have a 30-pack-year history of smoking and currently smoke or have quit within the past 15 years.  Fecal occult blood test (FOBT) of the stool. You may have this test every year starting at age 36.  Flexible sigmoidoscopy or colonoscopy. You may have a sigmoidoscopy every 5 years or a colonoscopy every 10 years starting at age 84.  Hepatitis C blood test.  Hepatitis B blood test.  Sexually transmitted disease (STD) testing.  Diabetes screening. This is done by checking your blood sugar (glucose) after you have not eaten for a while (fasting). You may have this done every 1-3 years.  Bone density scan. This is done to screen for osteoporosis. You may have this done starting at age 46.  Mammogram. This may be done every 1-2 years. Talk to your health care provider about how often you should have regular mammograms. Talk with your health care provider about your test results, treatment options, and if necessary, the need for more tests. Vaccines  Your health care provider may recommend certain vaccines, such as:  Influenza vaccine. This is recommended every year.  Tetanus, diphtheria, and acellular pertussis (Tdap, Td) vaccine. You may need a Td booster every 10 years.  Zoster vaccine. You may need this after age 51.   Pneumococcal 13-valent conjugate (PCV13) vaccine. One dose is recommended after age 40.  Pneumococcal polysaccharide (PPSV23) vaccine. One dose is recommended after age 35. Talk to your health care provider about which screenings and vaccines you need and how often you need them. This information is not intended to replace advice given to you by your health care provider. Make sure you discuss any questions you have with your health care provider. Document Released: 02/28/2015 Document Revised: 10/22/2015 Document Reviewed: 12/03/2014 Elsevier Interactive Patient Education  2017 [place] Prevention in the Home Falls can cause injuries. They can happen to people of all ages. There are many things you can do to make your home safe and to help prevent falls. What can I do on the outside of my home?  Regularly fix the edges of walkways and driveways and fix any cracks.  Remove anything that might make you trip as you walk through a door, such as a raised step or threshold.  Trim any bushes or trees on the path to your home.  Use bright outdoor lighting.  Clear any walking paths of anything that might make someone trip, such as rocks or tools.  Regularly check to see if handrails are loose or broken. Make sure that both sides of any steps have handrails.  Any raised decks and porches should have guardrails on the edges.  Have any leaves, snow, or ice cleared regularly.  Use sand or salt on walking paths during winter.  Clean up any spills in your garage right away. This includes oil or grease spills. What can I do in the bathroom?  Use night lights.  Install grab bars by the toilet and in the tub and shower. Do not use towel bars as grab bars.  Use non-skid mats or decals in the tub or shower.  If you need to sit down in the shower, use a plastic, non-slip stool.  Keep the floor dry. Clean up any water that spills on the floor as soon as it happens.  Remove soap  buildup in the tub or shower regularly.  Attach bath mats securely with double-sided non-slip rug tape.  Do not have throw rugs and other things on the floor that can make you trip. What can I do in the bedroom?  Use night lights.  Make sure that you have a light by your bed that is easy to reach.  Do not use any sheets or blankets that are too big for your bed. They should not hang down onto the floor.  Have a firm chair that has side arms. You can use this for support while you get dressed.  Do not have throw rugs and other things on the floor that can make you trip. What can I do in the kitchen?  Clean up any spills right away.  Avoid walking on wet floors.  Keep items that you use a lot in easy-to-reach places.  If you need to reach something above you, use a strong step stool that has a grab bar.  Keep electrical cords out of the way.  Do not use floor polish or wax that makes floors slippery. If you must use wax, use non-skid floor wax.  Do not have throw rugs and other things on the floor that  can make you trip. What can I do with my stairs?  Do not leave any items on the stairs.  Make sure that there are handrails on both sides of the stairs and use them. Fix handrails that are broken or loose. Make sure that handrails are as long as the stairways.  Check any carpeting to make sure that it is firmly attached to the stairs. Fix any carpet that is loose or worn.  Avoid having throw rugs at the top or bottom of the stairs. If you do have throw rugs, attach them to the floor with carpet tape.  Make sure that you have a light switch at the top of the stairs and the bottom of the stairs. If you do not have them, ask someone to add them for you. What else can I do to help prevent falls?  Wear shoes that:  Do not have high heels.  Have rubber bottoms.  Are comfortable and fit you well.  Are closed at the toe. Do not wear sandals.  If you use a stepladder:  Make  sure that it is fully opened. Do not climb a closed stepladder.  Make sure that both sides of the stepladder are locked into place.  Ask someone to hold it for you, if possible.  Clearly mark and make sure that you can see:  Any grab bars or handrails.  First and last steps.  Where the edge of each step is.  Use tools that help you move around (mobility aids) if they are needed. These include:  Canes.  Walkers.  Scooters.  Crutches.  Turn on the lights when you go into a dark area. Replace any light bulbs as soon as they burn out.  Set up your furniture so you have a clear path. Avoid moving your furniture around.  If any of your floors are uneven, fix them.  If there are any pets around you, be aware of where they are.  Review your medicines with your doctor. Some medicines can make you feel dizzy. This can increase your chance of falling. Ask your doctor what other things that you can do to help prevent falls. This information is not intended to replace advice given to you by your health care provider. Make sure you discuss any questions you have with your health care provider. Document Released: 11/28/2008 Document Revised: 07/10/2015 Document Reviewed: 03/08/2014 Elsevier Interactive Patient Education  2017 [employer].

## 2018-09-07 NOTE — Progress Notes (Signed)
Subjective:   Michele Lowe is a 67 y.o. female who presents for Medicare Annual (Subsequent) preventive examination.  Location of Patient: Home Location of Provider: Telehealth Consent was obtain for visit to be over via telehealth. I verified that I am speaking with the correct person using two identifiers.   Review of Systems:    Cardiac Risk Factors include: advanced age (>71men, >31 women);hypertension;obesity (BMI >30kg/m2)     Objective:     Vitals: BP 120/82   Pulse 92   Resp 12   Ht 5\' 6"  (1.676 m)   Wt (!) 317 lb (143.8 kg)   BMI 51.17 kg/m   Body mass index is 51.17 kg/m.  Advanced Directives 09/05/2017 09/01/2016  Does Patient Have a Medical Advance Directive? No No  Would patient like information on creating a medical advance directive? Yes (ED - Information included in AVS) No - Patient declined    Tobacco Social History   Tobacco Use  Smoking Status Never Smoker  Smokeless Tobacco Never Used     Counseling given: Yes   Clinical Intake:  Pre-visit preparation completed: Yes  Pain Score: 5      BMI - recorded: 51.17 Nutritional Status: BMI > 30  Obese Nutritional Risks: None Diabetes: No  How often do you need to have someone help you when you read instructions, pamphlets, or other written materials from your doctor or pharmacy?: 1 - Never What is the last grade level you completed in school?: 12  Interpreter Needed?: No     Past Medical History:  Diagnosis Date  . Allergy   . Arthritis   . Cancer ([place]) 06/20/2014   MELANOMA, LEFT  LOWER ARM  . Essential hypertension, benign 2009  . Morbid obesity ([place])   . Rhinosinusitis    Past Surgical History:  Procedure Laterality Date  . ABDOMINAL HYSTERECTOMY     Partial   . JOINT REPLACEMENT  05/13/2011   Revision of left knee replacement in 2008  . MOLE REMOVAL  06/20/2014   Dr Harriet Masson  . Partial hysterectomy ,secondary to fibroid . Pt still have both ovaries  1978  . Rt.  forearm surgery for nerve repair  1989  . TOTAL KNEE ARTHROPLASTY  2007   Right    Family History  Problem Relation Age of Onset  . Heart failure Mother   . Hypertension Mother   . Heart disease Mother        Irregular heart valve  . Hypertension Father   . Emphysema Father   . Heart disease Father   . COPD Father   . Diabetes Brother   . Heart failure Brother   . Diabetes Brother   . Diabetes Brother   . Hyperlipidemia Brother   . Hypertension Sister   . Hypertension Sister    Social History   Socioeconomic History  . Marital status: Divorced    Spouse name: Not on file  . Number of children: 3  . Years of education: Not on file  . Highest education level: Not on file  Occupational History  . Occupation: Disabled   Social Needs  . Financial resource strain: Not hard at all  . Food insecurity    Worry: Never true    Inability: Never true  . Transportation needs    Medical: No    Non-medical: No  Tobacco Use  . Smoking status: Never Smoker  . Smokeless tobacco: Never Used  Substance and Sexual Activity  . Alcohol use: No  .  Drug use: No  . Sexual activity: Not Currently  Lifestyle  . Physical activity    Days per week: 0 days    Minutes per session: 0 min  . Stress: Not at all  Relationships  . Social connections    Talks on phone: More than three times a week    Gets together: More than three times a week    Attends religious service: More than 4 times per year    Active member of club or organization: Yes    Attends meetings of clubs or organizations: More than 4 times per year    Relationship status: Divorced  Other Topics Concern  . Not on file  Social History Narrative  . Not on file    Outpatient Encounter Medications as of 09/07/2018  Medication Sig  . azelastine (ASTELIN) 0.1 % nasal spray Place 2 sprays into both nostrils 2 (two) times daily. Use in each nostril as directed  . calcium-vitamin D (OSCAL WITH D) 500-200 MG-UNIT per tablet Take  1 tablet by mouth 3 (three) times daily.    Marland Kitchen Cod Liver Oil CAPS Take by mouth daily.    . furosemide (LASIX) 40 MG tablet TAKE 1 TABLET EVERY DAY  . Glucosamine-Chondroit-Vit C-Mn (GLUCOSAMINE 1500 COMPLEX PO) Take 1 capsule by mouth daily.  Marland Kitchen ibuprofen (ADVIL,MOTRIN) 800 MG tablet One tablet twice daily , as needed , for acute knee pain  Do not take for more than 3 consecutive days  . loratadine (CLARITIN) 10 MG tablet Take 1 tablet (10 mg total) by mouth daily.  . Multiple Vitamin (MULTIVITAMIN PO) Take by mouth daily.    . phentermine (ADIPEX-P) 37.5 MG tablet Take 1 tablet (37.5 mg total) by mouth daily before breakfast.  . potassium chloride SA (KLOR-CON M20) 20 MEQ tablet Take 1 tablet (20 mEq total) by mouth daily.  Marland Kitchen triamterene-hydrochlorothiazide (MAXZIDE-25) 37.5-25 MG tablet TAKE 1 AND 1/2 TABLETS ONE TIME DAILY   No facility-administered encounter medications on file as of 09/07/2018.     Activities of Daily Living In your present state of health, do you have any difficulty performing the following activities: 09/07/2018  Hearing? N  Vision? N  Difficulty concentrating or making decisions? N  Walking or climbing stairs? Y  Dressing or bathing? N  Doing errands, shopping? N  Preparing Food and eating ? N  Using the Toilet? N  In the past six months, have you accidently leaked urine? N  Do you have problems with loss of bowel control? N  Managing your Medications? N  Managing your Finances? N  Housekeeping or managing your Housekeeping? N  Some recent data might be hidden    Patient Care Team: Fayrene Helper, MD as PCP - General Devonne Doughty, MD as Referring Physician (Orthopedic Surgery) Satira Sark, MD as Consulting Physician (Cardiology) Garrel Ridgel, DPM as Consulting Physician (Podiatry)    Assessment:   This is a routine wellness examination for Michele Lowe.  Exercise Activities and Dietary recommendations Current Exercise Habits: The patient does  not participate in regular exercise at present, Exercise limited by: orthopedic condition(s)  Goals   None     Fall Risk Fall Risk  09/07/2018 06/28/2018 02/16/2018 09/05/2017 06/01/2017  Falls in the past year? 0 0 0 No No  Number falls in past yr: - 0 0 - -  Injury with Fall? 0 0 0 - -   Is the patient's home free of loose throw rugs in walkways, pet beds, electrical  cords, etc?   yes      Grab bars in the bathroom? yes      Handrails on the stairs?   no      Adequate lighting?   yes     Depression Screen PHQ 2/9 Scores 09/07/2018 02/16/2018 09/05/2017 06/01/2017  PHQ - 2 Score 0 0 0 0  PHQ- 9 Score - - - -     Cognitive Function     6CIT Screen 09/07/2018 09/05/2017  What Year? - 0 points  What month? - 0 points  What time? 0 points 0 points  Count back from 20 0 points 0 points  Months in reverse 0 points 0 points  Repeat phrase 0 points 6 points  Total Score - 6    Immunization History  Administered Date(s) Administered  . Influenza Whole 11/23/2006  . Pneumococcal Conjugate-13 10/31/2013  . Pneumococcal Polysaccharide-23 08/04/2015  . Td 11/23/2006  . Zoster 02/13/2009    Qualifies for Shingles Vaccine? Completed 2010  Screening Tests Health Maintenance  Topic Date Due  . TETANUS/TDAP  11/22/2016  . COLONOSCOPY  09/07/2018 (Originally 02/23/2016)  . INFLUENZA VACCINE  09/16/2018  . MAMMOGRAM  01/17/2020  . DEXA SCAN  Completed  . Hepatitis C Screening  Completed  . PNA vac Low Risk Adult  Completed    Cancer Screenings: Lung: Low Dose CT Chest recommended if Age 67-80 years, 30 pack-year currently smoking OR have quit w/in 15years. Patient does not qualify. Breast:  Up to date on Mammogram? Yes   Up to date of Bone Density/Dexa? Yes Colorectal: Cologuard 09/2017 negative   Additional Screenings:  Hepatitis C Screening: Completed      Plan:      1. Encounter for Medicare annual wellness exam  I have personally reviewed and noted the following in the  patient's chart:   . Medical and social history . Use of alcohol, tobacco or illicit drugs  . Current medications and supplements . Functional ability and status . Nutritional status . Physical activity . Advanced directives . List of other physicians . Hospitalizations, surgeries, and ER visits in previous 12 months . Vitals . Screenings to include cognitive, depression, and falls . Referrals and appointments  In addition, I have reviewed and discussed with patient certain preventive protocols, quality metrics, and best practice recommendations. A written personalized care plan for preventive services as well as general preventive health recommendations were provided to patient.    I provided 20 minutes of non-face-to-face time during this encounter.   Perlie Mayo, NP  09/07/2018

## 2018-11-01 DIAGNOSIS — M1711 Unilateral primary osteoarthritis, right knee: Secondary | ICD-10-CM | POA: Diagnosis not present

## 2018-11-06 ENCOUNTER — Encounter: Payer: PRIVATE HEALTH INSURANCE | Admitting: Family Medicine

## 2018-11-23 DIAGNOSIS — E559 Vitamin D deficiency, unspecified: Secondary | ICD-10-CM | POA: Diagnosis not present

## 2018-11-23 DIAGNOSIS — E8881 Metabolic syndrome: Secondary | ICD-10-CM | POA: Diagnosis not present

## 2018-11-23 DIAGNOSIS — I10 Essential (primary) hypertension: Secondary | ICD-10-CM | POA: Diagnosis not present

## 2018-11-24 LAB — COMPLETE METABOLIC PANEL WITH GFR
AG Ratio: 1.3 (calc) (ref 1.0–2.5)
ALT: 13 U/L (ref 6–29)
AST: 15 U/L (ref 10–35)
Albumin: 4 g/dL (ref 3.6–5.1)
Alkaline phosphatase (APISO): 117 U/L (ref 37–153)
BUN: 17 mg/dL (ref 7–25)
CO2: 31 mmol/L (ref 20–32)
Calcium: 9.4 mg/dL (ref 8.6–10.4)
Chloride: 103 mmol/L (ref 98–110)
Creat: 0.93 mg/dL (ref 0.50–0.99)
GFR, Est African American: 73 mL/min/{1.73_m2} (ref >=60)
GFR, Est Non African American: 63 mL/min/{1.73_m2} (ref >=60)
Globulin: 3.1 g/dL (calc) (ref 1.9–3.7)
Glucose, Bld: 86 mg/dL (ref 65–99)
Potassium: 4.3 mmol/L (ref 3.5–5.3)
Sodium: 141 mmol/L (ref 135–146)
Total Bilirubin: 0.4 mg/dL (ref 0.2–1.2)
Total Protein: 7.1 g/dL (ref 6.1–8.1)

## 2018-11-24 LAB — LIPID PANEL
Cholesterol: 178 mg/dL (ref <200)
HDL: 52 mg/dL (ref >=50)
LDL Cholesterol (Calc): 106 mg/dL (calc) — ABNORMAL HIGH
Non-HDL Cholesterol (Calc): 126 mg/dL (calc) (ref <130)
Total CHOL/HDL Ratio: 3.4 (calc) (ref <5.0)
Triglycerides: 102 mg/dL (ref <150)

## 2018-11-24 LAB — VITAMIN D 25 HYDROXY (VIT D DEFICIENCY, FRACTURES): Vit D, 25-Hydroxy: 30 ng/mL (ref 30–100)

## 2018-11-24 LAB — TSH: TSH: 1.56 mIU/L (ref 0.40–4.50)

## 2018-11-27 ENCOUNTER — Ambulatory Visit (INDEPENDENT_AMBULATORY_CARE_PROVIDER_SITE_OTHER): Payer: Medicare PPO | Admitting: Family Medicine

## 2018-11-27 ENCOUNTER — Encounter: Payer: Self-pay | Admitting: Family Medicine

## 2018-11-27 ENCOUNTER — Encounter (INDEPENDENT_AMBULATORY_CARE_PROVIDER_SITE_OTHER): Payer: Self-pay

## 2018-11-27 ENCOUNTER — Other Ambulatory Visit: Payer: Self-pay

## 2018-11-27 VITALS — BP 139/84 | HR 83 | Resp 15 | Ht 67.0 in | Wt 323.0 lb

## 2018-11-27 DIAGNOSIS — Z0000 Encounter for general adult medical examination without abnormal findings: Secondary | ICD-10-CM

## 2018-11-27 DIAGNOSIS — Z23 Encounter for immunization: Secondary | ICD-10-CM | POA: Insufficient documentation

## 2018-11-27 DIAGNOSIS — Z1231 Encounter for screening mammogram for malignant neoplasm of breast: Secondary | ICD-10-CM | POA: Diagnosis not present

## 2018-11-27 HISTORY — DX: Encounter for immunization: Z23

## 2018-11-27 MED ORDER — POTASSIUM CHLORIDE CRYS ER 20 MEQ PO TBCR: 20.0000 meq | EXTENDED_RELEASE_TABLET | Freq: Every day | ORAL | 3 refills | Status: DC

## 2018-11-27 NOTE — Patient Instructions (Addendum)
F/U in 6 months with MD, call if you need me sooner  Flu vaccine today with tylenol ES one tablet  ( patient inadvertently was not given the vaccine at visit,she has been contacted, asking that she return for her vaccine) I recommend you taking tylenol on 2 to 3 times daily for the next 2 days  Please schedule mammogram at checkout for pt  Excellent labs  Continue to work on food choice , portion control, and more scheduled eating   Thankful overall feels well. Continue weight loss for the benefit on your knees

## 2018-11-27 NOTE — Assessment & Plan Note (Signed)
After obtaining informed consent, the vaccine is  administered , with no adverse effect noted at the time of administration.  

## 2018-11-27 NOTE — Progress Notes (Signed)
    Michele Lowe     MRN: [id number]      DOB: May 31, 1950  HPI: Patient is in for annual physical exam. No other health concerns are expressed or addressed at the visit. Recent labs, if available are reviewed. Immunization is reviewed , and  updated if needed.   PE: BP 139/84   Pulse 83   Resp 15   Ht 5\' 7"  (1.702 m)   Wt (!) 323 lb 0.6 oz (146.5 kg)   SpO2 96%   BMI 50.60 kg/m   Pleasant  female, alert and oriented x 3, in no cardio-pulmonary distress. Afebrile. HEENT No facial trauma or asymetry. Sinuses non tender.  Extra occullar muscles intact.. External ears normal, . Neck: supple, no adenopathy,JVD or thyromegaly.No bruits.  Chest: Clear to ascultation bilaterally.No crackles or wheezes. Non tender to palpation  Breast: Not examined has upcoming mammogram and does not report breast pain or mass  Cardiovascular system; Heart sounds normal,  S1 and  S2 ,no S3.  No murmur, or thrill. Apical beat not displaced Peripheral pulses normal.  Abdomen: Soft, non tender,    Musculoskeletal exam:Decreased  ROM of spine, hips , shoulders and knees.  deformity and crepitus noted.   Neurologic: Cranial nerves 2 to 12 intact. Power, normal throughout. disturbance in gait. No tremor.    Psych; Normal mood and affect. Judgement and concentration normal   Assessment & Plan:  Annual physical exam Annual exam as documented. Counseling done  re healthy lifestyle involving commitment to 150 minutes exercise per week, heart healthy diet, and attaining healthy weight.The importance of adequate sleep also discussed. Immunization and cancer screening needs are specifically addressed at this visit.   Need for influenza vaccination After obtaining informed consent, the vaccine is  administered , with no adverse effect noted at the time of administration.

## 2018-11-27 NOTE — Assessment & Plan Note (Signed)
Annual exam as documented. Counseling done  re healthy lifestyle involving commitment to 150 minutes exercise per week, heart healthy diet, and attaining healthy weight.The importance of adequate sleep also discussed.  Immunization and cancer screening needs are specifically addressed at this visit.  

## 2019-01-16 DIAGNOSIS — M1711 Unilateral primary osteoarthritis, right knee: Secondary | ICD-10-CM | POA: Diagnosis not present

## 2019-01-16 DIAGNOSIS — T8484XD Pain due to internal orthopedic prosthetic devices, implants and grafts, subsequent encounter: Secondary | ICD-10-CM | POA: Diagnosis not present

## 2019-01-16 DIAGNOSIS — M25562 Pain in left knee: Secondary | ICD-10-CM | POA: Diagnosis not present

## 2019-01-16 DIAGNOSIS — Z96652 Presence of left artificial knee joint: Secondary | ICD-10-CM | POA: Diagnosis not present

## 2019-01-16 DIAGNOSIS — Z471 Aftercare following joint replacement surgery: Secondary | ICD-10-CM | POA: Diagnosis not present

## 2019-01-26 ENCOUNTER — Ambulatory Visit (HOSPITAL_COMMUNITY)
Admission: RE | Admit: 2019-01-26 | Discharge: 2019-01-26 | Disposition: A | Discharge status: Home / Self Care | Payer: Medicare PPO | Source: Ambulatory Visit | Attending: Family Medicine | Admitting: Family Medicine

## 2019-01-26 ENCOUNTER — Other Ambulatory Visit: Payer: Self-pay

## 2019-01-26 DIAGNOSIS — Z1231 Encounter for screening mammogram for malignant neoplasm of breast: Secondary | ICD-10-CM | POA: Insufficient documentation

## 2019-02-20 ENCOUNTER — Other Ambulatory Visit: Payer: Self-pay

## 2019-02-20 MED ORDER — FUROSEMIDE 40 MG PO TABS: 40.0000 mg | ORAL_TABLET | Freq: Every day | ORAL | 1 refills | Status: DC

## 2019-02-21 ENCOUNTER — Telehealth: Payer: Self-pay

## 2019-02-21 NOTE — Telephone Encounter (Signed)
Please call in furosemide (LASIX) 40 MG tablet to the mail order    Would like to know about the Covid vaccine, and if she should take it

## 2019-02-21 NOTE — Telephone Encounter (Signed)
Patient aware and vaccine recommended

## 2019-04-09 ENCOUNTER — Other Ambulatory Visit: Payer: Self-pay | Admitting: Family Medicine

## 2019-05-29 ENCOUNTER — Ambulatory Visit: Payer: PRIVATE HEALTH INSURANCE | Admitting: Family Medicine

## 2019-06-19 ENCOUNTER — Telehealth (INDEPENDENT_AMBULATORY_CARE_PROVIDER_SITE_OTHER): Payer: Medicare PPO | Admitting: Family Medicine

## 2019-06-19 ENCOUNTER — Encounter: Payer: Self-pay | Admitting: Family Medicine

## 2019-06-19 ENCOUNTER — Other Ambulatory Visit: Payer: Self-pay

## 2019-06-19 DIAGNOSIS — K219 Gastro-esophageal reflux disease without esophagitis: Secondary | ICD-10-CM | POA: Diagnosis not present

## 2019-06-19 DIAGNOSIS — I10 Essential (primary) hypertension: Secondary | ICD-10-CM | POA: Diagnosis not present

## 2019-06-19 DIAGNOSIS — R7303 Prediabetes: Secondary | ICD-10-CM | POA: Diagnosis not present

## 2019-06-19 DIAGNOSIS — E7841 Elevated Lipoprotein(a): Secondary | ICD-10-CM

## 2019-06-19 DIAGNOSIS — J3089 Other allergic rhinitis: Secondary | ICD-10-CM | POA: Diagnosis not present

## 2019-06-19 NOTE — Progress Notes (Signed)
Virtual Visit via Telephone Note   This visit type was conducted due to national recommendations for restrictions regarding the COVID-19 Pandemic (e.g. social distancing) in an effort to limit this patient's exposure and mitigate transmission in our community.  Due to her co-morbid illnesses, this patient is at least at moderate risk for complications without adequate follow up.  This format is felt to be most appropriate for this patient at this time.  The patient did not have access to video technology/had technical difficulties with video requiring transitioning to audio format only (telephone).  All issues noted in this document were discussed and addressed.  No physical exam could be performed with this format.  Evaluation Performed:  Follow-up visit  Date:  06/19/2019   ID:  Michele Lowe, Michele Lowe 03/24/1950, MRN [id number]  Patient Location: Home Provider Location: Office  Location of Patient: Home Location of Provider: Telehealth Consent was obtain for visit to be over via telehealth. I verified that I am speaking with the correct person using two identifiers.  PCP:  Fayrene Helper, MD   Chief Complaint: 70-month follow-up chronic conditions  History of Present Illness:    Michele Lowe is a 69 y.o. female with history of arthritis, allergies, morbid obesity, hypertension among others.  Reports today for 69-month follow-up overall doing well has no complaints.  Reports that she is sleeping well, has not had any falls, eating well trying to eat a heart healthy diet, not exercising as much as she could but is working on it.  Denies having any changes in bowel or bladder habits.  Denies having any changes in memory.  The patient does not have symptoms concerning for COVID-19 infection (fever, chills, cough, or new shortness of breath).   Past Medical, Surgical, Social History, Allergies, and Medications have been Reviewed.  Past Medical History:  Diagnosis Date  .  Allergy   . Annual physical exam 08/04/2015  . Arthritis   . Cancer ([place]) 06/20/2014   MELANOMA, LEFT  LOWER ARM  . Educated about COVID-19 virus infection 07/01/2018  . Essential hypertension, benign 2009  . KNEE PAIN, RIGHT 09/13/2008   Qualifier: Diagnosis of  By: Cori Razor LPN, Brandi   Qualifier: Diagnosis of  By: Moshe Cipro MD, Joycelyn Schmid    . Metabolic syndrome X [id number]  . Morbid obesity ([place])   . Need for influenza vaccination 11/27/2018  . Rhinosinusitis    Past Surgical History:  Procedure Laterality Date  . ABDOMINAL HYSTERECTOMY     Partial   . JOINT REPLACEMENT  05/13/2011   Revision of left knee replacement in 2008  . MOLE REMOVAL  06/20/2014   Dr Harriet Masson  . Partial hysterectomy ,secondary to fibroid . Pt still have both ovaries  1978  . Rt. forearm surgery for nerve repair  1989  . TOTAL KNEE ARTHROPLASTY  2007   Right      Current Meds  Medication Sig  . azelastine (ASTELIN) 0.1 % nasal spray Place 2 sprays into both nostrils 2 (two) times daily. Use in each nostril as directed  . calcium-vitamin D (OSCAL WITH D) 500-200 MG-UNIT per tablet Take 1 tablet by mouth 3 (three) times daily.    Marland Kitchen Cod Liver Oil CAPS Take by mouth daily.    . furosemide (LASIX) 40 MG tablet Take 1 tablet (40 mg total) by mouth daily.  . Glucosamine-Chondroit-Vit C-Mn (GLUCOSAMINE 1500 COMPLEX PO) Take 1 capsule by mouth daily.  Marland Kitchen ibuprofen (ADVIL,MOTRIN) 800 MG tablet One tablet twice  daily , as needed , for acute knee pain  Do not take for more than 3 consecutive days  . loratadine (CLARITIN) 10 MG tablet Take 1 tablet (10 mg total) by mouth daily.  . Multiple Vitamin (MULTIVITAMIN PO) Take by mouth daily.    . potassium chloride SA (KLOR-CON M20) 20 MEQ tablet Take 1 tablet (20 mEq total) by mouth daily.  Marland Kitchen triamterene-hydrochlorothiazide (MAXZIDE-25) 37.5-25 MG tablet TAKE 1 AND 1/2 TABLETS ONE TIME DAILY  . [DISCONTINUED] phentermine (ADIPEX-P) 37.5 MG tablet Take 1 tablet (37.5 mg total) by  mouth daily before breakfast.     Allergies:   Influenza vaccines and Oxycodone hcl   ROS:   Please see the history of present illness.    All other systems reviewed and are negative.   Labs/Other Tests and Data Reviewed:    Recent Labs: 11/23/2018: ALT 13; BUN 17; Creat 0.93; Potassium 4.3; Sodium 141; TSH 1.56   Recent Lipid Panel Lab Results  Component Value Date/Time   CHOL 178 11/23/2018 11:58 AM   TRIG 102 11/23/2018 11:58 AM   HDL 52 11/23/2018 11:58 AM   CHOLHDL 3.4 11/23/2018 11:58 AM   LDLCALC 106 (H) 11/23/2018 11:58 AM    Wt Readings from Last 3 Encounters:  06/19/19 (!) 323 lb (146.5 kg)  11/27/18 (!) 323 lb 0.6 oz (146.5 kg)  09/07/18 (!) 317 lb (143.8 kg)     Objective:    Vital Signs:  BP 139/84   Ht 5\' 6"  (1.676 m)   Wt (!) 323 lb (146.5 kg)   BMI 52.13 kg/m    VITAL SIGNS:  reviewed GEN:  Alert and oriented RESPIRATORY:  No shortness of breath noted in conversation PSYCH:  Normal affect and mood  ASSESSMENT & PLAN:    1. MORBID OBESITY  - Lipid panel  2. Essential hypertension  - CBC - COMPLETE METABOLIC PANEL WITH GFR  3. Non-seasonal allergic rhinitis, unspecified trigger   4. Gastroesophageal reflux disease, unspecified whether esophagitis present   5. Prediabetes  - Hemoglobin A1c  6. Elevated lipoprotein(a)  - Lipid panel    Time:   Today, I have spent 15 minutes with the patient with telehealth technology discussing the above problems.     Medication Adjustments/Labs and Tests Ordered: Current medicines are reviewed at length with the patient today.  Concerns regarding medicines are outlined above.   Tests Ordered: Orders Placed This Encounter  Procedures  . CBC  . COMPLETE METABOLIC PANEL WITH GFR  . Hemoglobin A1c  . Lipid panel    Medication Changes: No orders of the defined types were placed in this encounter.   Disposition:  Follow up Oct for annual visit  Signed, Perlie Mayo, NP  06/19/2019  10:50 AM     [place]

## 2019-06-19 NOTE — Patient Instructions (Addendum)
I appreciate the opportunity to provide you with care for your health and wellness. Today we discussed:  Overall health  Follow up: Oct for annual visit   Fasting labs in the next week or 2  No referrals  Continue all medications as directed.   If any changes are found in your lab work and the need to be addressed we will call you.  Have a wonderful rest of the spring and summer. :)  Please continue to practice social distancing to keep you, your family, and our community safe.  If you must go out, please wear a mask and practice good handwashing.  It was a pleasure to see you and I look forward to continuing to work together on your health and well-being. Please do not hesitate to call the office if you need care or have questions about your care.  Have a wonderful day and week. With Gratitude, Cherly Beach, DNP, AGNP-BC

## 2019-06-19 NOTE — Assessment & Plan Note (Signed)
Pretty controlled, reports taking Nasal Spray and Claritin.

## 2019-06-19 NOTE — Assessment & Plan Note (Signed)
Controlled-no medication needed, diet controlled

## 2019-06-19 NOTE — Assessment & Plan Note (Signed)
Michele Lowe is encouraged to maintain a well balanced diet that is low in salt. Controlled, continue current medication regimen. Additionally, she is also reminded that exercise is beneficial for heart health and control of  Blood pressure. 30-60 minutes daily is recommended-walking was suggested.

## 2019-06-19 NOTE — Assessment & Plan Note (Addendum)
Linked: Hypertension, elevated cholesterol  Michele Lowe is re-educated about the importance of exercise daily to help with weight management. A minumum of 30 minutes daily is recommended. Additionally, importance of healthy food choices  with portion control discussed.   Wt Readings from Last 3 Encounters:  06/19/19 (!) 323 lb (146.5 kg)  11/27/18 (!) 323 lb 0.6 oz (146.5 kg)  09/07/18 (!) 317 lb (143.8 kg)

## 2019-06-19 NOTE — Assessment & Plan Note (Signed)
Will be getting updated labs.

## 2019-07-12 DIAGNOSIS — I10 Essential (primary) hypertension: Secondary | ICD-10-CM | POA: Diagnosis not present

## 2019-07-12 DIAGNOSIS — E7841 Elevated Lipoprotein(a): Secondary | ICD-10-CM | POA: Diagnosis not present

## 2019-07-12 DIAGNOSIS — R7303 Prediabetes: Secondary | ICD-10-CM | POA: Diagnosis not present

## 2019-07-13 LAB — CBC
HCT: 40.4 % (ref 35.0–45.0)
Hemoglobin: 13.3 g/dL (ref 11.7–15.5)
MCH: 28.3 pg (ref 27.0–33.0)
MCHC: 32.9 g/dL (ref 32.0–36.0)
MCV: 86 fL (ref 80.0–100.0)
MPV: 10.8 fL (ref 7.5–12.5)
Platelets: 229 10*3/uL (ref 140–400)
RBC: 4.7 10*6/uL (ref 3.80–5.10)
RDW: 13.7 % (ref 11.0–15.0)
WBC: 5.8 10*3/uL (ref 3.8–10.8)

## 2019-07-13 LAB — LIPID PANEL
Cholesterol: 188 mg/dL (ref <200)
HDL: 43 mg/dL — ABNORMAL LOW (ref >=50)
LDL Cholesterol (Calc): 124 mg/dL (calc) — ABNORMAL HIGH
Non-HDL Cholesterol (Calc): 145 mg/dL (calc) — ABNORMAL HIGH (ref <130)
Total CHOL/HDL Ratio: 4.4 (calc) (ref <5.0)
Triglycerides: 103 mg/dL (ref <150)

## 2019-07-13 LAB — COMPLETE METABOLIC PANEL WITH GFR
AG Ratio: 1.5 (calc) (ref 1.0–2.5)
ALT: 20 U/L (ref 6–29)
AST: 22 U/L (ref 10–35)
Albumin: 4.1 g/dL (ref 3.6–5.1)
Alkaline phosphatase (APISO): 97 U/L (ref 37–153)
BUN: 15 mg/dL (ref 7–25)
CO2: 32 mmol/L (ref 20–32)
Calcium: 9.6 mg/dL (ref 8.6–10.4)
Chloride: 101 mmol/L (ref 98–110)
Creat: 0.86 mg/dL (ref 0.60–0.93)
GFR, Est African American: 79 mL/min/{1.73_m2} (ref >=60)
GFR, Est Non African American: 68 mL/min/{1.73_m2} (ref >=60)
Globulin: 2.8 g/dL (calc) (ref 1.9–3.7)
Glucose, Bld: 106 mg/dL — ABNORMAL HIGH (ref 65–99)
Potassium: 3.5 mmol/L (ref 3.5–5.3)
Sodium: 141 mmol/L (ref 135–146)
Total Bilirubin: 0.5 mg/dL (ref 0.2–1.2)
Total Protein: 6.9 g/dL (ref 6.1–8.1)

## 2019-07-13 LAB — HEMOGLOBIN A1C
Hgb A1c MFr Bld: 5.3 % of total Hgb (ref <5.7)
Mean Plasma Glucose: 105 (calc)
eAG (mmol/L): 5.8 (calc)

## 2019-07-24 DIAGNOSIS — T8484XD Pain due to internal orthopedic prosthetic devices, implants and grafts, subsequent encounter: Secondary | ICD-10-CM | POA: Diagnosis not present

## 2019-07-24 DIAGNOSIS — M1712 Unilateral primary osteoarthritis, left knee: Secondary | ICD-10-CM | POA: Diagnosis not present

## 2019-07-24 DIAGNOSIS — Z471 Aftercare following joint replacement surgery: Secondary | ICD-10-CM | POA: Diagnosis not present

## 2019-07-24 DIAGNOSIS — M1711 Unilateral primary osteoarthritis, right knee: Secondary | ICD-10-CM | POA: Diagnosis not present

## 2019-07-24 DIAGNOSIS — Z96652 Presence of left artificial knee joint: Secondary | ICD-10-CM | POA: Diagnosis not present

## 2019-09-11 ENCOUNTER — Other Ambulatory Visit: Payer: Self-pay

## 2019-09-11 ENCOUNTER — Encounter: Payer: Self-pay | Admitting: Family Medicine

## 2019-09-11 ENCOUNTER — Telehealth (INDEPENDENT_AMBULATORY_CARE_PROVIDER_SITE_OTHER): Payer: Medicare PPO | Admitting: Family Medicine

## 2019-09-11 VITALS — BP 139/84 | Ht 66.0 in | Wt 323.0 lb

## 2019-09-11 DIAGNOSIS — Z0000 Encounter for general adult medical examination without abnormal findings: Secondary | ICD-10-CM

## 2019-09-11 NOTE — Progress Notes (Addendum)
Subjective:   Michele Lowe is a 69 y.o. female who presents for Medicare Annual (Subsequent) preventive examination.  Location of Patient: Home Location of Provider: Telephone/Office  Consent was obtain for visit to be over via telephone  I verified that I am speaking with the correct person using two identifiers.  Review of Systems    yes    Objective:    Today's Vitals   09/11/19 1442  BP: (!) 139/84  Weight: (!) 323 lb (146.5 kg)  Height: 5\' 6"  (1.676 m)  PainSc: 0-No pain   Body mass index is 52.13 kg/m.  Advanced Directives 09/05/2017 09/01/2016  Does Patient Have a Medical Advance Directive? No No  Would patient like information on creating a medical advance directive? Yes (ED - Information included in AVS) No - Patient declined    Current Medications (verified) Outpatient Encounter Medications as of 09/11/2019  Medication Sig  . azelastine (ASTELIN) 0.1 % nasal spray Place 2 sprays into both nostrils 2 (two) times daily. Use in each nostril as directed  . calcium-vitamin D (OSCAL WITH D) 500-200 MG-UNIT per tablet Take 1 tablet by mouth 3 (three) times daily.    Marland Kitchen Cod Liver Oil CAPS Take by mouth daily.    . furosemide (LASIX) 40 MG tablet Take 1 tablet (40 mg total) by mouth daily.  . Glucosamine-Chondroit-Vit C-Mn (GLUCOSAMINE 1500 COMPLEX PO) Take 1 capsule by mouth daily.  Marland Kitchen ibuprofen (ADVIL,MOTRIN) 800 MG tablet One tablet twice daily , as needed , for acute knee pain  Do not take for more than 3 consecutive days  . loratadine (CLARITIN) 10 MG tablet Take 1 tablet (10 mg total) by mouth daily.  . Multiple Vitamin (MULTIVITAMIN PO) Take by mouth daily.    . potassium chloride SA (KLOR-CON M20) 20 MEQ tablet Take 1 tablet (20 mEq total) by mouth daily.  Marland Kitchen triamterene-hydrochlorothiazide (MAXZIDE-25) 37.5-25 MG tablet TAKE 1 AND 1/2 TABLETS ONE TIME DAILY   No facility-administered encounter medications on file as of 09/11/2019.    Allergies  (verified) Codeine, Influenza vaccines, and Oxycodone hcl   History: Past Medical History:  Diagnosis Date  . Allergy   . Annual physical exam 08/04/2015  . Arthritis   . Cancer ([place]) 06/20/2014   MELANOMA, LEFT  LOWER ARM  . Educated about COVID-19 virus infection 07/01/2018  . Essential hypertension, benign 2009  . Hypertension    Phreesia 09/09/2019  . KNEE PAIN, RIGHT 09/13/2008   Qualifier: Diagnosis of  By: Cori Razor LPN, Brandi   Qualifier: Diagnosis of  By: Moshe Cipro MD, Joycelyn Schmid    . Metabolic syndrome X 09/19/1658  . Morbid obesity ([place])   . Need for influenza vaccination 11/27/2018  . Rhinosinusitis    Past Surgical History:  Procedure Laterality Date  . ABDOMINAL HYSTERECTOMY     Partial   . JOINT REPLACEMENT  05/13/2011   Revision of left knee replacement in 2008  . MOLE REMOVAL  06/20/2014   Dr Harriet Masson  . Partial hysterectomy ,secondary to fibroid . Pt still have both ovaries  1978  . Rt. forearm surgery for nerve repair  1989  . TOTAL KNEE ARTHROPLASTY  2007   Right    Family History  Problem Relation Age of Onset  . Heart failure Mother   . Hypertension Mother   . Heart disease Mother        Irregular heart valve  . Hypertension Father   . Emphysema Father   . Heart disease Father   .  COPD Father   . Diabetes Brother   . Heart failure Brother   . Diabetes Brother   . Diabetes Brother   . Hyperlipidemia Brother   . Hypertension Sister   . Hypertension Sister    Social History   Socioeconomic History  . Marital status: Divorced    Spouse name: Not on file  . Number of children: 3  . Years of education: Not on file  . Highest education level: Not on file  Occupational History  . Occupation: Disabled   Tobacco Use  . Smoking status: Never Smoker  . Smokeless tobacco: Never Used  Substance and Sexual Activity  . Alcohol use: No  . Drug use: No  . Sexual activity: Not Currently  Other Topics Concern  . Not on file  Social History Narrative  . Not  on file   Social Determinants of Health   Financial Resource Strain: Low Risk   . Difficulty of Paying Living Expenses: Not hard at all  Food Insecurity: No Food Insecurity  . Worried About Charity fundraiser in the Last Year: Never true  . Ran Out of Food in the Last Year: Never true  Transportation Needs: No Transportation Needs  . Lack of Transportation (Medical): No  . Lack of Transportation (Non-Medical): No  Physical Activity: Insufficiently Active  . Days of Exercise per Week: 3 days  . Minutes of Exercise per Session: 30 min  Stress: No Stress Concern Present  . Feeling of Stress : Only a little  Social Connections: Moderately Integrated  . Frequency of Communication with Friends and Family: More than three times a week  . Frequency of Social Gatherings with Friends and Family: More than three times a week  . Attends Religious Services: More than 4 times per year  . Active Member of Clubs or Organizations: Yes  . Attends Archivist Meetings: More than 4 times per year  . Marital Status: Divorced    Tobacco Counseling Counseling given: Not Answered   Clinical Intake:     Pain Score: 0-No pain           Diabetic?no         Activities of Daily Living In your present state of health, do you have any difficulty performing the following activities: 09/11/2019  Hearing? N  Vision? N  Difficulty concentrating or making decisions? N  Walking or climbing stairs? Y  Dressing or bathing? N  Doing errands, shopping? N  Some recent data might be hidden    Patient Care Team: Fayrene Helper, MD as PCP - General Devonne Doughty, MD as Referring Physician (Orthopedic Surgery) Satira Sark, MD as Consulting Physician (Cardiology) Garrel Ridgel, DPM as Consulting Physician (Podiatry)  Indicate any recent Medical Services you may have received from other than [facility] providers in the past year (date may be approximate).     Assessment:    This is a routine wellness examination for [place].  Hearing/Vision screen No exam data present  Dietary issues and exercise activities discussed: Current Exercise Habits: Home exercise routine, Time (Minutes): 30, Frequency (Times/Week): 3, Weekly Exercise (Minutes/Week): 90, Intensity: Moderate, Exercise limited by: None identified  Goals    . Weight (lb) < 200 lb (90.7 kg)     Would like to lose a little weight and exercise a little more       Depression Screen PHQ 2/9 Scores 09/11/2019 11/27/2018 09/07/2018 02/16/2018 09/05/2017 06/01/2017 09/01/2016  PHQ - 2 Score 0 0 0  0 0 0 0  PHQ- 9 Score 0 - - - - - -    Fall Risk Fall Risk  09/11/2019 06/19/2019 11/27/2018 09/07/2018 06/28/2018  Falls in the past year? 0 0 0 0 0  Number falls in past yr: 0 0 0 - 0  Injury with Fall? 0 0 0 0 0  Risk for fall due to : No Fall Risks - - - -  Follow up Falls evaluation completed - - - -    Any stairs in or around the home? no If so, are there any without handrails? No  Home free of loose throw rugs in walkways, pet beds, electrical cords, etc? Yes  Adequate lighting in your home to reduce risk of falls? Yes   ASSISTIVE DEVICES UTILIZED TO PREVENT FALLS:  Life alert? No  Use of a cane, walker or w/c? Yes  Grab bars in the bathroom? No  Shower chair or bench in shower? No  Elevated toilet seat or a handicapped toilet? Yes   TIMED UP AND GO:  Was the test performed? No .  Length of time to ambulate 10 feet: NA sec.     Cognitive Function:     6CIT Screen 09/11/2019 09/07/2018 09/05/2017  What Year? 0 points 0 points 0 points  What month? 0 points 0 points 0 points  What time? 0 points 0 points 0 points  Count back from 20 0 points 0 points 0 points  Months in reverse 0 points 0 points 0 points  Repeat phrase 4 points 0 points 6 points  Total Score 4 0 6    Immunizations Immunization History  Administered Date(s) Administered  . Fluad Quad(high Dose 65+) 11/27/2018  . Influenza Whole  11/23/2006  . Pneumococcal Conjugate-13 10/31/2013  . Pneumococcal Polysaccharide-23 08/04/2015  . Td 11/23/2006  . Zoster 02/13/2009    TDAP status: Up to date Flu Vaccine status: Up to date Pneumococcal vaccine status: Up to date Covid-19 vaccine status: Completed vaccines  Qualifies for Shingles Vaccine? Yes   Zostavax completed Yes   Shingrix Completed?: Yes  Screening Tests Health Maintenance  Topic Date Due  . COVID-19 Vaccine (1) Never done  . TETANUS/TDAP  11/27/2019 (Originally 11/22/2016)  . COLONOSCOPY  01/08/2021 (Originally 02/23/2016)  . INFLUENZA VACCINE  09/16/2019  . MAMMOGRAM  01/25/2021  . DEXA SCAN  Completed  . Hepatitis C Screening  Completed  . PNA vac Low Risk Adult  Completed    Health Maintenance  Health Maintenance Due  Topic Date Due  . COVID-19 Vaccine (1) Never done    Colorectal cancer screening: Completed 12-24-2006. Repeat every 10 years Mammogram status: Completed 01-26-19. Repeat every year Bone Density status: Completed 08-11-2015. Results reflect: Bone density results: NORMAL. Repeat every 5 years.  Lung Cancer Screening: (Low Dose CT Chest recommended if Age 72-80 years, 30 pack-year currently smoking OR have quit w/in 15years.) does not qualify.   Lung Cancer Screening Referral: no   Additional Screening:  Hepatitis C Screening: does not qualify; Completed   Vision Screening: Recommended annual ophthalmology exams for early detection of glaucoma and other disorders of the eye. Is the patient up to date with their annual eye exam?  No  Who is the provider or what is the name of the office in which the patient attends annual eye exams? Needs a referral  If pt is not established with a provider, would they like to be referred to a provider to establish care? Yes .   Dental  Screening: Recommended annual dental exams for proper oral hygiene  Community Resource Referral / Chronic Care Management: CRR required this visit?  No    CCM required this visit?  No      Plan:     1. Encounter for Medicare annual wellness exam   I have personally reviewed and noted the following in the patient's chart:   . Medical and social history . Use of alcohol, tobacco or illicit drugs  . Current medications and supplements . Functional ability and status . Nutritional status . Physical activity . Advanced directives . List of other physicians . Hospitalizations, surgeries, and ER visits in previous 12 months . Vitals . Screenings to include cognitive, depression, and falls . Referrals and appointments  In addition, I have reviewed and discussed with patient certain preventive protocols, quality metrics, and best practice recommendations. A written personalized care plan for preventive services as well as general preventive health recommendations were provided to patient.     Shelda Altes, CMA   09/11/2019   I provided 25 minutes of non-face-to-face time during this encounter.

## 2019-09-11 NOTE — Patient Instructions (Addendum)
Michele Lowe , Thank you for taking time to come for your Medicare Wellness Visit. I appreciate your ongoing commitment to your health goals. Please review the following plan we discussed and let me know if I can assist you in the future.   Please continue to practice social distancing to keep you, your family, and our community safe.  If you must go out, please wear a Mask and practice good handwashing.  Screening recommendations/referrals: Colonoscopy: up to date  Mammogram: up to date Bone Density: up to date  Recommended yearly ophthalmology/optometry visit for glaucoma screening and checkup Recommended yearly dental visit for hygiene and checkup  Vaccinations: up to date    Advanced directives:  Please compare your paperwork to ours and see if they are similar. If you have questions let us know.   Conditions/risks identified: Falls, Blood pressure   Next appointment: 12/04/2019   Preventive Care 71 Years and Older, Female Preventive care refers to lifestyle choices and visits with your health care provider that can promote health and wellness. What does preventive care include?  A yearly physical exam. This is also called an annual well check.  Dental exams once or twice a year.  Routine eye exams. Ask your health care provider how often you should have your eyes checked.  Personal lifestyle choices, including:  Daily care of your teeth and gums.  Regular physical activity.  Eating a healthy diet.  Avoiding tobacco and drug use.  Limiting alcohol use.  Practicing safe sex.  Taking low-dose aspirin every day.  Taking vitamin and mineral supplements as recommended by your health care provider. What happens during an annual well check? The services and screenings done by your health care provider during your annual well check will depend on your age, overall health, lifestyle risk factors, and family history of disease. Counseling  Your health care provider may  ask you questions about your:  Alcohol use.  Tobacco use.  Drug use.  Emotional well-being.  Home and relationship well-being.  Sexual activity.  Eating habits.  History of falls.  Memory and ability to understand (cognition).  Work and work Statistician.  Reproductive health. Screening  You may have the following tests or measurements:  Height, weight, and BMI.  Blood pressure.  Lipid and cholesterol levels. These may be checked every 5 years, or more frequently if you are over 51 years old.  Skin check.  Lung cancer screening. You may have this screening every year starting at age 36 if you have a 30-pack-year history of smoking and currently smoke or have quit within the past 15 years.  Fecal occult blood test (FOBT) of the stool. You may have this test every year starting at age 76.  Flexible sigmoidoscopy or colonoscopy. You may have a sigmoidoscopy every 5 years or a colonoscopy every 10 years starting at age 67.  Hepatitis C blood test.  Hepatitis B blood test.  Sexually transmitted disease (STD) testing.  Diabetes screening. This is done by checking your blood sugar (glucose) after you have not eaten for a while (fasting). You may have this done every 1-3 years.  Bone density scan. This is done to screen for osteoporosis. You may have this done starting at age 55.  Mammogram. This may be done every 1-2 years. Talk to your health care provider about how often you should have regular mammograms. Talk with your health care provider about your test results, treatment options, and if necessary, the need for more tests. Vaccines  Your  health care provider may recommend certain vaccines, such as:  Influenza vaccine. This is recommended every year.  Tetanus, diphtheria, and acellular pertussis (Tdap, Td) vaccine. You may need a Td booster every 10 years.  Zoster vaccine. You may need this after age 9.  Pneumococcal 13-valent conjugate (PCV13) vaccine. One  dose is recommended after age 9.  Pneumococcal polysaccharide (PPSV23) vaccine. One dose is recommended after age 31. Talk to your health care provider about which screenings and vaccines you need and how often you need them. This information is not intended to replace advice given to you by your health care provider. Make sure you discuss any questions you have with your health care provider. Document Released: 02/28/2015 Document Revised: 10/22/2015 Document Reviewed: 12/03/2014 Elsevier Interactive Patient Education  2017 [place] Prevention in the Home Falls can cause injuries. They can happen to people of all ages. There are many things you can do to make your home safe and to help prevent falls. What can I do on the outside of my home?  Regularly fix the edges of walkways and driveways and fix any cracks.  Remove anything that might make you trip as you walk through a door, such as a raised step or threshold.  Trim any bushes or trees on the path to your home.  Use bright outdoor lighting.  Clear any walking paths of anything that might make someone trip, such as rocks or tools.  Regularly check to see if handrails are loose or broken. Make sure that both sides of any steps have handrails.  Any raised decks and porches should have guardrails on the edges.  Have any leaves, snow, or ice cleared regularly.  Use sand or salt on walking paths during winter.  Clean up any spills in your garage right away. This includes oil or grease spills. What can I do in the bathroom?  Use night lights.  Install grab bars by the toilet and in the tub and shower. Do not use towel bars as grab bars.  Use non-skid mats or decals in the tub or shower.  If you need to sit down in the shower, use a plastic, non-slip stool.  Keep the floor dry. Clean up any water that spills on the floor as soon as it happens.  Remove soap buildup in the tub or shower regularly.  Attach bath  mats securely with double-sided non-slip rug tape.  Do not have throw rugs and other things on the floor that can make you trip. What can I do in the bedroom?  Use night lights.  Make sure that you have a light by your bed that is easy to reach.  Do not use any sheets or blankets that are too big for your bed. They should not hang down onto the floor.  Have a firm chair that has side arms. You can use this for support while you get dressed.  Do not have throw rugs and other things on the floor that can make you trip. What can I do in the kitchen?  Clean up any spills right away.  Avoid walking on wet floors.  Keep items that you use a lot in easy-to-reach places.  If you need to reach something above you, use a strong step stool that has a grab bar.  Keep electrical cords out of the way.  Do not use floor polish or wax that makes floors slippery. If you must use wax, use non-skid floor wax.  Do not  have throw rugs and other things on the floor that can make you trip. What can I do with my stairs?  Do not leave any items on the stairs.  Make sure that there are handrails on both sides of the stairs and use them. Fix handrails that are broken or loose. Make sure that handrails are as long as the stairways.  Check any carpeting to make sure that it is firmly attached to the stairs. Fix any carpet that is loose or worn.  Avoid having throw rugs at the top or bottom of the stairs. If you do have throw rugs, attach them to the floor with carpet tape.  Make sure that you have a light switch at the top of the stairs and the bottom of the stairs. If you do not have them, ask someone to add them for you. What else can I do to help prevent falls?  Wear shoes that:  Do not have high heels.  Have rubber bottoms.  Are comfortable and fit you well.  Are closed at the toe. Do not wear sandals.  If you use a stepladder:  Make sure that it is fully opened. Do not climb a closed  stepladder.  Make sure that both sides of the stepladder are locked into place.  Ask someone to hold it for you, if possible.  Clearly mark and make sure that you can see:  Any grab bars or handrails.  First and last steps.  Where the edge of each step is.  Use tools that help you move around (mobility aids) if they are needed. These include:  Canes.  Walkers.  Scooters.  Crutches.  Turn on the lights when you go into a dark area. Replace any light bulbs as soon as they burn out.  Set up your furniture so you have a clear path. Avoid moving your furniture around.  If any of your floors are uneven, fix them.  If there are any pets around you, be aware of where they are.  Review your medicines with your doctor. Some medicines can make you feel dizzy. This can increase your chance of falling. Ask your doctor what other things that you can do to help prevent falls. This information is not intended to replace advice given to you by your health care provider. Make sure you discuss any questions you have with your health care provider. Document Released: 11/28/2008 Document Revised: 07/10/2015 Document Reviewed: 03/08/2014 Elsevier Interactive Patient Education  2017 [employer].

## 2019-10-03 ENCOUNTER — Other Ambulatory Visit: Payer: Self-pay | Admitting: Family Medicine

## 2019-12-04 ENCOUNTER — Other Ambulatory Visit: Payer: Self-pay | Admitting: Family Medicine

## 2019-12-04 ENCOUNTER — Other Ambulatory Visit: Payer: Self-pay

## 2019-12-04 ENCOUNTER — Ambulatory Visit (INDEPENDENT_AMBULATORY_CARE_PROVIDER_SITE_OTHER): Payer: Medicare PPO | Admitting: Family Medicine

## 2019-12-04 VITALS — BP 125/88 | HR 76 | Resp 16 | Ht 66.0 in | Wt 313.0 lb

## 2019-12-04 DIAGNOSIS — Z0001 Encounter for general adult medical examination with abnormal findings: Secondary | ICD-10-CM | POA: Diagnosis not present

## 2019-12-04 DIAGNOSIS — I10 Essential (primary) hypertension: Secondary | ICD-10-CM

## 2019-12-04 DIAGNOSIS — E8881 Metabolic syndrome: Secondary | ICD-10-CM

## 2019-12-04 DIAGNOSIS — E559 Vitamin D deficiency, unspecified: Secondary | ICD-10-CM

## 2019-12-04 DIAGNOSIS — Z23 Encounter for immunization: Secondary | ICD-10-CM

## 2019-12-04 DIAGNOSIS — M25561 Pain in right knee: Secondary | ICD-10-CM | POA: Diagnosis not present

## 2019-12-04 DIAGNOSIS — Z780 Asymptomatic menopausal state: Secondary | ICD-10-CM

## 2019-12-04 DIAGNOSIS — Z1231 Encounter for screening mammogram for malignant neoplasm of breast: Secondary | ICD-10-CM

## 2019-12-04 MED ORDER — KETOROLAC TROMETHAMINE 60 MG/2ML IM SOLN
60.0000 mg | Freq: Once | INTRAMUSCULAR | Status: AC
  Administered 2019-12-04: 60 mg via INTRAMUSCULAR

## 2019-12-04 MED ORDER — PREDNISONE 20 MG PO TABS: ORAL_TABLET | ORAL | 0 refills | Status: DC

## 2019-12-04 MED ORDER — PANTOPRAZOLE SODIUM 40 MG PO TBEC: 40.0000 mg | DELAYED_RELEASE_TABLET | Freq: Every day | ORAL | 0 refills | Status: DC

## 2019-12-04 MED ORDER — METHYLPREDNISOLONE ACETATE 80 MG/ML IJ SUSP
80.0000 mg | Freq: Once | INTRAMUSCULAR | Status: AC
  Administered 2019-12-04: 80 mg via INTRAMUSCULAR

## 2019-12-04 MED ORDER — IBUPROFEN 800 MG PO TABS: ORAL_TABLET | ORAL | 0 refills | Status: DC

## 2019-12-04 MED ORDER — CALCIUM CARBONATE-VITAMIN D 500-200 MG-UNIT PO TABS: 1.0000 | ORAL_TABLET | Freq: Two times a day (BID) | ORAL | 3 refills | Status: AC

## 2019-12-04 NOTE — Patient Instructions (Addendum)
F/U in office with MD end February, call if you need me sooner. Weight loss goal of 10 pounds    Flu vaccine today  Please schedule December mammogram and dexa at checkout   For right knee pain toradol 60 mg iM and depo Medrol 80 MG iM in office, also short course of ibuprofen, prednisone and protonix  Are sent to [facility]  Please take two calcium with D tablets daily  TSH, chem 7 and eGFR  And vit D today  Think about what you will eat, plan ahead. Choose " clean, green, fresh or frozen" over canned, processed or packaged foods which are more sugary, salty and fatty. 70 to 75% of food eaten should be vegetables and fruit. Three meals at set times with snacks allowed between meals, but they must be fruit or vegetables. Aim to eat over a 12 hour period , example 7 am to 7 pm, and STOP after  your last meal of the day. Drink water,generally about 64 ounces per day, no other drink is as healthy. Fruit juice is best enjoyed in a healthy way, by EATING the fruit. It is important that you exercise regularly at least 30 minutes 5 times a week. If you develop chest pain, have severe difficulty breathing, or feel very tired, stop exercising immediately and seek medical attention

## 2019-12-04 NOTE — Progress Notes (Signed)
    Michele Lowe     MRN: [id number]      DOB: [birth year]  HPI: Patient is in for annual physical exam. 2 week h/o severe anterior right knee pain, no inciting trauma has Osteoarthritis , being followed by [facility]. Recent labs,  are reviewed. Immunization is reviewed , and  updated .   PE: BP 125/88   Pulse 76   Resp 16   Ht 5\' 6"  (1.676 m)   Wt (!) 313 lb (142 kg)   SpO2 95%   BMI 50.52 kg/m   Pleasant  female, alert and oriented x 3, in no cardio-pulmonary distress. Afebrile. HEENT No facial trauma or asymetry. Sinuses non tender.  Extra occullar muscles intact.. External ears normal, . Neck: supple, no adenopathy,JVD or thyromegaly.No bruits.  Chest: Clear to ascultation bilaterally.No crackles or wheezes. Non tender to palpation  Breast: Not examined  Cardiovascular system; Heart sounds normal,  S1 and  S2 ,no S3.  No murmur, or thrill. Abdomen: Soft, non tender, No guarding, tenderness or rebound.    Musculoskeletal exam: Decreased  ROM of spine, hips , shoulders and reduced in right  knees  deformity ,swelling and  crepitus noted.in right knee No muscle wasting or atrophy.   Neurologic: Cranial nerves 2 to 12 intact. Power, tone ,sensation and reflexes normal throughout. disturbance in gait. No tremor.  Skin: Intact, no ulceration, erythema , scaling or rash noted. Pigmentation normal throughout  Psych; Normal mood and affect. Judgement and concentration normal   Assessment & Plan:  Encounter for annual general medical examination with abnormal findings in adult Annual exam as documented. Counseling done  re healthy lifestyle involving commitment to 150 minutes exercise per week, heart healthy diet, and attaining healthy weight.The importance of adequate sleep also discussed. Regular seat belt use and home safety, is also discussed. Changes in health habits are decided on by the patient with goals and time frames  set for achieving  them. Immunization and cancer screening needs are specifically addressed at this visit.   Acute pain of right knee Uncontrolled.Toradol and depo medrol administered IM in the office , to be followed by a short course of oral prednisone and NSAIDS.   MORBID OBESITY  Patient re-educated about  the importance of commitment to a  minimum of 150 minutes of exercise per week as able.  The importance of healthy food choices with portion control discussed, as well as eating regularly and within a 12 hour window most days. The need to choose "clean , green" food 50 to 75% of the time is discussed, as well as to make water the primary drink and set a goal of 64 ounces water daily.    Weight /BMI 12/04/2019 09/11/2019 06/19/2019  WEIGHT 313 lb 323 lb 323 lb  HEIGHT 5\' 6"  5\' 6"  5\' 6"   BMI 50.52 kg/m2 52.13 kg/m2 52.13 kg/m2

## 2019-12-05 LAB — BASIC METABOLIC PANEL WITH GFR
BUN/Creatinine Ratio: 17 (calc) (ref 6–22)
BUN: 16 mg/dL (ref 7–25)
CO2: 34 mmol/L — ABNORMAL HIGH (ref 20–32)
Calcium: 10.2 mg/dL (ref 8.6–10.4)
Chloride: 99 mmol/L (ref 98–110)
Creat: 0.95 mg/dL — ABNORMAL HIGH (ref 0.60–0.93)
GFR, Est African American: 70 mL/min/{1.73_m2} (ref >=60)
GFR, Est Non African American: 61 mL/min/{1.73_m2} (ref >=60)
Glucose, Bld: 95 mg/dL (ref 65–99)
Potassium: 3.9 mmol/L (ref 3.5–5.3)
Sodium: 142 mmol/L (ref 135–146)

## 2019-12-05 LAB — VITAMIN D 25 HYDROXY (VIT D DEFICIENCY, FRACTURES): Vit D, 25-Hydroxy: 40 ng/mL (ref 30–100)

## 2019-12-08 ENCOUNTER — Encounter: Payer: Self-pay | Admitting: Family Medicine

## 2019-12-08 DIAGNOSIS — M25561 Pain in right knee: Secondary | ICD-10-CM | POA: Insufficient documentation

## 2019-12-08 DIAGNOSIS — Z0001 Encounter for general adult medical examination with abnormal findings: Secondary | ICD-10-CM | POA: Insufficient documentation

## 2019-12-08 NOTE — Assessment & Plan Note (Signed)
  Patient re-educated about  the importance of commitment to a  minimum of 150 minutes of exercise per week as able.  The importance of healthy food choices with portion control discussed, as well as eating regularly and within a 12 hour window most days. The need to choose "clean , green" food 50 to 75% of the time is discussed, as well as to make water the primary drink and set a goal of 64 ounces water daily.    Weight /BMI 12/04/2019 09/11/2019 06/19/2019  WEIGHT 313 lb 323 lb 323 lb  HEIGHT 5\' 6"  5\' 6"  5\' 6"   BMI 50.52 kg/m2 52.13 kg/m2 52.13 kg/m2

## 2019-12-08 NOTE — Assessment & Plan Note (Signed)
Uncontrolled.Toradol and depo medrol administered IM in the office , to be followed by a short course of oral prednisone and NSAIDS.  

## 2019-12-08 NOTE — Assessment & Plan Note (Signed)

## 2020-01-11 ENCOUNTER — Other Ambulatory Visit: Payer: Self-pay | Admitting: Family Medicine

## 2020-01-18 ENCOUNTER — Other Ambulatory Visit: Payer: Self-pay | Admitting: *Deleted

## 2020-01-23 DIAGNOSIS — M170 Bilateral primary osteoarthritis of knee: Secondary | ICD-10-CM | POA: Diagnosis not present

## 2020-01-23 DIAGNOSIS — M1711 Unilateral primary osteoarthritis, right knee: Secondary | ICD-10-CM | POA: Diagnosis not present

## 2020-01-23 DIAGNOSIS — T8484XD Pain due to internal orthopedic prosthetic devices, implants and grafts, subsequent encounter: Secondary | ICD-10-CM | POA: Diagnosis not present

## 2020-01-23 DIAGNOSIS — Z471 Aftercare following joint replacement surgery: Secondary | ICD-10-CM | POA: Diagnosis not present

## 2020-01-23 DIAGNOSIS — Z96652 Presence of left artificial knee joint: Secondary | ICD-10-CM | POA: Diagnosis not present

## 2020-01-23 DIAGNOSIS — M1712 Unilateral primary osteoarthritis, left knee: Secondary | ICD-10-CM | POA: Diagnosis not present

## 2020-01-29 ENCOUNTER — Other Ambulatory Visit: Payer: Self-pay

## 2020-01-29 MED ORDER — FUROSEMIDE 40 MG PO TABS
40.0000 mg | ORAL_TABLET | Freq: Every day | ORAL | 1 refills | Status: DC
Start: 2020-01-29 — End: 2020-06-09

## 2020-01-30 ENCOUNTER — Ambulatory Visit (HOSPITAL_COMMUNITY)
Admission: RE | Admit: 2020-01-30 | Discharge: 2020-01-30 | Disposition: A | Discharge status: Home / Self Care | Payer: Medicare PPO | Source: Ambulatory Visit | Attending: Family Medicine | Admitting: Family Medicine

## 2020-01-30 ENCOUNTER — Other Ambulatory Visit: Payer: Self-pay

## 2020-01-30 DIAGNOSIS — Z1231 Encounter for screening mammogram for malignant neoplasm of breast: Secondary | ICD-10-CM | POA: Insufficient documentation

## 2020-01-30 DIAGNOSIS — Z90710 Acquired absence of both cervix and uterus: Secondary | ICD-10-CM | POA: Diagnosis not present

## 2020-01-30 DIAGNOSIS — M85852 Other specified disorders of bone density and structure, left thigh: Secondary | ICD-10-CM | POA: Diagnosis not present

## 2020-01-30 DIAGNOSIS — R29890 Loss of height: Secondary | ICD-10-CM | POA: Diagnosis not present

## 2020-01-30 DIAGNOSIS — Z780 Asymptomatic menopausal state: Secondary | ICD-10-CM | POA: Diagnosis not present

## 2020-04-09 ENCOUNTER — Other Ambulatory Visit: Payer: Self-pay

## 2020-04-09 ENCOUNTER — Telehealth (INDEPENDENT_AMBULATORY_CARE_PROVIDER_SITE_OTHER): Payer: Medicare HMO | Admitting: Family Medicine

## 2020-04-09 ENCOUNTER — Encounter: Payer: Self-pay | Admitting: Family Medicine

## 2020-04-09 VITALS — Ht 67.0 in | Wt 311.0 lb

## 2020-04-09 DIAGNOSIS — I10 Essential (primary) hypertension: Secondary | ICD-10-CM | POA: Diagnosis not present

## 2020-04-09 DIAGNOSIS — Z13220 Encounter for screening for lipoid disorders: Secondary | ICD-10-CM

## 2020-04-09 DIAGNOSIS — E559 Vitamin D deficiency, unspecified: Secondary | ICD-10-CM

## 2020-04-09 DIAGNOSIS — R7303 Prediabetes: Secondary | ICD-10-CM | POA: Diagnosis not present

## 2020-04-09 NOTE — Patient Instructions (Addendum)
F/U in office evaluate weight and blood pressure in 4 months, call if you needme soner  No medication changes  Congrats on weight loss , keep it up!  Please get fasting CBC, lipid, cmp and EGFr, TSH, Vit D 5 to 7 days before next appointment  Please try to get the 2 vaccines that you need at your pharmacy.  These are TdAP,   Shingrix #1 and Shingrix #2   Calorie Counting for Weight Loss Calories are units of energy. Your body needs a certain number of calories from food to keep going throughout the day. When you eat or drink more calories than your body needs, your body stores the extra calories mostly as fat. When you eat or drink fewer calories than your body needs, your body burns fat to get the energy it needs. Calorie counting means keeping track of how many calories you eat and drink each day. Calorie counting can be helpful if you need to lose weight. If you eat fewer calories than your body needs, you should lose weight. Ask your health care provider what a healthy weight is for you. For calorie counting to work, you will need to eat the right number of calories each day to lose a healthy amount of weight per week. A dietitian can help you figure out how many calories you need in a day and will suggest ways to reach your calorie goal.  A healthy amount of weight to lose each week is usually 1-2 lb (0.5-0.9 kg). This usually means that your daily calorie intake should be reduced by 500-750 calories.  Eating 1,200-1,500 calories a day can help most women lose weight.  Eating 1,500-1,800 calories a day can help most men lose weight. What do I need to know about calorie counting? Work with your health care provider or dietitian to determine how many calories you should get each day. To meet your daily calorie goal, you will need to:  Find out how many calories are in each food that you would like to eat. Try to do this before you eat.  Decide how much of the food you plan to  eat.  Keep a food log. Do this by writing down what you ate and how many calories it had. To successfully lose weight, it is important to balance calorie counting with a healthy lifestyle that includes regular activity. Where do I find calorie information? The number of calories in a food can be found on a Nutrition Facts label. If a food does not have a Nutrition Facts label, try to look up the calories online or ask your dietitian for help. Remember that calories are listed per serving. If you choose to have more than one serving of a food, you will have to multiply the calories per serving by the number of servings you plan to eat. For example, the label on a package of bread might say that a serving size is 1 slice and that there are 90 calories in a serving. If you eat 1 slice, you will have eaten 90 calories. If you eat 2 slices, you will have eaten 180 calories.   How do I keep a food log? After each time that you eat, record the following in your food log as soon as possible:  What you ate. Be sure to include toppings, sauces, and other extras on the food.  How much you ate. This can be measured in cups, ounces, or number of items.  How many calories were  in each food and drink.  The total number of calories in the food you ate. Keep your food log near you, such as in a pocket-sized notebook or on an app or website on your mobile phone. Some programs will calculate calories for you and show you how many calories you have left to meet your daily goal. What are some portion-control tips?  Know how many calories are in a serving. This will help you know how many servings you can have of a certain food.  Use a measuring cup to measure serving sizes. You could also try weighing out portions on a kitchen scale. With time, you will be able to estimate serving sizes for some foods.  Take time to put servings of different foods on your favorite plates or in your favorite bowls and cups so you  know what a serving looks like.  Try not to eat straight from a food's packaging, such as from a bag or box. Eating straight from the package makes it hard to see how much you are eating and can lead to overeating. Put the amount you would like to eat in a cup or on a plate to make sure you are eating the right portion.  Use smaller plates, glasses, and bowls for smaller portions and to prevent overeating.  Try not to multitask. For example, avoid watching TV or using your computer while eating. If it is time to eat, sit down at a table and enjoy your food. This will help you recognize when you are full. It will also help you be more mindful of what and how much you are eating. What are tips for following this plan? Reading food labels  Check the calorie count compared with the serving size. The serving size may be smaller than what you are used to eating.  Check the source of the calories. Try to choose foods that are high in protein, fiber, and vitamins, and low in saturated fat, trans fat, and sodium. Shopping  Read nutrition labels while you shop. This will help you make healthy decisions about which foods to buy.  Pay attention to nutrition labels for low-fat or fat-free foods. These foods sometimes have the same number of calories or more calories than the full-fat versions. They also often have added sugar, starch, or salt to make up for flavor that was removed with the fat.  Make a grocery list of lower-calorie foods and stick to it. Cooking  Try to cook your favorite foods in a healthier way. For example, try baking instead of frying.  Use low-fat dairy products. Meal planning  Use more fruits and vegetables. One-half of your plate should be fruits and vegetables.  Include lean proteins, such as chicken, [place], and fish. Lifestyle Each week, aim to do one of the following:  150 minutes of moderate exercise, such as walking.  75 minutes of vigorous exercise, such as  running. General information  Know how many calories are in the foods you eat most often. This will help you calculate calorie counts faster.  Find a way of tracking calories that works for you. Get creative. Try different apps or programs if writing down calories does not work for you. What foods should I eat?  Eat nutritious foods. It is better to have a nutritious, high-calorie food, such as an avocado, than a food with few nutrients, such as a bag of potato chips.  Use your calories on foods and drinks that will fill you up and  will not leave you hungry soon after eating. ? Examples of foods that fill you up are nuts and nut butters, vegetables, lean proteins, and high-fiber foods such as whole grains. High-fiber foods are foods with more than 5 g of fiber per serving.  Pay attention to calories in drinks. Low-calorie drinks include water and unsweetened drinks. The items listed above may not be a complete list of foods and beverages you can eat. Contact a dietitian for more information.   What foods should I limit? Limit foods or drinks that are not good sources of vitamins, minerals, or protein or that are high in unhealthy fats. These include:  Candy.  Other sweets.  Sodas, specialty coffee drinks, alcohol, and juice. The items listed above may not be a complete list of foods and beverages you should avoid. Contact a dietitian for more information. How do I count calories when eating out?  Pay attention to portions. Often, portions are much larger when eating out. Try these tips to keep portions smaller: ? Consider sharing a meal instead of getting your own. ? If you get your own meal, eat only half of it. Before you start eating, ask for a container and put half of your meal into it. ? When available, consider ordering smaller portions from the menu instead of full portions.  Pay attention to your food and drink choices. Knowing the way food is cooked and what is included with  the meal can help you eat fewer calories. ? If calories are listed on the menu, choose the lower-calorie options. ? Choose dishes that include vegetables, fruits, whole grains, low-fat dairy products, and lean proteins. ? Choose items that are boiled, broiled, grilled, or steamed. Avoid items that are buttered, battered, fried, or served with cream sauce. Items labeled as crispy are usually fried, unless stated otherwise. ? Choose water, low-fat milk, unsweetened iced tea, or other drinks without added sugar. If you want an alcoholic beverage, choose a lower-calorie option, such as a glass of wine or light beer. ? Ask for dressings, sauces, and syrups on the side. These are usually high in calories, so you should limit the amount you eat. ? If you want a salad, choose a garden salad and ask for grilled meats. Avoid extra toppings such as bacon, cheese, or fried items. Ask for the dressing on the side, or ask for olive oil and vinegar or lemon to use as dressing.  Estimate how many servings of a food you are given. Knowing serving sizes will help you be aware of how much food you are eating at restaurants. Where to find more information  Centers for Disease Control and Prevention: http://www.wolf.info/  [place] Department of Agriculture: http://www.wilson-mendoza.org/ Summary  Calorie counting means keeping track of how many calories you eat and drink each day. If you eat fewer calories than your body needs, you should lose weight.  A healthy amount of weight to lose per week is usually 1-2 lb (0.5-0.9 kg). This usually means reducing your daily calorie intake by 500-750 calories.  The number of calories in a food can be found on a Nutrition Facts label. If a food does not have a Nutrition Facts label, try to look up the calories online or ask your dietitian for help.  Use smaller plates, glasses, and bowls for smaller portions and to prevent overeating.  Use your calories on foods and drinks that will fill you up and not  leave you hungry shortly after a meal. This information is  not intended to replace advice given to you by your health care provider. Make sure you discuss any questions you have with your health care provider. Document Revised: 03/15/2019 Document Reviewed: 03/15/2019 Elsevier Patient Education  2021 [employer].

## 2020-04-09 NOTE — Assessment & Plan Note (Signed)
  Patient re-educated about  the importance of commitment to a  minimum of 150 minutes of exercise per week as able.  The importance of healthy food choices with portion control discussed, as well as eating regularly and within a 12 hour window most days. The need to choose "clean , green" food 50 to 75% of the time is discussed, as well as to make water the primary drink and set a goal of 64 ounces water daily.    Weight /BMI 04/09/2020 12/04/2019 09/11/2019  WEIGHT 311 lb 313 lb 323 lb  HEIGHT 5\' 7"  5\' 6"  5\' 6"   BMI 48.71 kg/m2 50.52 kg/m2 52.13 kg/m2

## 2020-04-09 NOTE — Progress Notes (Signed)
Virtual Visit via Telephone Note  I connected with Michele Lowe on 04/09/20 at  9:40 AM EST by telephone and verified that I am speaking with the correct person using two identifiers.  Location: Patient: home Provider: office   I discussed the limitations, risks, security and privacy concerns of performing an evaluation and management service by telephone and the availability of in person appointments. I also discussed with the patient that there may be a patient responsible charge related to this service. The patient expressed understanding and agreed to proceed.   History of Present Illness: Doing well, with slow and steady weight loss because of deliberate food choice No skin breakdown on leg and was recently seen at [facility] where she is  A chronic patient  F/U chronic problems and address any new or current concerns. Review and update medications and allergies. Review recent lab and radiologic data . Update routine health maintainace. Review an encourage improved health habits to include nutrition, exercise and  sleep .  Denies recent fever or chills. Denies sinus pressure, nasal congestion, ear pain or sore throat. Denies chest congestion, productive cough or wheezing. Denies chest pains, palpitations and leg swelling Denies abdominal pain, nausea, vomiting,diarrhea or constipation.   Denies dysuria, frequency, hesitancy or incontinence. Denies uncontrolled joint pain, swelling and limitation in mobility. Denies headaches, seizures, numbness, or tingling. Denies depression, anxiety or insomnia. Denies skin break down or rash.     Observations/Objective: Ht 5\' 7"  (1.702 m)   Wt (!) 311 lb (141.1 kg)   BMI 48.71 kg/m  Good communication with no confusion and intact memory. Alert and oriented x 3 No signs of respiratory distress during speech    Assessment and Plan: Essential hypertension Controlled, no change in medication DASH diet and commitment to daily physical  activity for a minimum of 30 minutes discussed and encouraged, as a part of hypertension management. The importance of attaining a healthy weight is also discussed.  BP/Weight 04/09/2020 12/04/2019 09/11/2019 06/19/2019 11/27/2018 09/07/2018 04/22/6576  Systolic BP - 469 629 528 413 244 010  Diastolic BP - 88 84 84 84 82 82  Wt. (Lbs) 311 313 323 323 323.04 317 317  BMI 48.71 50.52 52.13 52.13 50.6 51.17 51.17       MORBID OBESITY  Patient re-educated about  the importance of commitment to a  minimum of 150 minutes of exercise per week as able.  The importance of healthy food choices with portion control discussed, as well as eating regularly and within a 12 hour window most days. The need to choose "clean , green" food 50 to 75% of the time is discussed, as well as to make water the primary drink and set a goal of 64 ounces water daily.    Weight /BMI 04/09/2020 12/04/2019 09/11/2019  WEIGHT 311 lb 313 lb 323 lb  HEIGHT 5\' 7"  5\' 6"  5\' 6"   BMI 48.71 kg/m2 50.52 kg/m2 52.13 kg/m2        Follow Up Instructions:    I discussed the assessment and treatment plan with the patient. The patient was provided an opportunity to ask questions and all were answered. The patient agreed with the plan and demonstrated an understanding of the instructions.   The patient was advised to call back or seek an in-person evaluation if the symptoms worsen or if the condition fails to improve as anticipated.  I provided 15 minutes of non-face-to-face time during this encounter.   Tula Nakayama, MD

## 2020-04-09 NOTE — Assessment & Plan Note (Signed)
Controlled, no change in medication DASH diet and commitment to daily physical activity for a minimum of 30 minutes discussed and encouraged, as a part of hypertension management. The importance of attaining a healthy weight is also discussed.  BP/Weight 04/09/2020 12/04/2019 09/11/2019 06/19/2019 11/27/2018 09/07/2018 3/64/6803  Systolic BP - 212 248 250 037 048 889  Diastolic BP - 88 84 84 84 82 82  Wt. (Lbs) 311 313 323 323 323.04 317 317  BMI 48.71 50.52 52.13 52.13 50.6 51.17 51.17

## 2020-06-09 ENCOUNTER — Other Ambulatory Visit: Payer: Self-pay | Admitting: Family Medicine

## 2020-07-30 ENCOUNTER — Other Ambulatory Visit: Payer: Self-pay | Admitting: Family Medicine

## 2020-08-07 ENCOUNTER — Ambulatory Visit: Payer: Medicare HMO | Admitting: Family Medicine

## 2020-08-26 DIAGNOSIS — I10 Essential (primary) hypertension: Secondary | ICD-10-CM | POA: Diagnosis not present

## 2020-08-26 DIAGNOSIS — E559 Vitamin D deficiency, unspecified: Secondary | ICD-10-CM | POA: Diagnosis not present

## 2020-08-26 LAB — FECAL OCCULT BLOOD, IMMUNOCHEMICAL: IFOBT: NEGATIVE

## 2020-08-27 LAB — LIPID PANEL
Chol/HDL Ratio: 5.9 ratio — ABNORMAL HIGH (ref 0.0–4.4)
Cholesterol, Total: 226 mg/dL — ABNORMAL HIGH (ref 100–199)
HDL: 38 mg/dL — ABNORMAL LOW (ref >39)
LDL Chol Calc (NIH): 160 mg/dL — ABNORMAL HIGH (ref 0–99)
Triglycerides: 155 mg/dL — ABNORMAL HIGH (ref 0–149)
VLDL Cholesterol Cal: 28 mg/dL (ref 5–40)

## 2020-08-27 LAB — CMP14+EGFR
ALT: 21 IU/L (ref 0–32)
AST: 26 IU/L (ref 0–40)
Albumin/Globulin Ratio: 1.8 (ref 1.2–2.2)
Albumin: 4.6 g/dL (ref 3.7–4.7)
Alkaline Phosphatase: 113 IU/L (ref 44–121)
BUN/Creatinine Ratio: 17 (ref 12–28)
BUN: 19 mg/dL (ref 8–27)
Bilirubin Total: 0.5 mg/dL (ref 0.0–1.2)
CO2: 27 mmol/L (ref 20–29)
Calcium: 9.5 mg/dL (ref 8.7–10.3)
Chloride: 96 mmol/L (ref 96–106)
Creatinine, Ser: 1.1 mg/dL — ABNORMAL HIGH (ref 0.57–1.00)
Globulin, Total: 2.5 g/dL (ref 1.5–4.5)
Glucose: 94 mg/dL (ref 65–99)
Potassium: 3.5 mmol/L (ref 3.5–5.2)
Sodium: 140 mmol/L (ref 134–144)
Total Protein: 7.1 g/dL (ref 6.0–8.5)
eGFR: 54 mL/min/{1.73_m2} — ABNORMAL LOW (ref >59)

## 2020-08-27 LAB — CBC
Hematocrit: 42.6 % (ref 34.0–46.6)
Hemoglobin: 13.8 g/dL (ref 11.1–15.9)
MCH: 28.1 pg (ref 26.6–33.0)
MCHC: 32.4 g/dL (ref 31.5–35.7)
MCV: 87 fL (ref 79–97)
Platelets: 253 10*3/uL (ref 150–450)
RBC: 4.91 x10E6/uL (ref 3.77–5.28)
RDW: 13.6 % (ref 11.7–15.4)
WBC: 5.3 10*3/uL (ref 3.4–10.8)

## 2020-08-27 LAB — VITAMIN D 25 HYDROXY (VIT D DEFICIENCY, FRACTURES): Vit D, 25-Hydroxy: 51.8 ng/mL (ref 30.0–100.0)

## 2020-08-27 LAB — TSH: TSH: 3.76 u[IU]/mL (ref 0.450–4.500)

## 2020-09-02 ENCOUNTER — Other Ambulatory Visit: Payer: Self-pay

## 2020-09-02 ENCOUNTER — Ambulatory Visit (INDEPENDENT_AMBULATORY_CARE_PROVIDER_SITE_OTHER): Payer: Medicare HMO | Admitting: Family Medicine

## 2020-09-02 ENCOUNTER — Encounter: Payer: Self-pay | Admitting: Family Medicine

## 2020-09-02 VITALS — BP 120/82 | HR 90 | Temp 98.1°F | Resp 18 | Ht 67.0 in | Wt 317.0 lb

## 2020-09-02 DIAGNOSIS — R7303 Prediabetes: Secondary | ICD-10-CM

## 2020-09-02 DIAGNOSIS — Z13220 Encounter for screening for lipoid disorders: Secondary | ICD-10-CM

## 2020-09-02 DIAGNOSIS — I10 Essential (primary) hypertension: Secondary | ICD-10-CM

## 2020-09-02 DIAGNOSIS — E785 Hyperlipidemia, unspecified: Secondary | ICD-10-CM | POA: Diagnosis not present

## 2020-09-02 DIAGNOSIS — Z1231 Encounter for screening mammogram for malignant neoplasm of breast: Secondary | ICD-10-CM | POA: Diagnosis not present

## 2020-09-02 MED ORDER — ROSUVASTATIN CALCIUM 5 MG PO TABS: 5.0000 mg | ORAL_TABLET | Freq: Every day | ORAL | 3 refills | Status: DC

## 2020-09-02 NOTE — Assessment & Plan Note (Signed)
  Patient re-educated about  the importance of commitment to a  minimum of 150 minutes of exercise per week as able.  The importance of healthy food choices with portion control discussed, as well as eating regularly and within a 12 hour window most days. The need to choose "clean , green" food 50 to 75% of the time is discussed, as well as to make water the primary drink and set a goal of 64 ounces water daily.    Weight /BMI 09/02/2020 04/09/2020 12/04/2019  WEIGHT 317 lb 311 lb 313 lb  HEIGHT 5\' 7"  5\' 7"  5\' 6"   BMI 49.65 kg/m2 48.71 kg/m2 50.52 kg/m2

## 2020-09-02 NOTE — Assessment & Plan Note (Signed)
Hyperlipidemia:Low fat diet discussed and encouraged.   Lipid Panel  Lab Results  Component Value Date   CHOL 226 (H) 08/26/2020   HDL 38 (L) 08/26/2020   LDLCALC 160 (H) 08/26/2020   TRIG 155 (H) 08/26/2020   CHOLHDL 5.9 (H) 08/26/2020  start crestor 5 mg and reduce fat intake

## 2020-09-02 NOTE — Patient Instructions (Signed)
Annual exam first week in November, call if you need me before  Please schedule mammogram at checkout  Please work on changing food choice to improve cholesterol and weight  Please get covid booster as soon as possible  Please schedule shingrix vaccines at your pharmacy you need 2 doses  Fasting lipid, cmp and EGFR 10/20 or shortly after  New is crestor 5 mg daily  Think about what you will eat, plan ahead. Choose " clean, green, fresh or frozen" over canned, processed or packaged foods which are more sugary, salty and fatty. 70 to 75% of food eaten should be vegetables and fruit. Three meals at set times with snacks allowed between meals, but they must be fruit or vegetables. Aim to eat over a 12 hour period , example 7 am to 7 pm, and STOP after  your last meal of the day. Drink water,generally about 64 ounces per day, no other drink is as healthy. Fruit juice is best enjoyed in a healthy way, by EATING the fruit.   Thanks for choosing [facility], we consider it a privelige to serve you.

## 2020-09-02 NOTE — Progress Notes (Signed)
Michele Lowe     MRN: [id number]      DOB: 1951/01/07   HPI Michele Lowe is here for follow up and re-evaluation of chronic medical conditions, medication management and review of any available recent lab and radiology data.  Preventive health is updated, specifically  Cancer screening and Immunization.   Questions or concerns regarding consultations or procedures which the PT has had in the interim are  addressed. The PT denies any adverse reactions to current medications since the last visit.  There are no new concerns.  There are no specific complaints   ROS Denies recent fever or chills. Denies sinus pressure, nasal congestion, ear pain or sore throat. Denies chest congestion, productive cough or wheezing. Denies chest pains, palpitations and leg swelling Denies abdominal pain, nausea, vomiting,diarrhea or constipation.   Denies dysuria, frequency, hesitancy or incontinence. Denies joint pain, swelling and limitation in mobility. Denies headaches, seizures, numbness, or tingling. Denies depression, anxiety or insomnia. Denies skin break down or rash.   PE  BP 120/82   Pulse 90   Temp 98.1 F (36.7 C)   Resp 18   Ht 5\' 7"  (1.702 m)   Wt (!) 317 lb (143.8 kg)   SpO2 92%   BMI 49.65 kg/m   Patient alert and oriented and in no cardiopulmonary distress.  HEENT: No facial asymmetry, EOMI,     Neck supple .  Chest: Clear to auscultation bilaterally.  CVS: S1, S2 no murmurs, no S3.Regular rate.  ABD: Soft non tender.   Ext: No edema  MS: Adequate ROM spine, shoulders, hips and knees.  Skin: Intact, no ulcerations or rash noted.  Psych: Good eye contact, normal affect. Memory intact not anxious or depressed appearing.  CNS: CN 2-12 intact, power,  normal throughout.no focal deficits noted.   Assessment & Plan Essential hypertension Controlled, no change in medication DASH diet and commitment to daily physical activity for a minimum of 30 minutes  discussed and encouraged, as a part of hypertension management. The importance of attaining a healthy weight is also discussed.  BP/Weight 09/02/2020 04/09/2020 12/04/2019 09/11/2019 06/19/2019 11/27/2018 06/09/9561  Systolic BP 875 - 643 329 518 841 660  Diastolic BP 82 - 88 84 84 84 82  Wt. (Lbs) 317 311 313 323 323 323.04 317  BMI 49.65 48.71 50.52 52.13 52.13 50.6 51.17       MORBID OBESITY  Patient re-educated about  the importance of commitment to a  minimum of 150 minutes of exercise per week as able.  The importance of healthy food choices with portion control discussed, as well as eating regularly and within a 12 hour window most days. The need to choose "clean , green" food 50 to 75% of the time is discussed, as well as to make water the primary drink and set a goal of 64 ounces water daily.    Weight /BMI 09/02/2020 04/09/2020 12/04/2019  WEIGHT 317 lb 311 lb 313 lb  HEIGHT 5\' 7"  5\' 7"  5\' 6"   BMI 49.65 kg/m2 48.71 kg/m2 50.52 kg/m2      Hyperlipidemia LDL goal <100 Hyperlipidemia:Low fat diet discussed and encouraged.   Lipid Panel  Lab Results  Component Value Date   CHOL 226 (H) 08/26/2020   HDL 38 (L) 08/26/2020   LDLCALC 160 (H) 08/26/2020   TRIG 155 (H) 08/26/2020   CHOLHDL 5.9 (H) 08/26/2020  start crestor 5 mg and reduce fat intake     Prediabetes Patient educated about the importance of  limiting  Carbohydrate intake , the need to commit to daily physical activity for a minimum of 30 minutes , and to commit weight loss. The fact that changes in all these areas will reduce or eliminate all together the development of diabetes is stressed.  Updated lab needed at/ before next visit.   Diabetic Labs Latest Ref Rng & Units 08/26/2020 12/04/2019 07/12/2019 11/23/2018 02/16/2018  HbA1c <5.7 % of total Hgb - - 5.3 - 5.2  Chol 100 - 199 mg/dL 226(H) - 188 178 -  HDL >39 mg/dL 38(L) - 43(L) 52 -  Calc LDL 0 - 99 mg/dL 160(H) - 124(H) 106(H) -  Triglycerides 0 - 149  mg/dL 155(H) - 103 102 -  Creatinine 0.57 - 1.00 mg/dL 1.10(H) 0.95(H) 0.86 0.93 0.89   BP/Weight 09/02/2020 04/09/2020 12/04/2019 09/11/2019 06/19/2019 11/27/2018 8/33/5825  Systolic BP 189 - 842 103 128 118 867  Diastolic BP 82 - 88 84 84 84 82  Wt. (Lbs) 317 311 313 323 323 323.04 317  BMI 49.65 48.71 50.52 52.13 52.13 50.6 51.17   No flowsheet data found.

## 2020-09-02 NOTE — Assessment & Plan Note (Signed)
Patient educated about the importance of limiting  Carbohydrate intake , the need to commit to daily physical activity for a minimum of 30 minutes , and to commit weight loss. The fact that changes in all these areas will reduce or eliminate all together the development of diabetes is stressed.  Updated lab needed at/ before next visit.   Diabetic Labs Latest Ref Rng & Units 08/26/2020 12/04/2019 07/12/2019 11/23/2018 02/16/2018  HbA1c <5.7 % of total Hgb - - 5.3 - 5.2  Chol 100 - 199 mg/dL 226(H) - 188 178 -  HDL >39 mg/dL 38(L) - 43(L) 52 -  Calc LDL 0 - 99 mg/dL 160(H) - 124(H) 106(H) -  Triglycerides 0 - 149 mg/dL 155(H) - 103 102 -  Creatinine 0.57 - 1.00 mg/dL 1.10(H) 0.95(H) 0.86 0.93 0.89   BP/Weight 09/02/2020 04/09/2020 12/04/2019 09/11/2019 06/19/2019 11/27/2018 4/80/1655  Systolic BP 374 - 827 078 675 449 201  Diastolic BP 82 - 88 84 84 84 82  Wt. (Lbs) 317 311 313 323 323 323.04 317  BMI 49.65 48.71 50.52 52.13 52.13 50.6 51.17   No flowsheet data found.

## 2020-09-02 NOTE — Assessment & Plan Note (Signed)
Controlled, no change in medication DASH diet and commitment to daily physical activity for a minimum of 30 minutes discussed and encouraged, as a part of hypertension management. The importance of attaining a healthy weight is also discussed.  BP/Weight 09/02/2020 04/09/2020 12/04/2019 09/11/2019 06/19/2019 11/27/2018 6/76/7209  Systolic BP 470 - 962 836 629 476 546  Diastolic BP 82 - 88 84 84 84 82  Wt. (Lbs) 317 311 313 323 323 323.04 317  BMI 49.65 48.71 50.52 52.13 52.13 50.6 51.17

## 2020-09-03 ENCOUNTER — Telehealth: Payer: Self-pay | Admitting: Family Medicine

## 2020-09-03 ENCOUNTER — Other Ambulatory Visit: Payer: Self-pay

## 2020-09-03 MED ORDER — POTASSIUM CHLORIDE CRYS ER 20 MEQ PO TBCR: 20.0000 meq | EXTENDED_RELEASE_TABLET | Freq: Every day | ORAL | 1 refills | Status: DC

## 2020-09-03 MED ORDER — FUROSEMIDE 40 MG PO TABS: 40.0000 mg | ORAL_TABLET | Freq: Every day | ORAL | 1 refills | Status: DC

## 2020-09-03 MED ORDER — TRIAMTERENE-HCTZ 37.5-25 MG PO TABS: ORAL_TABLET | ORAL | 1 refills | Status: DC

## 2020-09-03 NOTE — Telephone Encounter (Signed)
Please send medication from [facility] and all others to [facility], except the one sent to [facility] she will pick that one up

## 2020-09-03 NOTE — Telephone Encounter (Signed)
Pt aware that all meds were sent to [facility]

## 2020-09-12 ENCOUNTER — Ambulatory Visit (INDEPENDENT_AMBULATORY_CARE_PROVIDER_SITE_OTHER): Payer: Medicare HMO

## 2020-09-12 ENCOUNTER — Other Ambulatory Visit: Payer: Self-pay

## 2020-09-12 DIAGNOSIS — Z0000 Encounter for general adult medical examination without abnormal findings: Secondary | ICD-10-CM

## 2020-09-12 NOTE — Progress Notes (Signed)
Subjective:   VADIE PEARL is a 70 y.o. female who presents for Medicare Annual (Subsequent) preventive examination.         Objective:    There were no vitals filed for this visit. There is no height or weight on file to calculate BMI.  Advanced Directives 09/05/2017 09/01/2016  Does Patient Have a Medical Advance Directive? No No  Would patient like information on creating a medical advance directive? Yes (ED - Information included in AVS) No - Patient declined    Current Medications (verified) Outpatient Encounter Medications as of 09/12/2020  Medication Sig   calcium-vitamin D (OSCAL WITH D) 500-200 MG-UNIT tablet Take 1 tablet by mouth 2 (two) times daily.   cholecalciferol (VITAMIN D3) 25 MCG (1000 UNIT) tablet Take 1,000 Units by mouth daily.   furosemide (LASIX) 40 MG tablet Take 1 tablet (40 mg total) by mouth daily.   Glucosamine-Chondroit-Vit C-Mn (GLUCOSAMINE 1500 COMPLEX PO) Take 1 capsule by mouth daily.   loratadine (CLARITIN) 10 MG tablet Take 1 tablet (10 mg total) by mouth daily.   Multiple Vitamin (MULTIVITAMIN PO) Take by mouth daily.   OVER THE COUNTER MEDICATION Curamin take 2 in the am and 1 at bedtime   potassium chloride SA (KLOR-CON) 20 MEQ tablet Take 1 tablet (20 mEq total) by mouth daily.   rosuvastatin (CRESTOR) 5 MG tablet Take 1 tablet (5 mg total) by mouth daily.   triamterene-hydrochlorothiazide (MAXZIDE-25) 37.5-25 MG tablet TAKE 1 AND 1/2 TABLETS ONE TIME DAILY   Turmeric 500 MG CAPS Take 2 each by mouth daily.    No facility-administered encounter medications on file as of 09/12/2020.    Allergies (verified) Codeine, Influenza vaccines, and Oxycodone hcl   History: Past Medical History:  Diagnosis Date   Allergy    Annual physical exam 08/04/2015   Arthritis    Cancer ([place]) 06/20/2014   MELANOMA, LEFT  LOWER ARM   Educated about COVID-19 virus infection 07/01/2018   Essential hypertension, benign 2009   Hypertension    Phreesia  09/09/2019   KNEE PAIN, RIGHT 09/13/2008   Qualifier: Diagnosis of  By: Cori Razor LPN, Brandi   Qualifier: Diagnosis of  By: Moshe Cipro MD, Margaret     Metabolic syndrome X [id number]   Morbid obesity ([place])    Need for influenza vaccination 11/27/2018   Rhinosinusitis    Past Surgical History:  Procedure Laterality Date   ABDOMINAL HYSTERECTOMY     Partial    JOINT REPLACEMENT  05/13/2011   Revision of left knee replacement in 2008   [facility]  06/20/2014   Dr Harriet Masson   Partial hysterectomy ,secondary to fibroid . Pt still have both ovaries  1978   Rt. forearm surgery for nerve repair  1989   TOTAL KNEE ARTHROPLASTY  2007   Right    Family History  Problem Relation Age of Onset   Heart failure Mother    Hypertension Mother    Heart disease Mother        Irregular heart valve   Hypertension Father    Emphysema Father    Heart disease Father    COPD Father    Diabetes Brother    Heart failure Brother    Diabetes Brother    Diabetes Brother    Hyperlipidemia Brother    Hypertension Sister    Hypertension Sister    Social History   Socioeconomic History   Marital status: Divorced    Spouse name: Not on file   Number  of children: 3   Years of education: Not on file   Highest education level: Not on file  Occupational History   Occupation: Disabled   Tobacco Use   Smoking status: Never   Smokeless tobacco: Never  Substance and Sexual Activity   Alcohol use: No   Drug use: No   Sexual activity: Not Currently  Other Topics Concern   Not on file  Social History Narrative   Not on file   Social Determinants of Health   Financial Resource Strain: Not on file  Food Insecurity: Not on file  Transportation Needs: Not on file  Physical Activity: Not on file  Stress: Not on file  Social Connections: Not on file    Tobacco Counseling Counseling given: Not Answered   Clinical Intake:                 Diabetic? No          Activities of Daily  Living No flowsheet data found.  Patient Care Team: Fayrene Helper, MD as PCP - General Devonne Doughty, MD as Referring Physician (Orthopedic Surgery) Satira Sark, MD as Consulting Physician (Cardiology) Garrel Ridgel, DPM as Consulting Physician (Podiatry)  Indicate any recent Medical Services you may have received from other than [facility] providers in the past year (date may be approximate).     Assessment:   This is a routine wellness examination for [place].  Hearing/Vision screen No results found.  Dietary issues and exercise activities discussed:     Goals Addressed   None   Depression Screen PHQ 2/9 Scores 09/02/2020 04/09/2020 09/11/2019 11/27/2018 09/07/2018 02/16/2018 09/05/2017  PHQ - 2 Score 0 0 0 0 0 0 0  PHQ- 9 Score - - 0 - - - -    Fall Risk Fall Risk  09/02/2020 04/09/2020 04/09/2020 12/04/2019 09/11/2019  Falls in the past year? 0 0 0 0 0  Number falls in past yr: 0 - 0 0 0  Injury with Fall? 0 - 0 0 0  Risk for fall due to : No Fall Risks - - - No Fall Risks  Follow up Falls evaluation completed - - - Falls evaluation completed    FALL RISK PREVENTION PERTAINING TO THE HOME:  Any stairs in or around the home? Yes  If so, are there any without handrails? No  Home free of loose throw rugs in walkways, pet beds, electrical cords, etc? Yes  Adequate lighting in your home to reduce risk of falls? Yes   ASSISTIVE DEVICES UTILIZED TO PREVENT FALLS:  Life alert? No  Use of a cane, walker or w/c? No  Grab bars in the bathroom? No  Shower chair or bench in shower? No  Elevated toilet seat or a handicapped toilet? No   TIMED UP AND GO:  Was the test performed? No .    Cognitive Function:     6CIT Screen 09/11/2019 09/07/2018 09/05/2017  What Year? 0 points 0 points 0 points  What month? 0 points 0 points 0 points  What time? 0 points 0 points 0 points  Count back from 20 0 points 0 points 0 points  Months in reverse 0 points 0 points 0 points   Repeat phrase 4 points 0 points 6 points  Total Score 4 0 6    Immunizations Immunization History  Administered Date(s) Administered   Fluad Quad(high Dose 65+) 11/27/2018, 12/04/2019   Influenza Whole 11/23/2006   Moderna Sars-Covid-2 Vaccination 05/15/2019, 06/15/2019, 02/26/2020  Pneumococcal Conjugate-13 10/31/2013   Pneumococcal Polysaccharide-23 08/04/2015   Td 11/23/2006   Zoster, Live 02/13/2009    TDAP status: Up to date  Flu Vaccine status: Up to date  Pneumococcal vaccine status: Up to date  Covid-19 vaccine status: Completed vaccines  Qualifies for Shingles Vaccine? Yes   Zostavax completed Yes   Shingrix Completed?: No.    Education has been provided regarding the importance of this vaccine. Patient has been advised to call insurance company to determine out of pocket expense if they have not yet received this vaccine. Advised may also receive vaccine at local pharmacy or Health Dept. Verbalized acceptance and understanding.  Screening Tests Health Maintenance  Topic Date Due   Zoster Vaccines- Shingrix (1 of 2) Never done   COVID-19 Vaccine (4 - Booster for Moderna series) 06/25/2020   COLONOSCOPY (Pts 45-30yr Insurance coverage will need to be confirmed)  01/08/2021 (Originally 02/23/2016)   TETANUS/TDAP  04/08/2022 (Originally 11/22/2016)   INFLUENZA VACCINE  09/15/2020   MAMMOGRAM  01/29/2022   DEXA SCAN  Completed   Hepatitis C Screening  Completed   PNA vac Low Risk Adult  Completed   HPV VACCINES  Aged Out    Health Maintenance  Health Maintenance Due  Topic Date Due   Zoster Vaccines- Shingrix (1 of 2) Never done   COVID-19 Vaccine (4 - Booster for Moderna series) 06/25/2020   Colorectal Screening: Cologuard (negative) 08/31/2020   Mammogram status: Completed normal. Repeat every year - Dec. 19th 2022  Bone Density status: Completed normal. Results reflect: Bone density results: NORMAL. Repeat every 10 years.  Lung Cancer Screening: (Low  Dose CT Chest recommended if Age 72-80years, 30 pack-year currently smoking OR have quit w/in 15years.) does not qualify.     Additional Screening:  Hepatitis C Screening: does qualify; Completed.  Vision Screening: Recommended annual ophthalmology exams for early detection of glaucoma and other disorders of the eye. Is the patient up to date with their annual eye exam?  Yes   Who is the provider or what is the name of the office in which the patient attends annual eye exams? Referral sent  If pt is not established with a provider, would they like to be referred to a provider to establish care? Yes .   Dental Screening: Recommended annual dental exams for proper oral hygiene  Community Resource Referral / Chronic Care Management: CRR required this visit?  No   CCM required this visit?  No      Plan:     I have personally reviewed and noted the following in the patient's chart:   Medical and social history Use of alcohol, tobacco or illicit drugs  Current medications and supplements including opioid prescriptions.  Functional ability and status Nutritional status Physical activity Advanced directives List of other physicians Hospitalizations, surgeries, and ER visits in previous 12 months Vitals Screenings to include cognitive, depression, and falls Referrals and appointments  In addition, I have reviewed and discussed with patient certain preventive protocols, quality metrics, and best practice recommendations. A written personalized care plan for preventive services as well as general preventive health recommendations were provided to patient.     ALonn Georgia LPN   [id number]  Nurse Notes: AWV conducted over the phone with pt consent to televisit via audio. Pt was present in the home at the time of visit with provider working off site. This call took approx 25 min.

## 2020-09-12 NOTE — Patient Instructions (Signed)
Michele Lowe , Thank you for taking time to come for your Medicare Wellness Visit. I appreciate your ongoing commitment to your health goals. Please review the following plan we discussed and let me know if I can assist you in the future.   Screening recommendations/referrals: Colonoscopy: Pt up to date on Cologuard Screening - due 08/2023  Mammogram: Up to date, due 01/2021 (scheduled)  Bone Density: Complete  Recommended yearly ophthalmology/optometry visit for glaucoma screening and checkup Recommended yearly dental visit for hygiene and checkup  Vaccinations: Influenza vaccine: Due Fall 2022  Pneumococcal vaccine: Completed Tdap vaccine: Up to date, due 04/08/22  Shingles vaccine: Zostavax - complete, Shingrix - eligible (check with pharmacy for pricing)    Advanced directives: N/A   Conditions/risks identified: None at this time.   Next appointment: 12/16/2020 @ 8:20am with Dr. Moshe Cipro   Preventive Care 65 Years and Older, Female Preventive care refers to lifestyle choices and visits with your health care provider that can promote health and wellness. What does preventive care include? A yearly physical exam. This is also called an annual well check. Dental exams once or twice a year. Routine eye exams. Ask your health care provider how often you should have your eyes checked. Personal lifestyle choices, including: Daily care of your teeth and gums. Regular physical activity. Eating a healthy diet. Avoiding tobacco and drug use. Limiting alcohol use. Practicing safe sex. Taking low-dose aspirin every day. Taking vitamin and mineral supplements as recommended by your health care provider. What happens during an annual well check? The services and screenings done by your health care provider during your annual well check will depend on your age, overall health, lifestyle risk factors, and family history of disease. Counseling  Your health care provider may ask you questions  about your: Alcohol use. Tobacco use. Drug use. Emotional well-being. Home and relationship well-being. Sexual activity. Eating habits. History of falls. Memory and ability to understand (cognition). Work and work Statistician. Reproductive health. Screening  You may have the following tests or measurements: Height, weight, and BMI. Blood pressure. Lipid and cholesterol levels. These may be checked every 5 years, or more frequently if you are over 53 years old. Skin check. Lung cancer screening. You may have this screening every year starting at age 8 if you have a 30-pack-year history of smoking and currently smoke or have quit within the past 15 years. Fecal occult blood test (FOBT) of the stool. You may have this test every year starting at age 8. Flexible sigmoidoscopy or colonoscopy. You may have a sigmoidoscopy every 5 years or a colonoscopy every 10 years starting at age 33. Hepatitis C blood test. Hepatitis B blood test. Sexually transmitted disease (STD) testing. Diabetes screening. This is done by checking your blood sugar (glucose) after you have not eaten for a while (fasting). You may have this done every 1-3 years. Bone density scan. This is done to screen for osteoporosis. You may have this done starting at age 67. Mammogram. This may be done every 1-2 years. Talk to your health care provider about how often you should have regular mammograms. Talk with your health care provider about your test results, treatment options, and if necessary, the need for more tests. Vaccines  Your health care provider may recommend certain vaccines, such as: Influenza vaccine. This is recommended every year. Tetanus, diphtheria, and acellular pertussis (Tdap, Td) vaccine. You may need a Td booster every 10 years. Zoster vaccine. You may need this after age 30. Pneumococcal  13-valent conjugate (PCV13) vaccine. One dose is recommended after age 7. Pneumococcal polysaccharide (PPSV23)  vaccine. One dose is recommended after age 10. Talk to your health care provider about which screenings and vaccines you need and how often you need them. This information is not intended to replace advice given to you by your health care provider. Make sure you discuss any questions you have with your health care provider. Document Released: 02/28/2015 Document Revised: 10/22/2015 Document Reviewed: 12/03/2014 Elsevier Interactive Patient Education  2017 [employer] Prevention in the Home Falls can cause injuries. They can happen to people of all ages. There are many things you can do to make your home safe and to help prevent falls. What can I do on the outside of my home? Regularly fix the edges of walkways and driveways and fix any cracks. Remove anything that might make you trip as you walk through a door, such as a raised step or threshold. Trim any bushes or trees on the path to your home. Use bright outdoor lighting. Clear any walking paths of anything that might make someone trip, such as rocks or tools. Regularly check to see if handrails are loose or broken. Make sure that both sides of any steps have handrails. Any raised decks and porches should have guardrails on the edges. Have any leaves, snow, or ice cleared regularly. Use sand or salt on walking paths during winter. Clean up any spills in your garage right away. This includes oil or grease spills. What can I do in the bathroom? Use night lights. Install grab bars by the toilet and in the tub and shower. Do not use towel bars as grab bars. Use non-skid mats or decals in the tub or shower. If you need to sit down in the shower, use a plastic, non-slip stool. Keep the floor dry. Clean up any water that spills on the floor as soon as it happens. Remove soap buildup in the tub or shower regularly. Attach bath mats securely with double-sided non-slip rug tape. Do not have throw rugs and other things on the floor that  can make you trip. What can I do in the bedroom? Use night lights. Make sure that you have a light by your bed that is easy to reach. Do not use any sheets or blankets that are too big for your bed. They should not hang down onto the floor. Have a firm chair that has side arms. You can use this for support while you get dressed. Do not have throw rugs and other things on the floor that can make you trip. What can I do in the kitchen? Clean up any spills right away. Avoid walking on wet floors. Keep items that you use a lot in easy-to-reach places. If you need to reach something above you, use a strong step stool that has a grab bar. Keep electrical cords out of the way. Do not use floor polish or wax that makes floors slippery. If you must use wax, use non-skid floor wax. Do not have throw rugs and other things on the floor that can make you trip. What can I do with my stairs? Do not leave any items on the stairs. Make sure that there are handrails on both sides of the stairs and use them. Fix handrails that are broken or loose. Make sure that handrails are as long as the stairways. Check any carpeting to make sure that it is firmly attached to the stairs. Fix any carpet  that is loose or worn. Avoid having throw rugs at the top or bottom of the stairs. If you do have throw rugs, attach them to the floor with carpet tape. Make sure that you have a light switch at the top of the stairs and the bottom of the stairs. If you do not have them, ask someone to add them for you. What else can I do to help prevent falls? Wear shoes that: Do not have high heels. Have rubber bottoms. Are comfortable and fit you well. Are closed at the toe. Do not wear sandals. If you use a stepladder: Make sure that it is fully opened. Do not climb a closed stepladder. Make sure that both sides of the stepladder are locked into place. Ask someone to hold it for you, if possible. Clearly mark and make sure that you  can see: Any grab bars or handrails. First and last steps. Where the edge of each step is. Use tools that help you move around (mobility aids) if they are needed. These include: Canes. Walkers. Scooters. Crutches. Turn on the lights when you go into a dark area. Replace any light bulbs as soon as they burn out. Set up your furniture so you have a clear path. Avoid moving your furniture around. If any of your floors are uneven, fix them. If there are any pets around you, be aware of where they are. Review your medicines with your doctor. Some medicines can make you feel dizzy. This can increase your chance of falling. Ask your doctor what other things that you can do to help prevent falls. This information is not intended to replace advice given to you by your health care provider. Make sure you discuss any questions you have with your health care provider. Document Released: 11/28/2008 Document Revised: 07/10/2015 Document Reviewed: 03/08/2014 Elsevier Interactive Patient Education  2017 [employer].

## 2020-09-23 ENCOUNTER — Telehealth: Payer: Self-pay | Admitting: Family Medicine

## 2020-09-23 NOTE — Telephone Encounter (Signed)
Pt informed

## 2020-09-23 NOTE — Telephone Encounter (Signed)
Pt is calling states that she is sluggish, and she thinks it is from the Cholestorol medication

## 2020-09-23 NOTE — Telephone Encounter (Signed)
Pt says that she started taking the new cholesterol medication on 09/12/20. Last Wednesday 09/17/20 she started feeling odd. She said it's hard to explain but she just does not feel like herself. She said she is very fatigued and that is not like her. No fever, she is eating/drinking fine, just does not have no energy at all. She says she thinks it's related to that new medication and wants to know what you advise.

## 2020-09-23 NOTE — Telephone Encounter (Signed)
Stop the medicine and see if she feels better.

## 2020-11-25 DIAGNOSIS — T8484XD Pain due to internal orthopedic prosthetic devices, implants and grafts, subsequent encounter: Secondary | ICD-10-CM | POA: Diagnosis not present

## 2020-11-25 DIAGNOSIS — M1711 Unilateral primary osteoarthritis, right knee: Secondary | ICD-10-CM | POA: Diagnosis not present

## 2020-11-25 DIAGNOSIS — Z96653 Presence of artificial knee joint, bilateral: Secondary | ICD-10-CM | POA: Diagnosis not present

## 2020-11-25 DIAGNOSIS — Z471 Aftercare following joint replacement surgery: Secondary | ICD-10-CM | POA: Diagnosis not present

## 2020-11-25 DIAGNOSIS — M1712 Unilateral primary osteoarthritis, left knee: Secondary | ICD-10-CM | POA: Diagnosis not present

## 2020-11-25 DIAGNOSIS — Z96652 Presence of left artificial knee joint: Secondary | ICD-10-CM | POA: Diagnosis not present

## 2020-12-16 ENCOUNTER — Ambulatory Visit (INDEPENDENT_AMBULATORY_CARE_PROVIDER_SITE_OTHER): Payer: Medicare HMO | Admitting: Family Medicine

## 2020-12-16 ENCOUNTER — Encounter: Payer: Self-pay | Admitting: Family Medicine

## 2020-12-16 ENCOUNTER — Other Ambulatory Visit: Payer: Self-pay

## 2020-12-16 VITALS — BP 120/88 | HR 84 | Resp 16 | Ht 67.0 in | Wt 296.0 lb

## 2020-12-16 DIAGNOSIS — E785 Hyperlipidemia, unspecified: Secondary | ICD-10-CM

## 2020-12-16 DIAGNOSIS — I10 Essential (primary) hypertension: Secondary | ICD-10-CM

## 2020-12-16 DIAGNOSIS — H547 Unspecified visual loss: Secondary | ICD-10-CM | POA: Diagnosis not present

## 2020-12-16 DIAGNOSIS — Z23 Encounter for immunization: Secondary | ICD-10-CM | POA: Diagnosis not present

## 2020-12-16 DIAGNOSIS — Z0000 Encounter for general adult medical examination without abnormal findings: Secondary | ICD-10-CM

## 2020-12-16 NOTE — Assessment & Plan Note (Signed)
Refer to opthalmology. 

## 2020-12-16 NOTE — Assessment & Plan Note (Signed)
  Patient re-educated about  the importance of commitment to a  minimum of 150 minutes of exercise per week as able.  The importance of healthy food choices with portion control discussed, as well as eating regularly and within a 12 hour window most days. The need to choose "clean , green" food 50 to 75% of the time is discussed, as well as to make water the primary drink and set a goal of 64 ounces water daily.    Weight /BMI 12/16/2020 09/02/2020 04/09/2020  WEIGHT 296 lb 317 lb 311 lb  HEIGHT 5\' 7"  5\' 7"  5\' 7"   BMI 46.36 kg/m2 49.65 kg/m2 48.71 kg/m2  Improved , which is excellent

## 2020-12-16 NOTE — Assessment & Plan Note (Signed)

## 2020-12-16 NOTE — Progress Notes (Signed)
    Michele Lowe     MRN: [id number]      DOB: [birth year]  HPI: Patient is in for annual physical exam. No other health concerns are expressed or addressed at the visit.  Immunization is reviewed , and  updated    PE: BP 120/88   Pulse 84   Resp 16   Ht 5\' 7"  (1.702 m)   Wt 296 lb (134.3 kg)   SpO2 94%   BMI 46.36 kg/m   Pleasant  female, alert and oriented x 3, in no cardio-pulmonary distress. Afebrile. HEENT No facial trauma or asymetry. Sinuses non tender.  Extra occullar muscles intact.. External ears normal, . Neck: supple, no adenopathy,JVD or thyromegaly.No bruits.  Chest: Clear to ascultation bilaterally.No crackles or wheezes. Non tender to palpation  Breast: No asymetry,no masses or lumps. No tenderness. No nipple discharge or inversion. No axillary or supraclavicular adenopathy  Cardiovascular system; Heart sounds normal,  S1 and  S2 ,no S3.  No murmur, or thrill. Apical beat not displaced Peripheral pulses normal.  Abdomen: Soft, non tender, no organomegaly or masses. No bruits. Bowel sounds normal. No guarding, tenderness or rebound.     Musculoskeletal exam: Full ROM of spine, hips , shoulders and reduced in knees No muscle wasting or atrophy.   Neurologic: Cranial nerves 2 to 12 intact. Power, tone ,sensation s normal throughout. disturbance in gait. No tremor.  Skin: Intact, hyperpigmentation of anterior shins   Psych; Normal mood and affect. Judgement and concentration normal   Assessment & Plan:  Annual physical exam Annual exam as documented. Counseling done  re healthy lifestyle involving commitment to 150 minutes exercise per week, heart healthy diet, and attaining healthy weight.The importance of adequate sleep also discussed. Regular seat belt use and home safety, is also discussed. Changes in health habits are decided on by the patient with goals and time frames  set for achieving them. Immunization and cancer  screening needs are specifically addressed at this visit.   Reduced vision Refer to opthalmology  MORBID OBESITY  Patient re-educated about  the importance of commitment to a  minimum of 150 minutes of exercise per week as able.  The importance of healthy food choices with portion control discussed, as well as eating regularly and within a 12 hour window most days. The need to choose "clean , green" food 50 to 75% of the time is discussed, as well as to make water the primary drink and set a goal of 64 ounces water daily.    Weight /BMI 12/16/2020 09/02/2020 04/09/2020  WEIGHT 296 lb 317 lb 311 lb  HEIGHT 5\' 7"  5\' 7"  5\' 7"   BMI 46.36 kg/m2 49.65 kg/m2 48.71 kg/m2  Improved , which is excellent

## 2020-12-16 NOTE — Patient Instructions (Signed)
Follow-up in 4 months in office call if you need me sooner.  Flu vaccine in office today.  Please get your COVID and Shingrix vaccines at your local pharmacy.  Nurse please send for most recent Cologuard and enter in patient record.  She states she had this done this year.  Labs today lipid panel Chem-7 and EGFR.  You are referred for eye exam.  Congrats on excellent weight loss keep this up.  It is important that you exercise regularly at least 30 minutes 5 times a week. If you develop chest pain, have severe difficulty breathing, or feel very tired, stop exercising immediately and seek medical attention   Think about what you will eat, plan ahead. Choose " clean, green, fresh or frozen" over canned, processed or packaged foods which are more sugary, salty and fatty. 70 to 75% of food eaten should be vegetables and fruit. Three meals at set times with snacks allowed between meals, but they must be fruit or vegetables. Aim to eat over a 12 hour period , example 7 am to 7 pm, and STOP after  your last meal of the day. Drink water,generally about 64 ounces per day, no other drink is as healthy. Fruit juice is best enjoyed in a healthy way, by EATING the fruit.

## 2020-12-17 ENCOUNTER — Telehealth: Payer: Self-pay

## 2020-12-17 ENCOUNTER — Other Ambulatory Visit: Payer: Self-pay

## 2020-12-17 ENCOUNTER — Other Ambulatory Visit: Payer: Self-pay | Admitting: Family Medicine

## 2020-12-17 DIAGNOSIS — E785 Hyperlipidemia, unspecified: Secondary | ICD-10-CM

## 2020-12-17 DIAGNOSIS — R7303 Prediabetes: Secondary | ICD-10-CM

## 2020-12-17 DIAGNOSIS — I10 Essential (primary) hypertension: Secondary | ICD-10-CM

## 2020-12-17 LAB — BMP8+EGFR
BUN/Creatinine Ratio: 17 (ref 12–28)
BUN: 18 mg/dL (ref 8–27)
CO2: 27 mmol/L (ref 20–29)
Calcium: 9.9 mg/dL (ref 8.7–10.3)
Chloride: 96 mmol/L (ref 96–106)
Creatinine, Ser: 1.06 mg/dL — ABNORMAL HIGH (ref 0.57–1.00)
Glucose: 107 mg/dL — ABNORMAL HIGH (ref 70–99)
Potassium: 3.6 mmol/L (ref 3.5–5.2)
Sodium: 141 mmol/L (ref 134–144)
eGFR: 56 mL/min/{1.73_m2} — ABNORMAL LOW (ref >59)

## 2020-12-17 LAB — LIPID PANEL
Chol/HDL Ratio: 5.3 ratio — ABNORMAL HIGH (ref 0.0–4.4)
Cholesterol, Total: 205 mg/dL — ABNORMAL HIGH (ref 100–199)
HDL: 39 mg/dL — ABNORMAL LOW (ref >39)
LDL Chol Calc (NIH): 136 mg/dL — ABNORMAL HIGH (ref 0–99)
Triglycerides: 169 mg/dL — ABNORMAL HIGH (ref 0–149)
VLDL Cholesterol Cal: 30 mg/dL (ref 5–40)

## 2020-12-17 NOTE — Progress Notes (Signed)
Start 3 times weekly crestor based on lab of 12/16/2020

## 2020-12-17 NOTE — Telephone Encounter (Signed)
Patient returning nurse call, call cell phone [phone]

## 2021-01-07 ENCOUNTER — Other Ambulatory Visit: Payer: Self-pay | Admitting: Family Medicine

## 2021-01-07 ENCOUNTER — Other Ambulatory Visit: Payer: Self-pay

## 2021-01-07 ENCOUNTER — Ambulatory Visit: Payer: Medicare HMO

## 2021-01-07 ENCOUNTER — Telehealth: Payer: Self-pay

## 2021-01-07 DIAGNOSIS — R7303 Prediabetes: Secondary | ICD-10-CM

## 2021-01-07 DIAGNOSIS — R634 Abnormal weight loss: Secondary | ICD-10-CM

## 2021-01-07 NOTE — Telephone Encounter (Signed)
Pt came In office wanting to know if she could get a second opinion on her bloodwork results.  She states that she lost about 60 pounds and now is in worse shape medically than before.  What is the next step she should take?

## 2021-01-07 NOTE — Telephone Encounter (Signed)
Pt will come in Monday for A1c in lab

## 2021-01-12 DIAGNOSIS — I10 Essential (primary) hypertension: Secondary | ICD-10-CM | POA: Diagnosis not present

## 2021-01-12 DIAGNOSIS — E785 Hyperlipidemia, unspecified: Secondary | ICD-10-CM | POA: Diagnosis not present

## 2021-01-12 DIAGNOSIS — R7303 Prediabetes: Secondary | ICD-10-CM | POA: Diagnosis not present

## 2021-01-13 ENCOUNTER — Encounter: Payer: Self-pay | Admitting: Internal Medicine

## 2021-01-13 LAB — CMP14+EGFR
ALT: 17 IU/L (ref 0–32)
AST: 23 IU/L (ref 0–40)
Albumin/Globulin Ratio: 1.6 (ref 1.2–2.2)
Albumin: 4.2 g/dL (ref 3.7–4.7)
Alkaline Phosphatase: 104 IU/L (ref 44–121)
BUN/Creatinine Ratio: 14 (ref 12–28)
BUN: 13 mg/dL (ref 8–27)
Bilirubin Total: 0.3 mg/dL (ref 0.0–1.2)
CO2: 27 mmol/L (ref 20–29)
Calcium: 9.2 mg/dL (ref 8.7–10.3)
Chloride: 100 mmol/L (ref 96–106)
Creatinine, Ser: 0.9 mg/dL (ref 0.57–1.00)
Globulin, Total: 2.6 g/dL (ref 1.5–4.5)
Glucose: 94 mg/dL (ref 70–99)
Potassium: 3.5 mmol/L (ref 3.5–5.2)
Sodium: 142 mmol/L (ref 134–144)
Total Protein: 6.8 g/dL (ref 6.0–8.5)
eGFR: 68 mL/min/{1.73_m2} (ref >59)

## 2021-01-13 LAB — LIPID PANEL
Chol/HDL Ratio: 3.8 ratio (ref 0.0–4.4)
Cholesterol, Total: 155 mg/dL (ref 100–199)
HDL: 41 mg/dL (ref >39)
LDL Chol Calc (NIH): 91 mg/dL (ref 0–99)
Triglycerides: 128 mg/dL (ref 0–149)
VLDL Cholesterol Cal: 23 mg/dL (ref 5–40)

## 2021-01-13 LAB — HEMOGLOBIN A1C
Est. average glucose Bld gHb Est-mCnc: 105 mg/dL
Hgb A1c MFr Bld: 5.3 % (ref 4.8–5.6)

## 2021-01-14 ENCOUNTER — Other Ambulatory Visit: Payer: Self-pay | Admitting: Family Medicine

## 2021-02-02 ENCOUNTER — Other Ambulatory Visit: Payer: Self-pay

## 2021-02-02 ENCOUNTER — Ambulatory Visit (HOSPITAL_COMMUNITY)
Admission: RE | Admit: 2021-02-02 | Discharge: 2021-02-02 | Disposition: A | Discharge status: Home / Self Care | Payer: Medicare HMO | Source: Ambulatory Visit | Attending: Family Medicine | Admitting: Family Medicine

## 2021-02-02 ENCOUNTER — Other Ambulatory Visit (HOSPITAL_COMMUNITY): Payer: Self-pay | Admitting: Family Medicine

## 2021-02-02 DIAGNOSIS — R928 Other abnormal and inconclusive findings on diagnostic imaging of breast: Secondary | ICD-10-CM

## 2021-02-02 DIAGNOSIS — Z1231 Encounter for screening mammogram for malignant neoplasm of breast: Secondary | ICD-10-CM | POA: Diagnosis not present

## 2021-02-10 ENCOUNTER — Ambulatory Visit (HOSPITAL_COMMUNITY)
Admission: RE | Admit: 2021-02-10 | Discharge: 2021-02-10 | Disposition: A | Discharge status: Home / Self Care | Payer: Medicare HMO | Source: Ambulatory Visit | Attending: Family Medicine | Admitting: Family Medicine

## 2021-02-10 ENCOUNTER — Other Ambulatory Visit: Payer: Self-pay

## 2021-02-10 DIAGNOSIS — N6489 Other specified disorders of breast: Secondary | ICD-10-CM | POA: Diagnosis not present

## 2021-02-10 DIAGNOSIS — R928 Other abnormal and inconclusive findings on diagnostic imaging of breast: Secondary | ICD-10-CM | POA: Insufficient documentation

## 2021-02-23 ENCOUNTER — Ambulatory Visit: Payer: Medicare HMO | Admitting: Nutrition

## 2021-02-28 NOTE — Progress Notes (Deleted)
Referring Provider: Fayrene Helper, MD Primary Care Physician:  Fayrene Helper, MD Primary Gastroenterologist:  Dr. Abbey Chatters  No chief complaint on file.   HPI:   Michele Lowe is a 71 y.o. female presenting today at the request of Fayrene Helper, MD for unintentional weight loss.  Past Medical History:  Diagnosis Date   Allergy    Annual physical exam 08/04/2015   Arthritis    Cancer ([place]) 06/20/2014   MELANOMA, LEFT  LOWER ARM   Educated about COVID-19 virus infection 07/01/2018   Essential hypertension, benign 2009   Hypertension    Phreesia 09/09/2019   KNEE PAIN, RIGHT 09/13/2008   Qualifier: Diagnosis of  By: Cori Razor LPN, Brandi   Qualifier: Diagnosis of  By: Moshe Cipro MD, Margaret     Metabolic syndrome X 02/20/1094   Morbid obesity ([place])    Need for influenza vaccination 11/27/2018   Rhinosinusitis     Past Surgical History:  Procedure Laterality Date   ABDOMINAL HYSTERECTOMY     Partial    JOINT REPLACEMENT  05/13/2011   Revision of left knee replacement in 2008   [place]  06/20/2014   Dr Harriet Masson   Partial hysterectomy ,secondary to fibroid . Pt still have both ovaries  1978   Rt. forearm surgery for nerve repair  [place]  2007   Right     Current Outpatient Medications  Medication Sig Dispense Refill   calcium-vitamin D (OSCAL WITH D) 500-200 MG-UNIT tablet Take 1 tablet by mouth 2 (two) times daily. 180 tablet 3   cholecalciferol (VITAMIN D3) 25 MCG (1000 UNIT) tablet Take 1,000 Units by mouth daily.     furosemide (LASIX) 40 MG tablet Take 1 tablet (40 mg total) by mouth daily. 90 tablet 1   Glucosamine-Chondroit-Vit C-Mn (GLUCOSAMINE 1500 COMPLEX PO) Take 1 capsule by mouth daily.     loratadine (CLARITIN) 10 MG tablet Take 1 tablet (10 mg total) by mouth daily. 90 tablet 1   Multiple Vitamin (MULTIVITAMIN PO) Take by mouth daily.     OVER THE COUNTER MEDICATION Curamin take 2 in the am and 1 at bedtime      potassium chloride SA (KLOR-CON) 20 MEQ tablet Take 1 tablet (20 mEq total) by mouth daily. 90 tablet 1   rosuvastatin (CRESTOR) 5 MG tablet Take one tablet every Mon, Wed, Friday 90 tablet 3   triamterene-hydrochlorothiazide (MAXZIDE-25) 37.5-25 MG tablet TAKE 1 AND 1/2 TABLETS ONE TIME DAILY 135 tablet 1   Turmeric 500 MG CAPS Take 2 each by mouth daily.      No current facility-administered medications for this visit.    Allergies as of 03/02/2021 - Review Complete 12/16/2020  Allergen Reaction Noted   Codeine Hives 09/09/2019   Influenza vaccines Other (See Comments) 03/27/2014   Oxycodone hcl Hives 06/07/2011    Family History  Problem Relation Age of Onset   Heart failure Mother    Hypertension Mother    Heart disease Mother        Irregular heart valve   Hypertension Father    Emphysema Father    Heart disease Father    COPD Father    Diabetes Brother    Heart failure Brother    Diabetes Brother    Diabetes Brother    Hyperlipidemia Brother    Hypertension Sister    Hypertension Sister     Social History   Socioeconomic History   Marital status: Divorced  Spouse name: Not on file   Number of children: 3   Years of education: Not on file   Highest education level: Not on file  Occupational History   Occupation: Disabled   Tobacco Use   Smoking status: Never   Smokeless tobacco: Never  Substance and Sexual Activity   Alcohol use: No   Drug use: No   Sexual activity: Not Currently  Other Topics Concern   Not on file  Social History Narrative   Not on file   Social Determinants of Health   Financial Resource Strain: Low Risk    Difficulty of Paying Living Expenses: Not very hard  Food Insecurity: No Food Insecurity   Worried About Running Out of Food in the Last Year: Never true   Ran Out of Food in the Last Year: Never true  Transportation Needs: No Transportation Needs   Lack of Transportation (Medical): No   Lack of Transportation  (Non-Medical): No  Physical Activity: Sufficiently Active   Days of Exercise per Week: 3 days   Minutes of Exercise per Session: 60 min  Stress: No Stress Concern Present   Feeling of Stress : Not at all  Social Connections: Moderately Integrated   Frequency of Communication with Friends and Family: More than three times a week   Frequency of Social Gatherings with Friends and Family: Three times a week   Attends Religious Services: More than 4 times per year   Active Member of Clubs or Organizations: Yes   Attends Music therapist: More than 4 times per year   Marital Status: Divorced  Human resources officer Violence: Not At Risk   Fear of Current or Ex-Partner: No   Emotionally Abused: No   Physically Abused: No   Sexually Abused: No    Review of Systems: Gen: Denies any fever, chills, fatigue, weight loss, lack of appetite.  CV: Denies chest pain, heart palpitations, peripheral edema, syncope.  Resp: Denies shortness of breath at rest or with exertion. Denies wheezing or cough.  GI: Denies dysphagia or odynophagia. Denies jaundice, hematemesis, fecal incontinence. GU : Denies urinary burning, urinary frequency, urinary hesitancy MS: Denies joint pain, muscle weakness, cramps, or limitation of movement.  Derm: Denies rash, itching, dry skin Psych: Denies depression, anxiety, memory loss, and confusion Heme: Denies bruising, bleeding, and enlarged lymph nodes.  Physical Exam: There were no vitals taken for this visit. General:   Alert and oriented. Pleasant and cooperative. Well-nourished and well-developed.  Head:  Normocephalic and atraumatic. Eyes:  Without icterus, sclera clear and conjunctiva pink.  Ears:  Normal auditory acuity. Nose:  No deformity, discharge,  or lesions. Mouth:  No deformity or lesions, oral mucosa pink.  Neck:  Supple, without mass or thyromegaly. Lungs:  Clear to auscultation bilaterally. No wheezes, rales, or rhonchi. No distress.  Heart:   S1, S2 present without murmurs appreciated.  Abdomen:  +BS, soft, non-tender and non-distended. No HSM noted. No guarding or rebound. No masses appreciated.  Rectal:  Deferred  Msk:  Symmetrical without gross deformities. Normal posture. Pulses:  Normal pulses noted. Extremities:  Without clubbing or edema. Neurologic:  Alert and  oriented x4;  grossly normal neurologically. Skin:  Intact without significant lesions or rashes. Cervical Nodes:  No significant cervical adenopathy. Psych:  Alert and cooperative. Normal mood and affect.

## 2021-03-02 ENCOUNTER — Ambulatory Visit: Payer: Medicare HMO | Admitting: Gastroenterology

## 2021-03-11 ENCOUNTER — Other Ambulatory Visit: Payer: Self-pay | Admitting: Family Medicine

## 2021-03-18 ENCOUNTER — Encounter: Payer: Self-pay | Admitting: Nutrition

## 2021-03-18 ENCOUNTER — Other Ambulatory Visit: Payer: Self-pay

## 2021-03-18 ENCOUNTER — Encounter: Payer: Medicare HMO | Attending: Family Medicine | Admitting: Nutrition

## 2021-03-18 VITALS — Ht 67.0 in | Wt 299.6 lb

## 2021-03-18 DIAGNOSIS — E785 Hyperlipidemia, unspecified: Secondary | ICD-10-CM | POA: Insufficient documentation

## 2021-03-18 DIAGNOSIS — I10 Essential (primary) hypertension: Secondary | ICD-10-CM | POA: Insufficient documentation

## 2021-03-18 DIAGNOSIS — R7303 Prediabetes: Secondary | ICD-10-CM | POA: Insufficient documentation

## 2021-03-18 NOTE — Progress Notes (Signed)
Medical Nutrition Therapy  Appointment Start time:  9381  [id number] End time:  7782  Primary concerns today: Obesity and Prediabetes   Referral diagnosis: e66.01 and R73.03 Preferred learning style: No preferences Learning readiness: change in process    NUTRITION ASSESSMENT  Wants to lose weight and prevent diabetes. PCP Dr. Moshe Cipro, A1C 5.3% Morbid obese with BMI of 46. Doesn't snack anymore. Has been eating better and earlier recently. Was heavy as a child. Lowest wt was 250's. She has lost 18 lbs in the last 6-8 months.  Current eating habits of eating twice a day is contributing to her lack of weight loss and insuffient to meet her needs.  Anthropometrics  Wt Readings from Last 3 Encounters:  03/18/21 299 lb 9.6 oz (135.9 kg)  12/16/20 296 lb (134.3 kg)  09/02/20 (!) 317 lb (143.8 kg)   Ht Readings from Last 3 Encounters:  03/18/21 5\' 7"  (1.702 m)  12/16/20 5\' 7"  (1.702 m)  09/02/20 5\' 7"  (1.702 m)   Body mass index is 46.92 kg/m. @BMIFA @ Facility age limit for growth percentiles is 20 years. Facility age limit for growth percentiles is 20 years.    Clinical Medical Hx: HTN, Hyperlipidemia, Arthritis, allergies,  Medications: see chart Labs: A1C 5.3% CMP Latest Ref Rng & Units 01/12/2021 12/16/2020 08/26/2020  Glucose 70 - 99 mg/dL 94 107(H) 94  BUN 8 - 27 mg/dL 13 18 19   Creatinine 0.57 - 1.00 mg/dL 0.90 1.06(H) 1.10(H)  Sodium 134 - 144 mmol/L 142 141 140  Potassium 3.5 - 5.2 mmol/L 3.5 3.6 3.5  Chloride 96 - 106 mmol/L 100 96 96  CO2 20 - 29 mmol/L 27 27 27   Calcium 8.7 - 10.3 mg/dL 9.2 9.9 9.5  Total Protein 6.0 - 8.5 g/dL 6.8 - 7.1  Total Bilirubin 0.0 - 1.2 mg/dL 0.3 - 0.5  Alkaline Phos 44 - 121 IU/L 104 - 113  AST 0 - 40 IU/L 23 - 26  ALT 0 - 32 IU/L 17 - 21   Lipid Panel     Component Value Date/Time   CHOL 155 01/12/2021 1006   TRIG 128 01/12/2021 1006   HDL 41 01/12/2021 1006   CHOLHDL 3.8 01/12/2021 1006   CHOLHDL 4.4 07/12/2019 1155    VLDL 25 09/01/2016 1221   LDLCALC 91 01/12/2021 1006   LDLCALC 124 (H) 07/12/2019 1155   LABVLDL 23 01/12/2021 1006   Lab Results  Component Value Date   HGBA1C 5.3 01/12/2021    Notable Signs/Symptoms: none  Lifestyle & Dietary Hx LIves by herself. Eats out 25%, but eats at home 75%.  Estimated daily fluid intake: 80 oz Supplements: MVI, Black seed oil, tumeric Sleep: 7-8 hrs Stress / self-care: none Current average weekly physical activity: Knee problems  24-Hr Dietary Recall First Meal: yogurt, water Snack:  Second Meal:  Snack:  ...................................................................................................... Third Meal: string beans, turnip greens and toss salad, fried chicken, roll and potato salad, pound cake and Swee tea 16 Snack: none Beverages: water, tea, lemonade.  Estimated Energy Needs Calories: 1200 Carbohydrate: 135g Protein: 90g Fat: 33g   NUTRITION DIAGNOSIS  NI-1.5 Excessive energy intake As related to Obesity.  As evidenced by Current diet and BMI 46.   NUTRITION INTERVENTION  Nutrition education (E-1) on the following topics:  Lifestyle Medicine - Whole Food, Plant Predominant Nutrition is highly recommended: Eat Plenty of vegetables, Mushrooms, fruits, Legumes, Whole Grains, Nuts, seeds in lieu of processed meats, processed snacks/pastries red meat, poultry, eggs.    -It  is better to avoid simple carbohydrates including: Cakes, Sweet Desserts, Ice Cream, Soda (diet and regular), Sweet Tea, Candies, Chips, Cookies, Store Bought Juices, Alcohol in Excess of  1-2 drinks a day, Lemonade,  Artificial Sweeteners, Doughnuts, Coffee Creamers, "Sugar-free" Products, etc, etc.  This is not a complete list.....  Exercise: If you are able: 30 -60 minutes a day ,4 days a week, or 150 minutes a week.  The longer the better.  Combine stretch, strength, and aerobic activities.  If you were told in the past that you have high risk for  cardiovascular diseases, you may seek evaluation by your heart doctor prior to initiating moderate to intense exercise programs.  Weight Loss Tips  #1 Rule: Track what you eat by keeping a food journal. Knowing what, when and how much we eat provides awareness. Being aware of what we're eating can help [place] see how many calories we're eating, what quality of calories we're consuming and what foods we're not eating enough of. It takes a deficient of 3500 calories per week to lose 1 lb. To lose 1 lb per week, reduce calorie intake or increase exercise to the equivalent of 500 calories per day. #2 Rule: Drink ONLY water. 1 oz per lb of body weight. If you weigh 200 lbs, you need to drink 200 oz of water per day.   Handouts Provided Include  Plant based meal plan  Meal Plan card Calorie density of foods.  Learning Style & Readiness for Change Teaching method utilized: Visual & Auditory  Demonstrated degree of understanding via: Teach Back  Barriers to learning/adherence to lifestyle change: none  Goals Established by Pt  Follow Plant based meals plan Eat meals on time Measure foods out Cut out snacks or processed foods  Focus on plant based foods Go to Lifestyle.org for more ideas on plant based foods. Watch "[employer]" Lose 1-2 lbs per week.  MONITORING & EVALUATION Dietary intake, weekly physical activity, and weight  in 1 month.  Next Steps  Patient is to work on meal planning, keeping a food journal and exercise.Marland Kitchen

## 2021-03-18 NOTE — Patient Instructions (Addendum)
Goals  Follow Plant based meals plan Eat meals on time Measure foods out Cut out snacks or processed foods  Focus on plant based foods Go to Lifestyle.org for more ideas on plant based foods. Watch "[employer]" Lose 1-2 lbs per week.

## 2021-03-23 ENCOUNTER — Encounter: Payer: Self-pay | Admitting: Nutrition

## 2021-03-27 ENCOUNTER — Encounter: Payer: Self-pay | Admitting: Family Medicine

## 2021-03-27 ENCOUNTER — Ambulatory Visit (INDEPENDENT_AMBULATORY_CARE_PROVIDER_SITE_OTHER): Payer: Medicare HMO | Admitting: Family Medicine

## 2021-03-27 ENCOUNTER — Other Ambulatory Visit: Payer: Self-pay

## 2021-03-27 VITALS — Ht 67.0 in | Wt 299.0 lb

## 2021-03-27 DIAGNOSIS — M25561 Pain in right knee: Secondary | ICD-10-CM | POA: Diagnosis not present

## 2021-03-27 DIAGNOSIS — G8929 Other chronic pain: Secondary | ICD-10-CM

## 2021-03-27 NOTE — Progress Notes (Signed)
Virtual Visit via Telephone Note  I connected with Michele Lowe on 03/27/21 at 11:40 AM EST by telephone and verified that I am speaking with the correct person using two identifiers.  Location: Patient: on the road, not driving Provider: office   I discussed the limitations, risks, security and privacy concerns of performing an evaluation and management service by telephone and the availability of in person appointments. I also discussed with the patient that there may be a patient responsible charge related to this service. The patient expressed understanding and agreed to proceed.   History of Present Illness:   requests referral to Ortho re chronic , disabling and progressive right knee pain and instability. [place] requires weight loss before operating, states though working on this does not feel she will get there soon enough , specifically requests Dr Aline Brochure to evaluate based on great experiences from her family. No falls , but unsteady and stays at a pain rate of 8 Ob F/U chronic problems and address any new or current concerns. Review and update medications and allergies. Review recent lab and radiologic data . Update routine health maintainace. Review an encourage improved health habits to include nutrition, exercise and  sleep . Good communication with no confusion and intact memory. Alert and oriented x 3 No signs of respiratory distress during speech    Assessment and Plan: Knee pain, right Never had surgery, pain rated at 8, limits  Mobility, and unsteady no falls , requests Ortho eval  Has had 2 surgeries on left knee 2 replacements , at [facility], 2007, and in [place]  Patient re-educated about  the importance of commitment to a  minimum of 150 minutes of exercise per week as able.  The importance of healthy food choices with portion control discussed, as well as eating regularly and within a 12 hour window most days. The need to choose "clean ,  green" food 50 to 75% of the time is discussed, as well as to make water the primary drink and set a goal of 64 ounces water daily.    Weight /BMI 03/27/2021 03/18/2021 12/16/2020  WEIGHT 299 lb 299 lb 9.6 oz 296 lb  HEIGHT 5\' 7"  5\' 7"  5\' 7"   BMI 46.83 kg/m2 46.92 kg/m2 46.36 kg/m2       Follow Up Instructions:    I discussed the assessment and treatment plan with the patient. The patient was provided an opportunity to ask questions and all were answered. The patient agreed with the plan and demonstrated an understanding of the instructions.   The patient was advised to call back or seek an in-person evaluation if the symptoms worsen or if the condition fails to improve as anticipated.  I provided 8 minutes of non-face-to-face time during this encounter.   Tula Nakayama, MD

## 2021-03-27 NOTE — Assessment & Plan Note (Signed)
°  Patient re-educated about  the importance of commitment to a  minimum of 150 minutes of exercise per week as able.  The importance of healthy food choices with portion control discussed, as well as eating regularly and within a 12 hour window most days. The need to choose "clean , green" food 50 to 75% of the time is discussed, as well as to make water the primary drink and set a goal of 64 ounces water daily.    Weight /BMI 03/27/2021 03/18/2021 12/16/2020  WEIGHT 299 lb 299 lb 9.6 oz 296 lb  HEIGHT 5\' 7"  5\' 7"  5\' 7"   BMI 46.83 kg/m2 46.92 kg/m2 46.36 kg/m2

## 2021-03-27 NOTE — Patient Instructions (Addendum)
F/U as before, call if you need me sooner  You are referred to Dr Aline Brochure regarding right knee  Be careful not to fall  Continue to work on weight loss  Thanks for choosing [facility], we consider it a privelige to serve you.

## 2021-03-27 NOTE — Assessment & Plan Note (Addendum)
Never had surgery, pain rated at 8, limits  Mobility, and unsteady no falls , requests Ortho eval  Has had 2 surgeries on left knee 2 replacements , at [facility], 2007, and in 2014/15

## 2021-04-06 ENCOUNTER — Other Ambulatory Visit: Payer: Self-pay | Admitting: Family Medicine

## 2021-04-15 ENCOUNTER — Ambulatory Visit (INDEPENDENT_AMBULATORY_CARE_PROVIDER_SITE_OTHER): Payer: Medicare HMO | Admitting: Family Medicine

## 2021-04-15 ENCOUNTER — Encounter: Payer: Self-pay | Admitting: Family Medicine

## 2021-04-15 ENCOUNTER — Other Ambulatory Visit: Payer: Self-pay

## 2021-04-15 ENCOUNTER — Ambulatory Visit: Payer: Medicare HMO | Admitting: Family Medicine

## 2021-04-15 VITALS — BP 128/78 | HR 74 | Temp 98.3°F | Ht 67.0 in | Wt 301.0 lb

## 2021-04-15 DIAGNOSIS — R7303 Prediabetes: Secondary | ICD-10-CM | POA: Diagnosis not present

## 2021-04-15 DIAGNOSIS — I10 Essential (primary) hypertension: Secondary | ICD-10-CM | POA: Diagnosis not present

## 2021-04-15 DIAGNOSIS — E559 Vitamin D deficiency, unspecified: Secondary | ICD-10-CM

## 2021-04-15 DIAGNOSIS — E785 Hyperlipidemia, unspecified: Secondary | ICD-10-CM

## 2021-04-15 DIAGNOSIS — Z1211 Encounter for screening for malignant neoplasm of colon: Secondary | ICD-10-CM | POA: Diagnosis not present

## 2021-04-15 MED ORDER — ROSUVASTATIN CALCIUM 5 MG PO TABS: ORAL_TABLET | ORAL | 0 refills | Status: DC

## 2021-04-15 NOTE — Patient Instructions (Addendum)
F/U in 6 months, call if you need me sooner ? ?Excellent blood pressure continue current meds ? ?Keep taking crestor 3 times weekly,  you DO have enough tabs at home to last till year end ? ?Continue with healthy lifestyle changes ? ?Fasting lipid, cmp and EGFr , hBA1C, cBC, TSH and Vit D 1 week before next visit ? ?Nurse please arrange cologuard to be done in July and enter result of FIT test done in 08/2020 already in chart ? ?Thanks for choosing [facility], we consider it a privelige to serve you. ? ?

## 2021-04-16 ENCOUNTER — Ambulatory Visit: Payer: Medicare HMO

## 2021-04-16 ENCOUNTER — Ambulatory Visit (INDEPENDENT_AMBULATORY_CARE_PROVIDER_SITE_OTHER): Payer: Medicare HMO | Admitting: Orthopedic Surgery

## 2021-04-16 ENCOUNTER — Encounter: Payer: Self-pay | Admitting: Family Medicine

## 2021-04-16 ENCOUNTER — Encounter: Payer: Self-pay | Admitting: Orthopedic Surgery

## 2021-04-16 VITALS — BP 165/90 | HR 94 | Ht 67.0 in | Wt 301.0 lb

## 2021-04-16 DIAGNOSIS — M1711 Unilateral primary osteoarthritis, right knee: Secondary | ICD-10-CM

## 2021-04-16 DIAGNOSIS — G8929 Other chronic pain: Secondary | ICD-10-CM

## 2021-04-16 DIAGNOSIS — M25561 Pain in right knee: Secondary | ICD-10-CM

## 2021-04-16 DIAGNOSIS — Z6841 Body Mass Index (BMI) 40.0 and over, adult: Secondary | ICD-10-CM

## 2021-04-16 DIAGNOSIS — M1710 Unilateral primary osteoarthritis, unspecified knee: Secondary | ICD-10-CM

## 2021-04-16 NOTE — Patient Instructions (Signed)
Goal weight is 245 ? ? ?

## 2021-04-16 NOTE — Progress Notes (Signed)
Chief Complaint  ?Patient presents with  ? Knee Pain  ?  Rt knee pain for years, getting worse over past 3 wks.   ? ? ?HPI: Michele Lowe is 71 years old she is the mother of one of our recent bilateral total knee patients she comes in for evaluation treatment of right knee pain which she has had for years getting worse over the past 3 to 4 weeks waxing and waning between pain levels of 10 out of 10 ? ?She had a left total knee which required revision.  Index procedure 2000 08/25/2011 revision for loosening ? ?Currently she complains of recent worsening ability to perform activities of daily living. ? ?She has already tried attempts at weight loss indicating she does not want to take any medication because of the side effects and she is trying to do it with weight already losing about 50 pounds ? ?Currently she is Aspercreme and Tylenol for pain relief with an adequate resolution of pain ? ? ? ?Past Medical History:  ?Diagnosis Date  ? Allergy   ? Annual physical exam 08/04/2015  ? Arthritis   ? Cancer ([place]) 06/20/2014  ? MELANOMA, LEFT  LOWER ARM  ? Educated about COVID-19 virus infection 07/01/2018  ? Essential hypertension, benign 2009  ? Hypertension   ? Phreesia 09/09/2019  ? KNEE PAIN, RIGHT 09/13/2008  ? Qualifier: Diagnosis of  By: Cori Razor LPN, Brandi   Qualifier: Diagnosis of  By: Moshe Cipro MD, Margaret    ? Metabolic syndrome X 05/22/8293  ? Morbid obesity ([place])   ? Need for influenza vaccination 11/27/2018  ? Rhinosinusitis   ? ? ?BP (!) 165/90   Pulse 94   Ht 5\' 7"  (1.702 m)   Wt (!) 301 lb (136.5 kg)   BMI 47.14 kg/m?  ? ? ?General appearance: Well-developed well-nourished no gross deformities ? ?Cardiovascular color changes pigmentation lower extremities bilaterally no swelling no tenderness ? ?(History of ulcerations treated with appropriate wrap for venous stasis disease, multiple studies for vascular inflow were normal) ? ? ?Neurologically no sensation loss or deficits or pathologic reflexes ? ?Psychological:  Awake alert and oriented x3 mood and affect normal ? ?Skin no lacerations or ulcerations no nodularity no palpable masses, no erythema or nodularity ? ?Musculoskeletal:  ? ?Left knee large skin incision no signs of infection seems to be functioning well ? ?Right knee skin is normal, tenderness medial and lateral joint line with crepitance on range of motion active flexion 95 degrees, no instability detected ? ?Imaging x-ray of the right knee shows 9 degree valgus severe arthritis with narrowing of both compartments patellofemoral moderate arthritis medial side moderate arthritis ? ?A/P ? ?Encounter Diagnoses  ?Name Primary?  ? Chronic pain of right knee   ? Primary localized osteoarthritis of knee right knee Yes  ? Body mass index 45.0-49.9, adult ([place])   ? Morbid obesity ([place])   ? ?The patient meets the AMA guidelines for Morbid (severe) obesity with a BMI > 40.0 and I have recommended weight loss. ? ?We reviewed her attempts at weight loss and weight loss counseling was performed as well.  She will try diet control and exercise first if not successful she will try the weight loss medications and if not successful with that then a consideration can be done for gastric bypass ? ?She will correspond with me on MyChart periodically to let me know how the weight loss is going. ?

## 2021-04-17 ENCOUNTER — Encounter: Payer: Self-pay | Admitting: Family Medicine

## 2021-04-17 ENCOUNTER — Other Ambulatory Visit: Payer: Self-pay

## 2021-04-20 ENCOUNTER — Encounter: Payer: Self-pay | Admitting: Family Medicine

## 2021-04-20 NOTE — Assessment & Plan Note (Signed)
Controlled, no change in medication ?DASH diet and commitment to daily physical activity for a minimum of 30 minutes discussed and encouraged, as a part of hypertension management. ?The importance of attaining a healthy weight is also discussed. ? ?BP/Weight 04/16/2021 04/15/2021 03/27/2021 03/18/2021 12/16/2020 09/02/2020 04/09/2020  ?Systolic BP 098 119 - - 147 120 -  ?Diastolic BP 90 78 - - 88 82 -  ?Wt. (Lbs) 301 301 299 299.6 296 317 311  ?BMI 47.14 47.14 46.83 46.92 46.36 49.65 48.71  ? ? ? ? ?

## 2021-04-20 NOTE — Assessment & Plan Note (Signed)
?  Patient re-educated about  the importance of commitment to a  minimum of 150 minutes of exercise per week as able. ? ?The importance of healthy food choices with portion control discussed, as well as eating regularly and within a 12 hour window most days. ?The need to choose "clean , green" food 50 to 75% of the time is discussed, as well as to make water the primary drink and set a goal of 64 ounces water daily. ? ?  ?Weight /BMI 04/16/2021 04/15/2021 03/27/2021  ?WEIGHT 301 lb 301 lb 299 lb  ?HEIGHT '5\' 7"'$  '5\' 7"'$  '5\' 7"'$   ?BMI 47.14 kg/m2 47.14 kg/m2 46.83 kg/m2  ? ? ? ?

## 2021-04-20 NOTE — Assessment & Plan Note (Signed)
Controlled, no change in medication ?Hyperlipidemia:Low fat diet discussed and encouraged. ? ? ?Lipid Panel  ?Lab Results  ?Component Value Date  ? CHOL 155 01/12/2021  ? HDL 41 01/12/2021  ? [place] 91 01/12/2021  ? TRIG 128 01/12/2021  ? CHOLHDL 3.8 01/12/2021  ? ? ? ? ?

## 2021-04-20 NOTE — Progress Notes (Signed)
? ?  Michele Lowe     MRN: [id number]      DOB: 05/15/1950 ? ? ?HPI ?[name] is here for follow up and re-evaluation of chronic medical conditions, medication management and review of any available recent lab and radiology data.  ?Preventive health is updated, specifically  Cancer screening and Immunization.   ?Hopes t have surgery on kne which is painful and essentially non functional, has upcoming appt with new Ortho Doc. ?The PT denies any adverse reactions to current medications since the last visit.  ?ROS ?Denies recent fever or chills. ?Denies sinus pressure, nasal congestion, ear pain or sore throat. ?Denies chest congestion, productive cough or wheezing. ?Denies chest pains, palpitations and leg swelling ?Denies abdominal pain, nausea, vomiting,diarrhea or constipation.   ?Denies dysuria, frequency, hesitancy or incontinence. ?C/o  joint pain, swelling and limitation in mobility. ?Denies headaches, seizures, numbness, or tingling. ?Denies depression, anxiety or insomnia. ?Denies skin break down or rash. ? ? ?PE ? ?BP 128/78 (BP Location: Left Arm, Patient Position: Sitting, Cuff Size: Large)   Pulse 74   Temp 98.3 ?F (36.8 ?C) (Oral)   Ht '5\' 7"'$  (1.702 m)   Wt (!) 301 lb (136.5 kg)   SpO2 96%   BMI 47.14 kg/m?  ? ?Patient alert and oriented and in no cardiopulmonary distress. ? ?HEENT: No facial asymmetry, EOMI,     Neck supple . ? ?Chest: Clear to auscultation bilaterally. ? ?CVS: S1, S2 no murmurs, no S3.Regular rate. ? ?ABD: Soft non tender.  ? ?Ext: No edema ? ?MS: Adequate ROM spine, shoulders, hips and  reduced in knees. ? ?Skin: Intact, no ulcerations or rash noted. ? ?Psych: Good eye contact, normal affect. Memory intact not anxious or depressed appearing. ? ?CNS: CN 2-12 intact, power,  normal throughout.no focal deficits noted. ? ? ?Assessment & Plan ? ?Essential hypertension ?Controlled, no change in medication ?DASH diet and commitment to daily physical activity for a minimum of 30  minutes discussed and encouraged, as a part of hypertension management. ?The importance of attaining a healthy weight is also discussed. ? ?BP/Weight 04/16/2021 04/15/2021 03/27/2021 03/18/2021 12/16/2020 09/02/2020 04/09/2020  ?Systolic BP 569 794 - - 801 120 -  ?Diastolic BP 90 78 - - 88 82 -  ?Wt. (Lbs) 301 301 299 299.6 296 317 311  ?BMI 47.14 47.14 46.83 46.92 46.36 49.65 48.71  ? ? ? ? ? ?Hyperlipidemia LDL goal <100 ?Controlled, no change in medication ?Hyperlipidemia:Low fat diet discussed and encouraged. ? ? ?Lipid Panel  ?Lab Results  ?Component Value Date  ? CHOL 155 01/12/2021  ? HDL 41 01/12/2021  ? [place] 91 01/12/2021  ? TRIG 128 01/12/2021  ? CHOLHDL 3.8 01/12/2021  ? ? ? ? ? ?MORBID OBESITY ? ?Patient re-educated about  the importance of commitment to a  minimum of 150 minutes of exercise per week as able. ? ?The importance of healthy food choices with portion control discussed, as well as eating regularly and within a 12 hour window most days. ?The need to choose "clean , green" food 50 to 75% of the time is discussed, as well as to make water the primary drink and set a goal of 64 ounces water daily. ? ?  ?Weight /BMI 04/16/2021 04/15/2021 03/27/2021  ?WEIGHT 301 lb 301 lb 299 lb  ?HEIGHT '5\' 7"'$  '5\' 7"'$  '5\' 7"'$   ?BMI 47.14 kg/m2 47.14 kg/m2 46.83 kg/m2  ? ? ? ? ?

## 2021-06-02 ENCOUNTER — Encounter: Payer: Self-pay | Admitting: Family Medicine

## 2021-06-03 NOTE — Telephone Encounter (Signed)
Patient aware.

## 2021-06-22 ENCOUNTER — Encounter: Payer: Medicare HMO | Attending: Family Medicine | Admitting: Nutrition

## 2021-06-22 ENCOUNTER — Ambulatory Visit: Payer: Medicare HMO | Admitting: Nutrition

## 2021-06-22 DIAGNOSIS — I10 Essential (primary) hypertension: Secondary | ICD-10-CM

## 2021-06-22 DIAGNOSIS — E785 Hyperlipidemia, unspecified: Secondary | ICD-10-CM

## 2021-06-22 DIAGNOSIS — R7303 Prediabetes: Secondary | ICD-10-CM

## 2021-06-22 NOTE — Patient Instructions (Addendum)
Goal ? ?Follow Lifestyle Medicine ?Increase exercise to 4 times per week. ?Cut out sodas, lemonade and liquid sugary foods.  ?Increase plant based foods-fruits,whole grains, and vegetables. ?Drink 100 oz per day of water ?Lose 1 lb per week. ? ?

## 2021-06-22 NOTE — Progress Notes (Signed)
Medical Nutrition Therapy Begin 1500  Appointment End time:  5277  Primary concerns today: Obesity and Prediabetes   Referral diagnosis: e66.01 and R73.03 Preferred learning style: No preferences Learning readiness: change in process    NUTRITION ASSESSMENT   Just joined a group doing exercises twice a week. Has been trying to do some exercises at home. She feels better. Sleeping better.  Has a little sore muscles. Changed: Working on more vegetables, cutting down on fried foods. Has cut down on desserts. Has cut down on ginger ale and sweet tea. Doing some lemonade. Drinking more water. Lost 2 lbs.  Has been focusing on  drinking more water when she is hungry.  Her clothes are fitting looser.   Anthropometrics  Wt Readings from Last 3 Encounters:  06/22/21 299 lb (135.6 kg)  04/16/21 (!) 301 lb (136.5 kg)  04/15/21 (!) 301 lb (136.5 kg)   Ht Readings from Last 3 Encounters:  06/22/21 '5\' 7"'$  (1.702 m)  04/16/21 '5\' 7"'$  (1.702 m)  04/15/21 '5\' 7"'$  (1.702 m)   Body mass index is 46.83 kg/m. '@BMIFA'$ @ Facility age limit for growth percentiles is 20 years. Facility age limit for growth percentiles is 20 years.    Clinical Medical Hx: HTN, Hyperlipidemia, Arthritis, allergies,  Medications: see chart Labs: A1C 5.3%    Latest Ref Rng & Units 01/12/2021   10:06 AM 12/16/2020    9:40 AM 08/26/2020    8:07 AM  CMP  Glucose 70 - 99 mg/dL 94   107   94    BUN 8 - 27 mg/dL '13   18   19    '$ Creatinine 0.57 - 1.00 mg/dL 0.90   1.06   1.10    Sodium 134 - 144 mmol/L 142   141   140    Potassium 3.5 - 5.2 mmol/L 3.5   3.6   3.5    Chloride 96 - 106 mmol/L 100   96   96    CO2 20 - 29 mmol/L '27   27   27    '$ Calcium 8.7 - 10.3 mg/dL 9.2   9.9   9.5    Total Protein 6.0 - 8.5 g/dL 6.8    7.1    Total Bilirubin 0.0 - 1.2 mg/dL 0.3    0.5    Alkaline Phos 44 - 121 IU/L 104    113    AST 0 - 40 IU/L 23    26    ALT 0 - 32 IU/L 17    21     Lipid Panel     Component Value Date/Time    CHOL 155 01/12/2021 1006   TRIG 128 01/12/2021 1006   HDL 41 01/12/2021 1006   CHOLHDL 3.8 01/12/2021 1006   CHOLHDL 4.4 07/12/2019 1155   VLDL 25 09/01/2016 1221   LDLCALC 91 01/12/2021 1006   LDLCALC 124 (H) 07/12/2019 1155   LABVLDL 23 01/12/2021 1006   Lab Results  Component Value Date   HGBA1C 5.3 01/12/2021    Notable Signs/Symptoms: none  Lifestyle & Dietary Hx LIves by herself. Eats out 25%, but eats at home 75%.  Estimated daily fluid intake: 80 oz Supplements: MVI, Black seed oil, tumeric Sleep: 7-8 hrs Stress / self-care: none Current average weekly physical activity: Knee problems  24-Hr Dietary Recall First Meal: yogurt, water Snack:  Second Meal:  Snack:  ...................................................................................................... Third Meal: string beans, turnip greens and toss salad, fried chicken, roll and potato salad, pound cake  and Swee tea 16 Snack: none Beverages: water, tea, lemonade.  Estimated Energy Needs Calories: 1200 Carbohydrate: 135g Protein: 90g Fat: 33g   NUTRITION DIAGNOSIS  NI-1.5 Excessive energy intake As related to Obesity.  As evidenced by Current diet and BMI 46.   NUTRITION INTERVENTION  Nutrition education (E-1) on the following topics:  Lifestyle Medicine - Whole Food, Plant Predominant Nutrition is highly recommended: Eat Plenty of vegetables, Mushrooms, fruits, Legumes, Whole Grains, Nuts, seeds in lieu of processed meats, processed snacks/pastries red meat, poultry, eggs.    -It is better to avoid simple carbohydrates including: Cakes, Sweet Desserts, Ice Cream, Soda (diet and regular), Sweet Tea, Candies, Chips, Cookies, Store Bought Juices, Alcohol in Excess of  1-2 drinks a day, Lemonade,  Artificial Sweeteners, Doughnuts, Coffee Creamers, "Sugar-free" Products, etc, etc.  This is not a complete list.....  Exercise: If you are able: 30 -60 minutes a day ,4 days a week, or 150 minutes a  week.  The longer the better.  Combine stretch, strength, and aerobic activities.  If you were told in the past that you have high risk for cardiovascular diseases, you may seek evaluation by your heart doctor prior to initiating moderate to intense exercise programs.  Weight Loss Tips  #1 Rule: Track what you eat by keeping a food journal. Knowing what, when and how much we eat provides awareness. Being aware of what we're eating can help [place] see how many calories we're eating, what quality of calories we're consuming and what foods we're not eating enough of. It takes a deficient of 3500 calories per week to lose 1 lb. To lose 1 lb per week, reduce calorie intake or increase exercise to the equivalent of 500 calories per day. #2 Rule: Drink ONLY water. 1 oz per lb of body weight. If you weigh 200 lbs, you need to drink 200 oz of water per day.   Handouts Provided Include  Plant based meal plan  Meal Plan card Calorie density of foods.  Learning Style & Readiness for Change Teaching method utilized: Visual & Auditory  Demonstrated degree of understanding via: Teach Back  Barriers to learning/adherence to lifestyle change: none  Goals Established by Pt  Follow Plant based meals plan Eat meals on time Measure foods out Cut out snacks or processed foods  Focus on plant based foods Go to Lifestyle.org for more ideas on plant based foods. Watch "[employer]" Lose 1-2 lbs per week.  MONITORING & EVALUATION Dietary intake, weekly physical activity, and weight  in 1 month.  Next Steps  Patient is to work on meal planning, keeping a food journal and exercise.Marland Kitchen

## 2021-06-29 ENCOUNTER — Ambulatory Visit: Payer: Medicare HMO | Admitting: Gastroenterology

## 2021-09-08 ENCOUNTER — Other Ambulatory Visit: Payer: Self-pay | Admitting: Family Medicine

## 2021-09-14 ENCOUNTER — Ambulatory Visit (INDEPENDENT_AMBULATORY_CARE_PROVIDER_SITE_OTHER): Payer: Medicare HMO

## 2021-09-14 DIAGNOSIS — Z0000 Encounter for general adult medical examination without abnormal findings: Secondary | ICD-10-CM | POA: Diagnosis not present

## 2021-09-14 NOTE — Progress Notes (Signed)
Subjective:   LYDA COLCORD is a 71 y.o. female who presents for Medicare Annual (Subsequent) preventive examination. I connected with  [name] on 09/14/21 by a audio enabled telemedicine application and verified that I am speaking with the correct person using two identifiers.  Patient Location: Home  Provider Location: Office/Clinic  I discussed the limitations of evaluation and management by telemedicine. The patient expressed understanding and agreed to proceed.   Review of Systems     Ms. Ileene Patrick , Thank you for taking time to come for your Medicare Wellness Visit. I appreciate your ongoing commitment to your health goals. Please review the following plan we discussed and let me know if I can assist you in the future.   These are the goals we discussed:  Goals      Set My Weight Loss Goal     Wants to lose 50 more lbs     Weight (lb) < 200 lb (90.7 kg)     Would like to lose a little weight and exercise a little more         This is a list of the screening recommended for you and due dates:  Health Maintenance  Topic Date Due   Cologuard (Stool DNA test)  10/05/2020   COVID-19 Vaccine (5 - Moderna series) 02/20/2021   Zoster (Shingles) Vaccine (2 of 2) 06/12/2021   Tetanus Vaccine  04/08/2022*   Flu Shot  09/15/2021   Mammogram  02/03/2023   Pneumonia Vaccine  Completed   DEXA scan (bone density measurement)  Completed   Hepatitis C Screening: USPSTF Recommendation to screen - Ages 71-79 yo.  Completed   HPV Vaccine  Aged Out   Colon Cancer Screening  Discontinued  *Topic was postponed. The date shown is not the original due date.          Objective:    There were no vitals filed for this visit. There is no height or weight on file to calculate BMI.     09/12/2020   11:17 AM 09/05/2017    8:58 AM 09/01/2016   10:55 AM  Advanced Directives  Does Patient Have a Medical Advance Directive? No No No  Would patient like information on creating a  medical advance directive? No - Patient declined Yes (ED - Information included in AVS) No - Patient declined    Current Medications (verified) Outpatient Encounter Medications as of 09/14/2021  Medication Sig   acetaminophen (TYLENOL) 500 MG tablet Take 500 mg by mouth. Takes as needed   [employer] Oil 500 MG CAPS Take by mouth.   calcium-vitamin D (OSCAL WITH D) 500-200 MG-UNIT tablet Take 1 tablet by mouth 2 (two) times daily.   cholecalciferol (VITAMIN D3) 25 MCG (1000 UNIT) tablet Take 1,000 Units by mouth daily.   furosemide (LASIX) 40 MG tablet TAKE 1 TABLET (40 MG TOTAL) BY MOUTH DAILY.   loratadine (CLARITIN) 10 MG tablet Take 1 tablet (10 mg total) by mouth daily.   Multiple Vitamin (MULTIVITAMIN PO) Take by mouth daily.   OVER THE COUNTER MEDICATION Tumeric 2250 mg  1 - 2 per day   OVER THE COUNTER MEDICATION glucosamine 1500 mg Chondroitin 1200 mg  one daily   OVER THE COUNTER MEDICATION Aspercreme with 4% lidocaine roll on liquid. Apply to right knee for pain as needed.   potassium chloride SA (KLOR-CON M) 20 MEQ tablet TAKE 1 TABLET (20 MEQ TOTAL) BY MOUTH DAILY.   rosuvastatin (CRESTOR) 5 MG  tablet Take one tablet every Mon, Wed, Friday   rosuvastatin (CRESTOR) 5 MG tablet Take one tablet by mouth every Monday, Wednesday and Friday   triamterene-hydrochlorothiazide (MAXZIDE-25) 37.5-25 MG tablet TAKE 1 AND 1/2 TABLETS ONE TIME DAILY   No facility-administered encounter medications on file as of 09/14/2021.    Allergies (verified) Codeine, Influenza vaccines, and Oxycodone hcl   History: Past Medical History:  Diagnosis Date   Allergy    Annual physical exam 08/04/2015   Arthritis    Cancer ([place]) 06/20/2014   MELANOMA, LEFT  LOWER ARM   Educated about COVID-19 virus infection 07/01/2018   Essential hypertension, benign 2009   Hypertension    Phreesia 09/09/2019   KNEE PAIN, RIGHT 09/13/2008   Qualifier: Diagnosis of  By: Cori Razor LPN, Brandi   Qualifier:  Diagnosis of  By: Moshe Cipro MD, Margaret     Metabolic syndrome X 1/0/6269   Morbid obesity ([place])    Need for influenza vaccination 11/27/2018   Rhinosinusitis    Past Surgical History:  Procedure Laterality Date   ABDOMINAL HYSTERECTOMY     Partial    JOINT REPLACEMENT  05/13/2011   Revision of left knee replacement in 2008   [place]  06/20/2014   Dr Harriet Masson   Partial hysterectomy ,secondary to fibroid . Pt still have both ovaries  1978   Rt. forearm surgery for nerve repair  1989   TOTAL KNEE ARTHROPLASTY  2007   Right    Family History  Problem Relation Age of Onset   Heart failure Mother    Hypertension Mother    Heart disease Mother        Irregular heart valve   Hypertension Father    Emphysema Father    Heart disease Father    COPD Father    Diabetes Brother    Heart failure Brother    Diabetes Brother    Diabetes Brother    Hyperlipidemia Brother    Hypertension Sister    Hypertension Sister    Social History   Socioeconomic History   Marital status: Divorced    Spouse name: Not on file   Number of children: 3   Years of education: Not on file   Highest education level: Not on file  Occupational History   Occupation: Disabled   Tobacco Use   Smoking status: Never   Smokeless tobacco: Never  Substance and Sexual Activity   Alcohol use: No   Drug use: No   Sexual activity: Not Currently  Other Topics Concern   Not on file  Social History Narrative   Not on file   Social Determinants of Health   Financial Resource Strain: Low Risk  (09/12/2020)   Overall Financial Resource Strain (CARDIA)    Difficulty of Paying Living Expenses: Not very hard  Food Insecurity: No Food Insecurity (09/12/2020)   Hunger Vital Sign    Worried About Running Out of Food in the Last Year: Never true    Ran Out of Food in the Last Year: Never true  Transportation Needs: No Transportation Needs (09/12/2020)   PRAPARE - Hydrologist  (Medical): No    Lack of Transportation (Non-Medical): No  Physical Activity: Sufficiently Active (09/12/2020)   Exercise Vital Sign    Days of Exercise per Week: 3 days    Minutes of Exercise per Session: 60 min  Stress: No Stress Concern Present (09/12/2020)   [place]  Feeling of Stress : Not at all  Social Connections: Moderately Integrated (09/12/2020)   Social Connection and Isolation Panel [NHANES]    Frequency of Communication with Friends and Family: More than three times a week    Frequency of Social Gatherings with Friends and Family: Three times a week    Attends Religious Services: More than 4 times per year    Active Member of Clubs or Organizations: Yes    Attends Archivist Meetings: More than 4 times per year    Marital Status: Divorced    Tobacco Counseling Counseling given: Not Answered   Clinical Intake:              How often do you need to have someone help you when you read instructions, pamphlets, or other written materials from your doctor or pharmacy?: (P) 1 - Never  Diabetic? No         Activities of Daily Living    09/10/2021    9:44 AM  In your present state of health, do you have any difficulty performing the following activities:  Hearing? 0  Vision? 0  Difficulty concentrating or making decisions? 0  Walking or climbing stairs? 1  Dressing or bathing? 0  Doing errands, shopping? 0  Preparing Food and eating ? N  Using the Toilet? N  In the past six months, have you accidently leaked urine? N  Do you have problems with loss of bowel control? N  Managing your Medications? N  Managing your Finances? N  Housekeeping or managing your Housekeeping? N    Patient Care Team: Fayrene Helper, MD as PCP - General Devonne Doughty, MD as Referring Physician (Orthopedic Surgery) Satira Sark, MD as Consulting Physician (Cardiology) Garrel Ridgel,  DPM as Consulting Physician (Podiatry)  Indicate any recent Medical Services you may have received from other than [facility] providers in the past year (date may be approximate).     Assessment:   This is a routine wellness examination for [place].  Hearing/Vision screen No results found.  Dietary issues and exercise activities discussed:     Goals Addressed   None   Depression Screen    04/15/2021   10:17 AM 03/27/2021   11:49 AM 03/18/2021    2:44 PM 12/16/2020    8:15 AM 09/12/2020   11:24 AM 09/12/2020   11:14 AM 09/02/2020   10:37 AM  PHQ 2/9 Scores  PHQ - 2 Score 0 0 0 0 0 0 0    Fall Risk    09/10/2021    9:44 AM 04/15/2021   10:17 AM 03/27/2021   11:49 AM 03/18/2021    2:44 PM 12/16/2020    8:15 AM  Fall Risk   Falls in the past year? 0 0 0 0 0  Number falls in past yr: 0  0 0 0  Injury with Fall? 0  0 0 0  Risk for fall due to :   No Fall Risks    Follow up   Falls evaluation completed      FALL RISK PREVENTION PERTAINING TO THE HOME:  Any stairs in or around the home? Yes  If so, are there any without handrails? No  Home free of loose throw rugs in walkways, pet beds, electrical cords, etc? Yes  Adequate lighting in your home to reduce risk of falls? Yes   ASSISTIVE DEVICES UTILIZED TO PREVENT FALLS:  Life alert? No  Use of a cane, walker or w/c? Yes  Grab bars in the bathroom? Yes  Shower chair or bench in shower? No  Elevated toilet seat or a handicapped toilet? Yes    Cognitive Function:        09/12/2020   11:25 AM 09/11/2019    2:47 PM 09/07/2018    1:35 PM 09/05/2017    9:02 AM  6CIT Screen  What Year? 0 points 0 points 0 points 0 points  What month? 0 points 0 points 0 points 0 points  What time? 0 points 0 points 0 points 0 points  Count back from 20 0 points 0 points 0 points 0 points  Months in reverse 0 points 0 points 0 points 0 points  Repeat phrase 0 points 4 points 0 points 6 points  Total Score 0 points 4 points 0 points 6 points     Immunizations Immunization History  Administered Date(s) Administered   Fluad Quad(high Dose 65+) 11/27/2018, 12/04/2019, 12/16/2020   Influenza Whole 11/23/2006   Moderna Sars-Covid-2 Vaccination 05/15/2019, 06/15/2019, 12/25/2019, 12/26/2020   Pneumococcal Conjugate-13 10/31/2013   Pneumococcal Polysaccharide-23 08/04/2015   Td 11/23/2006   Zoster Recombinat (Shingrix) 04/17/2021   Zoster, Live 02/13/2009    TDAP status: Due, Education has been provided regarding the importance of this vaccine. Advised may receive this vaccine at local pharmacy or Health Dept. Aware to provide a copy of the vaccination record if obtained from local pharmacy or Health Dept. Verbalized acceptance and understanding.  Flu Vaccine status: Up to date  Pneumococcal vaccine status: Up to date  Covid-19 vaccine status: Completed vaccines  Qualifies for Shingles Vaccine? Yes   Zostavax completed Yes   Shingrix Completed?: Yes  Screening Tests Health Maintenance  Topic Date Due   Fecal DNA (Cologuard)  10/05/2020   COVID-19 Vaccine (5 - Moderna series) 02/20/2021   Zoster Vaccines- Shingrix (2 of 2) 06/12/2021   TETANUS/TDAP  04/08/2022 (Originally 11/22/2016)   INFLUENZA VACCINE  09/15/2021   MAMMOGRAM  02/03/2023   Pneumonia Vaccine 59+ Years old  Completed   DEXA SCAN  Completed   Hepatitis C Screening  Completed   HPV VACCINES  Aged Out   COLONOSCOPY (Pts 45-76yr Insurance coverage will need to be confirmed)  [place] Due  Topic Date Due   Fecal DNA (Cologuard)  10/05/2020   COVID-19 Vaccine (5 - Moderna series) 02/20/2021   Zoster Vaccines- Shingrix (2 of 2) 06/12/2021    Colorectal cancer screening: Type of screening: Cologuard. Completed 04/15/2021. Repeat every 3 years  Mammogram status: Completed 02/02/2021. Repeat every year  Bone Density status: Completed 01/30/2020. Results reflect: Bone density results: OSTEOPOROSIS. Repeat  every 3 years.  Lung Cancer Screening: (Low Dose CT Chest recommended if Age 73-80years, 30 pack-year currently smoking OR have quit w/in 15years.) does not qualify.   Additional Screening:  Hepatitis C Screening: does not qualify;   Vision Screening: Recommended annual ophthalmology exams for early detection of glaucoma and other disorders of the eye. Is the patient up to date with their annual eye exam?  No  Who is the provider or what is the name of the office in which the patient attends annual eye exams?  If pt is not established with a provider, would they like to be referred to a provider to establish care? Yes .   Dental Screening: Recommended annual dental exams for proper oral hygiene  Community Resource Referral / Chronic Care Management: CRR required this visit?  No   CCM required  this visit?  No      Plan:     I have personally reviewed and noted the following in the patient's chart:   Medical and social history Use of alcohol, tobacco or illicit drugs  Current medications and supplements including opioid prescriptions.  Functional ability and status Nutritional status Physical activity Advanced directives List of other physicians Hospitalizations, surgeries, and ER visits in previous 12 months Vitals Screenings to include cognitive, depression, and falls Referrals and appointments  In addition, I have reviewed and discussed with patient certain preventive protocols, quality metrics, and best practice recommendations. A written personalized care plan for preventive services as well as general preventive health recommendations were provided to patient.     Johny Drilling, [place]   09/14/2021   Nurse Notes:  Ms. Awan , Thank you for taking time to come for your Medicare Wellness Visit. I appreciate your ongoing commitment to your health goals. Please review the following plan we discussed and let me know if I can assist you in the future.   These are  the goals we discussed:  Goals      Set My Weight Loss Goal     Wants to lose 50 more lbs     Weight (lb) < 200 lb (90.7 kg)     Would like to lose a little weight and exercise a little more         This is a list of the screening recommended for you and due dates:  Health Maintenance  Topic Date Due   Cologuard (Stool DNA test)  10/05/2020   COVID-19 Vaccine (5 - Moderna series) 02/20/2021   Zoster (Shingles) Vaccine (2 of 2) 06/12/2021   Tetanus Vaccine  04/08/2022*   Flu Shot  09/15/2021   Mammogram  02/03/2023   Pneumonia Vaccine  Completed   DEXA scan (bone density measurement)  Completed   Hepatitis C Screening: USPSTF Recommendation to screen - Ages 36-79 yo.  Completed   HPV Vaccine  Aged Out   Colon Cancer Screening  Discontinued  *Topic was postponed. The date shown is not the original due date.

## 2021-09-14 NOTE — Patient Instructions (Signed)
[name] , Thank you for taking time to come for your Medicare Wellness Visit. I appreciate your ongoing commitment to your health goals. Please review the following plan we discussed and let me know if I can assist you in the future.   Screening recommendations/referrals: Colonoscopy: Completed Mammogram: Completed Bone Density: Completed Recommended yearly ophthalmology/optometry visit for glaucoma screening and checkup Recommended yearly dental visit for hygiene and checkup  Vaccinations: Influenza vaccine: Completed Pneumococcal vaccine: Completed Tdap vaccine: Due Shingles vaccine: get at your local pharmacy     Advanced directives: bring a copy of your living will with you to your next appt.  Conditions/risks identified: falls, hypertension  Next appointment: 1 year   Preventive Care 47 Years and Older, Female Preventive care refers to lifestyle choices and visits with your health care provider that can promote health and wellness. What does preventive care include? A yearly physical exam. This is also called an annual well check. Dental exams once or twice a year. Routine eye exams. Ask your health care provider how often you should have your eyes checked. Personal lifestyle choices, including: Daily care of your teeth and gums. Regular physical activity. Eating a healthy diet. Avoiding tobacco and drug use. Limiting alcohol use. Practicing safe sex. Taking low-dose aspirin every day. Taking vitamin and mineral supplements as recommended by your health care provider. What happens during an annual well check? The services and screenings done by your health care provider during your annual well check will depend on your age, overall health, lifestyle risk factors, and family history of disease. Counseling  Your health care provider may ask you questions about your: Alcohol use. Tobacco use. Drug use. Emotional well-being. Home and relationship  well-being. Sexual activity. Eating habits. History of falls. Memory and ability to understand (cognition). Work and work Statistician. Reproductive health. Screening  You may have the following tests or measurements: Height, weight, and BMI. Blood pressure. Lipid and cholesterol levels. These may be checked every 5 years, or more frequently if you are over 77 years old. Skin check. Lung cancer screening. You may have this screening every year starting at age 27 if you have a 30-pack-year history of smoking and currently smoke or have quit within the past 15 years. Fecal occult blood test (FOBT) of the stool. You may have this test every year starting at age 67. Flexible sigmoidoscopy or colonoscopy. You may have a sigmoidoscopy every 5 years or a colonoscopy every 10 years starting at age 56. Hepatitis C blood test. Hepatitis B blood test. Sexually transmitted disease (STD) testing. Diabetes screening. This is done by checking your blood sugar (glucose) after you have not eaten for a while (fasting). You may have this done every 1-3 years. Bone density scan. This is done to screen for osteoporosis. You may have this done starting at age 54. Mammogram. This may be done every 1-2 years. Talk to your health care provider about how often you should have regular mammograms. Talk with your health care provider about your test results, treatment options, and if necessary, the need for more tests. Vaccines  Your health care provider may recommend certain vaccines, such as: Influenza vaccine. This is recommended every year. Tetanus, diphtheria, and acellular pertussis (Tdap, Td) vaccine. You may need a Td booster every 10 years. Zoster vaccine. You may need this after age 46. Pneumococcal 13-valent conjugate (PCV13) vaccine. One dose is recommended after age 11. Pneumococcal polysaccharide (PPSV23) vaccine. One dose is recommended after age 15. Talk to your health  care provider about which  screenings and vaccines you need and how often you need them. This information is not intended to replace advice given to you by your health care provider. Make sure you discuss any questions you have with your health care provider. Document Released: 02/28/2015 Document Revised: 10/22/2015 Document Reviewed: 12/03/2014 Elsevier Interactive Patient Education  2017 [employer] Prevention in the Home Falls can cause injuries. They can happen to people of all ages. There are many things you can do to make your home safe and to help prevent falls. What can I do on the outside of my home? Regularly fix the edges of walkways and driveways and fix any cracks. Remove anything that might make you trip as you walk through a door, such as a raised step or threshold. Trim any bushes or trees on the path to your home. Use bright outdoor lighting. Clear any walking paths of anything that might make someone trip, such as rocks or tools. Regularly check to see if handrails are loose or broken. Make sure that both sides of any steps have handrails. Any raised decks and porches should have guardrails on the edges. Have any leaves, snow, or ice cleared regularly. Use sand or salt on walking paths during winter. Clean up any spills in your garage right away. This includes oil or grease spills. What can I do in the bathroom? Use night lights. Install grab bars by the toilet and in the tub and shower. Do not use towel bars as grab bars. Use non-skid mats or decals in the tub or shower. If you need to sit down in the shower, use a plastic, non-slip stool. Keep the floor dry. Clean up any water that spills on the floor as soon as it happens. Remove soap buildup in the tub or shower regularly. Attach bath mats securely with double-sided non-slip rug tape. Do not have throw rugs and other things on the floor that can make you trip. What can I do in the bedroom? Use night lights. Make sure that you have a  light by your bed that is easy to reach. Do not use any sheets or blankets that are too big for your bed. They should not hang down onto the floor. Have a firm chair that has side arms. You can use this for support while you get dressed. Do not have throw rugs and other things on the floor that can make you trip. What can I do in the kitchen? Clean up any spills right away. Avoid walking on wet floors. Keep items that you use a lot in easy-to-reach places. If you need to reach something above you, use a strong step stool that has a grab bar. Keep electrical cords out of the way. Do not use floor polish or wax that makes floors slippery. If you must use wax, use non-skid floor wax. Do not have throw rugs and other things on the floor that can make you trip. What can I do with my stairs? Do not leave any items on the stairs. Make sure that there are handrails on both sides of the stairs and use them. Fix handrails that are broken or loose. Make sure that handrails are as long as the stairways. Check any carpeting to make sure that it is firmly attached to the stairs. Fix any carpet that is loose or worn. Avoid having throw rugs at the top or bottom of the stairs. If you do have throw rugs, attach them to  the floor with carpet tape. Make sure that you have a light switch at the top of the stairs and the bottom of the stairs. If you do not have them, ask someone to add them for you. What else can I do to help prevent falls? Wear shoes that: Do not have high heels. Have rubber bottoms. Are comfortable and fit you well. Are closed at the toe. Do not wear sandals. If you use a stepladder: Make sure that it is fully opened. Do not climb a closed stepladder. Make sure that both sides of the stepladder are locked into place. Ask someone to hold it for you, if possible. Clearly mark and make sure that you can see: Any grab bars or handrails. First and last steps. Where the edge of each step  is. Use tools that help you move around (mobility aids) if they are needed. These include: Canes. Walkers. Scooters. Crutches. Turn on the lights when you go into a dark area. Replace any light bulbs as soon as they burn out. Set up your furniture so you have a clear path. Avoid moving your furniture around. If any of your floors are uneven, fix them. If there are any pets around you, be aware of where they are. Review your medicines with your doctor. Some medicines can make you feel dizzy. This can increase your chance of falling. Ask your doctor what other things that you can do to help prevent falls. This information is not intended to replace advice given to you by your health care provider. Make sure you discuss any questions you have with your health care provider. Document Released: 11/28/2008 Document Revised: 07/10/2015 Document Reviewed: 03/08/2014 Elsevier Interactive Patient Education  2017 [employer].

## 2021-09-23 ENCOUNTER — Encounter: Payer: Self-pay | Admitting: Nutrition

## 2021-09-23 ENCOUNTER — Encounter: Payer: Medicare HMO | Attending: Family Medicine | Admitting: Nutrition

## 2021-09-23 DIAGNOSIS — R7303 Prediabetes: Secondary | ICD-10-CM | POA: Insufficient documentation

## 2021-09-23 DIAGNOSIS — E785 Hyperlipidemia, unspecified: Secondary | ICD-10-CM | POA: Insufficient documentation

## 2021-09-23 DIAGNOSIS — I10 Essential (primary) hypertension: Secondary | ICD-10-CM | POA: Insufficient documentation

## 2021-09-23 NOTE — Patient Instructions (Signed)
Goals  Get rid of sweet tea or other sugar foods Exercise 30 minutes of chair exercises 4 times per week. Choose more plant based foods. Lose 1-2 lbs per week

## 2021-09-23 NOTE — Progress Notes (Signed)
Medical Nutrition Therapy   Primary concerns today: Obesity and Prediabetes   Begin 1330 End 1400   Referral diagnosis: e66.01 and R73.03 Preferred learning style: No preferences Learning readiness: change in process    NUTRITION ASSESSMENT  72 y r old bfemale with  Morbid Obesity and prediabetes. PCP Dr. Moshe Cipro. BMI 46. Since seeing Dr. Moshe Cipro, she has been cutting  out a lot sugars. Has been working on eating more fruits and vegetables. Lost 3 lbs. Feels much better. Has more energy. Not checking blood sugars since she isn't a diabetic technically. No recent A1C to evaluate. Says iher A1C will be checked at next PCP visit.  Her diet history has been excessive in calories and contributing to her weight gain and risk for Type 2 DM and other complications.  She is wiling to work on lifestyle medicine to improve her health and provided needed weight loss.  Lab Results  Component Value Date   HGBA1C 5.3 01/12/2021   Anthropometrics  Wt Readings from Last 3 Encounters:  09/23/21 296 lb (134.3 kg)  06/22/21 299 lb (135.6 kg)  04/16/21 (!) 301 lb (136.5 kg)   Ht Readings from Last 3 Encounters:  09/23/21 '5\' 7"'$  (1.702 m)  06/22/21 '5\' 7"'$  (1.702 m)  04/16/21 '5\' 7"'$  (1.702 m)   Body mass index is 46.36 kg/m. '@BMIFA'$ @ Facility age limit for growth %iles is 20 years. Facility age limit for growth %iles is 20 years.    Clinical Medical Hx: HTN, Hyperlipidemia, Arthritis, allergies,  Medications: see chart Labs: A1C 5.3%    Latest Ref Rng & Units 01/12/2021   10:06 AM 12/16/2020    9:40 AM 08/26/2020    8:07 AM  CMP  Glucose 70 - 99 mg/dL 94  107  94   BUN 8 - 27 mg/dL '13  18  19   '$ Creatinine 0.57 - 1.00 mg/dL 0.90  1.06  1.10   Sodium 134 - 144 mmol/L 142  141  140   Potassium 3.5 - 5.2 mmol/L 3.5  3.6  3.5   Chloride 96 - 106 mmol/L 100  96  96   CO2 20 - 29 mmol/L '27  27  27   '$ Calcium 8.7 - 10.3 mg/dL 9.2  9.9  9.5   Total Protein 6.0 - 8.5 g/dL 6.8   7.1   Total  Bilirubin 0.0 - 1.2 mg/dL 0.3   0.5   Alkaline Phos 44 - 121 IU/L 104   113   AST 0 - 40 IU/L 23   26   ALT 0 - 32 IU/L 17   21    Lipid Panel     Component Value Date/Time   CHOL 155 01/12/2021 1006   TRIG 128 01/12/2021 1006   HDL 41 01/12/2021 1006   CHOLHDL 3.8 01/12/2021 1006   CHOLHDL 4.4 07/12/2019 1155   VLDL 25 09/01/2016 1221   LDLCALC 91 01/12/2021 1006   LDLCALC 124 (H) 07/12/2019 1155   LABVLDL 23 01/12/2021 1006   Lab Results  Component Value Date   HGBA1C 5.3 01/12/2021    Notable Signs/Symptoms: none  Lifestyle & Dietary Hx LIves by herself. Eats out 25%, but eats at home 75%.  Estimated daily fluid intake: 80 oz Supplements: MVI, Black seed oil, tumeric Sleep: 7-8 hrs Stress / self-care: none Current average weekly physical activity: Knee problems  24-Hr Dietary Recall First Meal: yogurt, water Snack:  Second Meal: string beans, broccoli, cabbage and 2 spoons of potatoes, baked chicken, fish, water  Dinner: Same as lunch, water Watermelon and cantelope. Snack:  ....................................................................................  Estimated Energy Needs Calories: 1200 Carbohydrate: 135g Protein: 90g Fat: 33g   NUTRITION DIAGNOSIS  NI-1.5 Excessive energy intake As related to Obesity.  As evidenced by Current diet and BMI 46.   NUTRITION INTERVENTION  Nutrition education (E-1) on the following topics:  Lifestyle Medicine - Whole Food, Plant Predominant Nutrition is highly recommended: Eat Plenty of vegetables, Mushrooms, fruits, Legumes, Whole Grains, Nuts, seeds in lieu of processed meats, processed snacks/pastries red meat, poultry, eggs.    -It is better to avoid simple carbohydrates including: Cakes, Sweet Desserts, Ice Cream, Soda (diet and regular), Sweet Tea, Candies, Chips, Cookies, Store Bought Juices, Alcohol in Excess of  1-2 drinks a day, Lemonade,  Artificial Sweeteners, Doughnuts, Coffee Creamers, "Sugar-free"  Products, etc, etc.  This is not a complete list.....  Exercise: If you are able: 30 -60 minutes a day ,4 days a week, or 150 minutes a week.  The longer the better.  Combine stretch, strength, and aerobic activities.  If you were told in the past that you have high risk for cardiovascular diseases, you may seek evaluation by your heart doctor prior to initiating moderate to intense exercise programs.  Weight Loss Tips  #1 Rule: Track what you eat by keeping a food journal. Knowing what, when and how much we eat provides awareness. Being aware of what we're eating can help [place] see how many calories we're eating, what quality of calories we're consuming and what foods we're not eating enough of. It takes a deficient of 3500 calories per week to lose 1 lb. To lose 1 lb per week, reduce calorie intake or increase exercise to the equivalent of 500 calories per day. #2 Rule: Drink ONLY water. 1 oz per lb of body weight. If you weigh 200 lbs, you need to drink 200 oz of water per day.   Handouts Provided Include  Plant based meal plan  Meal Plan card Calorie density of foods.  Learning Style & Readiness for Change Teaching method utilized: Visual & Auditory  Demonstrated degree of understanding via: Teach Back  Barriers to learning/adherence to lifestyle change: none  Goals Established by Pt  Get rid of sweet tea or other sugar foods Exercise 30 minutes of chair exercises 4 times per week. Choose more plant based foods. Lose 1-2 lbs per week   Focus on plant based foods Go to Lifestyle.org for more ideas on plant based foods. Watch "[employer]" Lose 1-2 lbs per week.   MONITORING & EVALUATION Dietary intake, weekly physical activity, and weight  in 1 month.  Next Steps  Patient is to work on meal planning, keeping a food journal and exercise.Marland Kitchen

## 2021-09-29 ENCOUNTER — Encounter: Payer: Self-pay | Admitting: Nutrition

## 2021-10-16 ENCOUNTER — Ambulatory Visit: Payer: Medicare HMO | Admitting: Family Medicine

## 2021-10-20 ENCOUNTER — Ambulatory Visit (INDEPENDENT_AMBULATORY_CARE_PROVIDER_SITE_OTHER): Payer: Medicare HMO | Admitting: Family Medicine

## 2021-10-20 ENCOUNTER — Encounter: Payer: Self-pay | Admitting: Family Medicine

## 2021-10-20 VITALS — BP 121/72 | HR 77 | Ht 67.0 in | Wt 298.1 lb

## 2021-10-20 DIAGNOSIS — G8929 Other chronic pain: Secondary | ICD-10-CM

## 2021-10-20 DIAGNOSIS — I10 Essential (primary) hypertension: Secondary | ICD-10-CM | POA: Diagnosis not present

## 2021-10-20 DIAGNOSIS — M25561 Pain in right knee: Secondary | ICD-10-CM | POA: Diagnosis not present

## 2021-10-20 DIAGNOSIS — R7303 Prediabetes: Secondary | ICD-10-CM | POA: Diagnosis not present

## 2021-10-20 DIAGNOSIS — E785 Hyperlipidemia, unspecified: Secondary | ICD-10-CM | POA: Diagnosis not present

## 2021-10-20 DIAGNOSIS — E559 Vitamin D deficiency, unspecified: Secondary | ICD-10-CM | POA: Diagnosis not present

## 2021-10-20 MED ORDER — SEMAGLUTIDE-WEIGHT MANAGEMENT 0.25 MG/0.5ML ~~LOC~~ SOAJ
0.2500 mg | SUBCUTANEOUS | 0 refills | Status: DC
Start: 2021-10-20 — End: 2022-02-23

## 2021-10-20 NOTE — Assessment & Plan Note (Signed)
Hyperlipidemia:Low fat diet discussed and encouraged.   Lipid Panel  Lab Results  Component Value Date   CHOL 155 01/12/2021   HDL 41 01/12/2021   LDLCALC 91 01/12/2021   TRIG 128 01/12/2021   CHOLHDL 3.8 01/12/2021   Updated lab needed at/ before next visit.

## 2021-10-20 NOTE — Assessment & Plan Note (Addendum)
  Patient re-educated about  the importance of commitment to a  minimum of 150 minutes of exercise per week as able. wegovy prescribed  The importance of healthy food choices with portion control discussed, as well as eating regularly and within a 12 hour window most days. The need to choose "clean , green" food 50 to 75% of the time is discussed, as well as to make water the primary drink and set a goal of 64 ounces water daily.       10/20/2021   10:06 AM 09/23/2021    1:28 PM 06/22/2021   11:19 AM  Weight /BMI  Weight 298 lb 1.9 oz 296 lb 299 lb  Height '5\' 7"'$  (1.702 m) '5\' 7"'$  (1.702 m) '5\' 7"'$  (1.702 m)  BMI 46.69 kg/m2 46.36 kg/m2 46.83 kg/m2

## 2021-10-20 NOTE — Assessment & Plan Note (Signed)
Ongoing with crepitus,and instability,  needs surgery working on weight loss

## 2021-10-20 NOTE — Patient Instructions (Addendum)
Annual exam first week in December, call if you need me sooner  Cologuard can be sent 12 months after FIT test, pls verify with your insurance that they will pay for it, they should  Wegovy weekly is prescribed for weight loss , let me know if a problem getting it, I will need to refill or increase the dose befor December , I only  sent 1 month  Labs today already ordered  Pls schedule mammogram at checkout  Meal Prep  Intermittent fasting  64 ounces water daily  Thanks for choosing [facility], we consider it a privelige to serve you.

## 2021-10-20 NOTE — Assessment & Plan Note (Signed)
Controlled, no change in medication DASH diet and commitment to daily physical activity for a minimum of 30 minutes discussed and encouraged, as a part of hypertension management. The importance of attaining a healthy weight is also discussed.     10/20/2021   10:06 AM 09/23/2021    1:28 PM 06/22/2021   11:19 AM 04/16/2021   10:08 AM 04/15/2021   10:17 AM 03/27/2021   11:57 AM 03/18/2021    2:31 PM  BP/Weight  Systolic BP 416   384 536    Diastolic BP 72   90 78    Wt. (Lbs) 298.12 296 299 301 301 299 299.6  BMI 46.69 kg/m2 46.36 kg/m2 46.83 kg/m2 47.14 kg/m2 47.14 kg/m2 46.83 kg/m2 46.92 kg/m2

## 2021-10-20 NOTE — Progress Notes (Signed)
Michele Lowe     MRN: [id number]      DOB: Jul 22, 1950   HPI Michele Lowe is here for follow up and re-evaluation of chronic medical conditions, medication management and review of any available recent lab and radiology data.  Preventive health is updated, specifically  Cancer screening and Immunization.   Has right knee surgery in the plan, needs it , she is unstable, however needs to lose an additional 60 pounds. The PT denies any adverse reactions to current medications since the last visit.  ROS Denies recent fever or chills. Denies sinus pressure, nasal congestion, ear pain or sore throat. Denies chest congestion, productive cough or wheezing. Denies chest pains, palpitations and leg swelling Denies abdominal pain, nausea, vomiting,diarrhea or constipation.   Denies dysuria, frequency, hesitancy or incontinence.  Denies headaches, seizures, numbness, or tingling. Denies depression, anxiety or insomnia. Denies skin break down or rash.   PE  BP 121/72 (BP Location: Right Arm, Patient Position: Sitting, Cuff Size: Large)   Pulse 77   Ht '5\' 7"'$  (1.702 m)   Wt 298 lb 1.9 oz (135.2 kg)   SpO2 96%   BMI 46.69 kg/m   Patient alert and oriented and in no cardiopulmonary distress.  HEENT: No facial asymmetry, EOMI,     Neck supple .  Chest: Clear to auscultation bilaterally.  CVS: S1, S2 no murmurs, no S3.Regular rate.  ABD: Soft non tender.   Ext: No edema  MS: Adequate ROM spine, shoulders, hips and reduced in right knee.  Skin: Intact, no ulcerations or rash noted.  Psych: Good eye contact, normal affect. Memory intact not anxious or depressed appearing.  CNS: CN 2-12 intact, power,  normal throughout.no focal deficits noted.   Assessment & Plan  Essential hypertension Controlled, no change in medication DASH diet and commitment to daily physical activity for a minimum of 30 minutes discussed and encouraged, as a part of hypertension management. The  importance of attaining a healthy weight is also discussed.     10/20/2021   10:06 AM 09/23/2021    1:28 PM 06/22/2021   11:19 AM 04/16/2021   10:08 AM 04/15/2021   10:17 AM 03/27/2021   11:57 AM 03/18/2021    2:31 PM  BP/Weight  Systolic BP 629   528 413    Diastolic BP 72   90 78    Wt. (Lbs) 298.12 296 299 301 301 299 299.6  BMI 46.69 kg/m2 46.36 kg/m2 46.83 kg/m2 47.14 kg/m2 47.14 kg/m2 46.83 kg/m2 46.92 kg/m2       Hyperlipidemia LDL goal <100 Hyperlipidemia:Low fat diet discussed and encouraged.   Lipid Panel  Lab Results  Component Value Date   CHOL 155 01/12/2021   HDL 41 01/12/2021   LDLCALC 91 01/12/2021   TRIG 128 01/12/2021   CHOLHDL 3.8 01/12/2021   Updated lab needed at/ before next visit.     MORBID OBESITY  Patient re-educated about  the importance of commitment to a  minimum of 150 minutes of exercise per week as able. wegovy prescribed  The importance of healthy food choices with portion control discussed, as well as eating regularly and within a 12 hour window most days. The need to choose "clean , green" food 50 to 75% of the time is discussed, as well as to make water the primary drink and set a goal of 64 ounces water daily.       10/20/2021   10:06 AM 09/23/2021    1:28 PM 06/22/2021  11:19 AM  Weight /BMI  Weight 298 lb 1.9 oz 296 lb 299 lb  Height '5\' 7"'$  (1.702 m) '5\' 7"'$  (1.702 m) '5\' 7"'$  (1.702 m)  BMI 46.69 kg/m2 46.36 kg/m2 46.83 kg/m2      Knee pain, right Ongoing with crepitus,and instability,  needs surgery working on weight loss

## 2021-10-21 LAB — CBC
Hematocrit: 37 % (ref 34.0–46.6)
Hemoglobin: 12 g/dL (ref 11.1–15.9)
MCH: 27.6 pg (ref 26.6–33.0)
MCHC: 32.4 g/dL (ref 31.5–35.7)
MCV: 85 fL (ref 79–97)
Platelets: 206 10*3/uL (ref 150–450)
RBC: 4.34 x10E6/uL (ref 3.77–5.28)
RDW: 13.2 % (ref 11.7–15.4)
WBC: 3.7 10*3/uL (ref 3.4–10.8)

## 2021-10-21 LAB — CMP14+EGFR
ALT: 12 IU/L (ref 0–32)
AST: 28 IU/L (ref 0–40)
Albumin/Globulin Ratio: 1.5 (ref 1.2–2.2)
Albumin: 4 g/dL (ref 3.8–4.8)
Alkaline Phosphatase: 110 IU/L (ref 44–121)
BUN/Creatinine Ratio: 23 (ref 12–28)
BUN: 21 mg/dL (ref 8–27)
Bilirubin Total: 0.6 mg/dL (ref 0.0–1.2)
CO2: 24 mmol/L (ref 20–29)
Calcium: 9.4 mg/dL (ref 8.7–10.3)
Chloride: 100 mmol/L (ref 96–106)
Creatinine, Ser: 0.92 mg/dL (ref 0.57–1.00)
Globulin, Total: 2.7 g/dL (ref 1.5–4.5)
Glucose: 91 mg/dL (ref 70–99)
Potassium: 3.6 mmol/L (ref 3.5–5.2)
Sodium: 143 mmol/L (ref 134–144)
Total Protein: 6.7 g/dL (ref 6.0–8.5)
eGFR: 66 mL/min/{1.73_m2} (ref >59)

## 2021-10-21 LAB — LIPID PANEL
Chol/HDL Ratio: 3.5 ratio (ref 0.0–4.4)
Cholesterol, Total: 153 mg/dL (ref 100–199)
HDL: 44 mg/dL (ref >39)
LDL Chol Calc (NIH): 86 mg/dL (ref 0–99)
Triglycerides: 127 mg/dL (ref 0–149)
VLDL Cholesterol Cal: 23 mg/dL (ref 5–40)

## 2021-10-21 LAB — HEMOGLOBIN A1C
Est. average glucose Bld gHb Est-mCnc: 111 mg/dL
Hgb A1c MFr Bld: 5.5 % (ref 4.8–5.6)

## 2021-10-21 LAB — VITAMIN D 25 HYDROXY (VIT D DEFICIENCY, FRACTURES): Vit D, 25-Hydroxy: 65.9 ng/mL (ref 30.0–100.0)

## 2021-10-21 LAB — TSH: TSH: 2.98 u[IU]/mL (ref 0.450–4.500)

## 2021-10-22 DIAGNOSIS — I10 Essential (primary) hypertension: Secondary | ICD-10-CM

## 2021-10-29 ENCOUNTER — Other Ambulatory Visit: Payer: Self-pay | Admitting: Family Medicine

## 2021-11-23 ENCOUNTER — Other Ambulatory Visit: Payer: Self-pay | Admitting: Family Medicine

## 2021-12-28 ENCOUNTER — Encounter: Payer: Medicare HMO | Attending: Family Medicine | Admitting: Nutrition

## 2021-12-28 ENCOUNTER — Encounter: Payer: Self-pay | Admitting: Nutrition

## 2021-12-28 VITALS — Ht 67.0 in | Wt 299.0 lb

## 2021-12-28 DIAGNOSIS — Z713 Dietary counseling and surveillance: Secondary | ICD-10-CM | POA: Insufficient documentation

## 2021-12-28 DIAGNOSIS — R7303 Prediabetes: Secondary | ICD-10-CM | POA: Diagnosis not present

## 2021-12-28 DIAGNOSIS — E785 Hyperlipidemia, unspecified: Secondary | ICD-10-CM | POA: Diagnosis not present

## 2021-12-28 DIAGNOSIS — I10 Essential (primary) hypertension: Secondary | ICD-10-CM | POA: Diagnosis not present

## 2021-12-28 NOTE — Progress Notes (Signed)
Medical Nutrition Therapy   Primary concerns today: Obesity and Prediabetes   Begin 0830 End 0900   Referral diagnosis: e66.01 and R73.03 Preferred learning style: No preferences Learning readiness: change in process    NUTRITION ASSESSMENT Follow up 64 y r old bfemale with  Morbid Obesity and prediabetes. PCP Dr. Moshe Cipro.  Previous goals set: Get rid of sweet tea or other sugar foods-Done Exercise 30 minutes of chair exercises 4 times per week- doing 3 days per week. Choose more plant based foods- Done great Lose 1-2 lbs per week- Gained 3 lbs since last visit with me.   Today's visit: Hasn't  gotten the weight loss injection yet due to back order. She notes she would rather not use the injection and would prefer to get her weight off and keep it off with lifestyle medicine.  Has been increasing more fruits, vegetables, Eating a lot more salads. Drinking more water now. However, she has been slipping in some sweet tea at times. Has cut back overall and using less sugar. Discussed the calories consumed and impact on desired weight loss. Been exercising and has been being more mindfdul. Works out 3 times per week.  Clothes are fitting better. Feels better. Not as bloated. Sleeping better.  BMI 46.  She is wiling to work on lifestyle medicine to improve her health and provided needed weight loss.  Lab Results  Component Value Date   HGBA1C 5.5 10/20/2021   Anthropometrics  Wt Readings from Last 3 Encounters:  10/20/21 298 lb 1.9 oz (135.2 kg)  09/23/21 296 lb (134.3 kg)  06/22/21 299 lb (135.6 kg)   Ht Readings from Last 3 Encounters:  10/20/21 '5\' 7"'$  (1.702 m)  09/23/21 '5\' 7"'$  (1.702 m)  06/22/21 '5\' 7"'$  (1.702 m)   There is no height or weight on file to calculate BMI. '@BMIFA'$ @ Facility age limit for growth %iles is 20 years. Facility age limit for growth %iles is 20 years.    Clinical Medical Hx: HTN, Hyperlipidemia, Arthritis, allergies,  Medications: see  chart Labs: A1C 5.3%    Latest Ref Rng & Units 10/20/2021   11:42 AM 01/12/2021   10:06 AM 12/16/2020    9:40 AM  CMP  Glucose 70 - 99 mg/dL 91  94  107   BUN 8 - 27 mg/dL '21  13  18   '$ Creatinine 0.57 - 1.00 mg/dL 0.92  0.90  1.06   Sodium 134 - 144 mmol/L 143  142  141   Potassium 3.5 - 5.2 mmol/L 3.6  3.5  3.6   Chloride 96 - 106 mmol/L 100  100  96   CO2 20 - 29 mmol/L '24  27  27   '$ Calcium 8.7 - 10.3 mg/dL 9.4  9.2  9.9   Total Protein 6.0 - 8.5 g/dL 6.7  6.8    Total Bilirubin 0.0 - 1.2 mg/dL 0.6  0.3    Alkaline Phos 44 - 121 IU/L 110  104    AST 0 - 40 IU/L 28  23    ALT 0 - 32 IU/L 12  17     Lipid Panel     Component Value Date/Time   CHOL 153 10/20/2021 1142   TRIG 127 10/20/2021 1142   HDL 44 10/20/2021 1142   CHOLHDL 3.5 10/20/2021 1142   CHOLHDL 4.4 07/12/2019 1155   VLDL 25 09/01/2016 1221   LDLCALC 86 10/20/2021 1142   LDLCALC 124 (H) 07/12/2019 1155   LABVLDL 23 10/20/2021 1142  Lab Results  Component Value Date   HGBA1C 5.5 10/20/2021    Notable Signs/Symptoms: none  Lifestyle & Dietary Hx LIves by herself. Eats out 25%, but eats at home 75%.  Estimated daily fluid intake: 80 oz Supplements: MVI, Black seed oil, tumeric Sleep: 7-8 hrs Stress / self-care: none Current average weekly physical activity: Knee problems  24-Hr Dietary Recall First Meal: Cream of wheat, butter or shredded wheat or raisin bran, almond Snack:  Second Meal: Spring mix, with extra spinach-onions, carrots, celery, tomatoes,          Dinner:  Big salad, coleslaw, baked fish, sweet potato,, sweet tea, water strawberries, blueberries,  Snack: apple, ....................................................................................  Estimated Energy Needs Calories: 1200 Carbohydrate: 135g Protein: 90g Fat: 33g   NUTRITION DIAGNOSIS  NI-1.5 Excessive energy intake As related to Obesity.  As evidenced by Current diet and BMI 46.   NUTRITION INTERVENTION  Nutrition  education (E-1) on the following topics:  Lifestyle Medicine - Whole Food, Plant Predominant Nutrition is highly recommended: Eat Plenty of vegetables, Mushrooms, fruits, Legumes, Whole Grains, Nuts, seeds in lieu of processed meats, processed snacks/pastries red meat, poultry, eggs.    -It is better to avoid simple carbohydrates including: Cakes, Sweet Desserts, Ice Cream, Soda (diet and regular), Sweet Tea, Candies, Chips, Cookies, Store Bought Juices, Alcohol in Excess of  1-2 drinks a day, Lemonade,  Artificial Sweeteners, Doughnuts, Coffee Creamers, "Sugar-free" Products, etc, etc.  This is not a complete list.....  Exercise: If you are able: 30 -60 minutes a day ,4 days a week, or 150 minutes a week.  The longer the better.  Combine stretch, strength, and aerobic activities.  If you were told in the past that you have high risk for cardiovascular diseases, you may seek evaluation by your heart doctor prior to initiating moderate to intense exercise programs.  Weight Loss Tips  #1 Rule: Track what you eat by keeping a food journal. Knowing what, when and how much we eat provides awareness. Being aware of what we're eating can help [place] see how many calories we're eating, what quality of calories we're consuming and what foods we're not eating enough of. It takes a deficient of 3500 calories per week to lose 1 lb. To lose 1 lb per week, reduce calorie intake or increase exercise to the equivalent of 500 calories per day. #2 Rule: Drink ONLY water. 1 oz per lb of body weight. If you weigh 200 lbs, you need to drink 200 oz of water per day.   Handouts Provided Include  Plant based meal plan  Meal Plan card Calorie density of foods.  Learning Style & Readiness for Change Teaching method utilized: Visual & Auditory  Demonstrated degree of understanding via: Teach Back  Barriers to learning/adherence to lifestyle change: none  Goals Established by Pt  Goals Look up recipes for plant based  foods-utube, Watch Forks over [employer] rid of sweet tea and only drink water Increase exercise as tolerated Watch the [employer] Lose 2 lbs per month   MONITORING & EVALUATION Dietary intake, weekly physical activity, and weight  in 3 month.  Next Steps  Patient is to work on meal planning, keeping a food journal and exercise.Marland Kitchen

## 2021-12-28 NOTE — Patient Instructions (Signed)
  Goals Look up recipes for plant based foods-utube, Watch [place] over [employer] rid of sweet tea and only drink water Increase exercise as tolerated Watch the [email or web address] Lose 2 lbs per month

## 2021-12-29 ENCOUNTER — Ambulatory Visit: Payer: Medicare HMO | Admitting: Nutrition

## 2022-01-20 ENCOUNTER — Encounter (HOSPITAL_COMMUNITY): Payer: Self-pay | Admitting: *Deleted

## 2022-01-20 ENCOUNTER — Emergency Department (HOSPITAL_COMMUNITY): Payer: Medicare HMO

## 2022-01-20 ENCOUNTER — Encounter: Payer: Self-pay | Admitting: Family Medicine

## 2022-01-20 ENCOUNTER — Emergency Department (HOSPITAL_COMMUNITY): Admission: RE | Admit: 2022-01-20 | Payer: Medicare HMO | Source: Ambulatory Visit

## 2022-01-20 ENCOUNTER — Encounter (HOSPITAL_COMMUNITY): Payer: Self-pay

## 2022-01-20 ENCOUNTER — Ambulatory Visit (INDEPENDENT_AMBULATORY_CARE_PROVIDER_SITE_OTHER): Payer: Medicare HMO | Admitting: Family Medicine

## 2022-01-20 ENCOUNTER — Emergency Department (HOSPITAL_COMMUNITY)
Admission: EM | Admit: 2022-01-20 | Discharge: 2022-01-20 | Disposition: A | Discharge status: Home / Self Care | Payer: Medicare HMO | Attending: Emergency Medicine | Admitting: Emergency Medicine

## 2022-01-20 ENCOUNTER — Other Ambulatory Visit: Payer: Self-pay

## 2022-01-20 VITALS — BP 120/72 | HR 89 | Ht 67.0 in | Wt 286.1 lb

## 2022-01-20 DIAGNOSIS — I10 Essential (primary) hypertension: Secondary | ICD-10-CM | POA: Diagnosis not present

## 2022-01-20 DIAGNOSIS — M7989 Other specified soft tissue disorders: Secondary | ICD-10-CM

## 2022-01-20 DIAGNOSIS — Z23 Encounter for immunization: Secondary | ICD-10-CM | POA: Diagnosis not present

## 2022-01-20 DIAGNOSIS — L03116 Cellulitis of left lower limb: Secondary | ICD-10-CM | POA: Insufficient documentation

## 2022-01-20 DIAGNOSIS — I872 Venous insufficiency (chronic) (peripheral): Secondary | ICD-10-CM

## 2022-01-20 DIAGNOSIS — M79662 Pain in left lower leg: Secondary | ICD-10-CM | POA: Diagnosis not present

## 2022-01-20 DIAGNOSIS — M79605 Pain in left leg: Secondary | ICD-10-CM | POA: Diagnosis not present

## 2022-01-20 MED ORDER — TRAMADOL HCL 50 MG PO TABS
50.0000 mg | ORAL_TABLET | Freq: Once | ORAL | Status: AC
  Administered 2022-01-20: 50 mg via ORAL
  Filled 2022-01-20: qty 1

## 2022-01-20 MED ORDER — ACETAMINOPHEN ER 650 MG PO TBCR: 650.0000 mg | EXTENDED_RELEASE_TABLET | Freq: Three times a day (TID) | ORAL | 0 refills | Status: AC

## 2022-01-20 MED ORDER — TRAMADOL HCL 50 MG PO TABS: 50.0000 mg | ORAL_TABLET | Freq: Four times a day (QID) | ORAL | 0 refills | Status: AC | PRN

## 2022-01-20 MED ORDER — CEPHALEXIN 500 MG PO CAPS: 500.0000 mg | ORAL_CAPSULE | Freq: Three times a day (TID) | ORAL | 0 refills | Status: DC

## 2022-01-20 NOTE — Assessment & Plan Note (Addendum)
1 week history, venous doppler stat, cBc and diff , presumed cellulitis

## 2022-01-20 NOTE — ED Provider Notes (Signed)
[facility] EMERGENCY DEPARTMENT Provider Note   CSN: [id number] Arrival date & time: 01/20/22  0941     History  Chief Complaint  Patient presents with   Leg Pain    Michele Lowe is a 71 y.o. female presenting for evaluation of pain and swelling of her left lateral calf over the past 5 days. She was seen by her pcp this am and prescribed antibiotics as there is some redness and concern for possible infection, but was sent here also for an ultasound study to rule out dvt.  Pt denies any injury or falls and has had no prior history of hypercoagulability.  She has had no fevers or chills, no cp or sob.  Has tried rest and elevation without relief.  The history is provided by the patient.       Home Medications Prior to Admission medications   Medication Sig Start Date End Date Taking? Authorizing Provider  acetaminophen (TYLENOL 8 HOUR) 650 MG CR tablet Take 1 tablet (650 mg total) by mouth every 8 (eight) hours. 01/20/22  Yes Fayrene Helper, MD  Black Currant Seed Oil 500 MG CAPS Take by mouth.   Yes [provider]  calcium-vitamin D (OSCAL WITH D) 500-200 MG-UNIT tablet Take 1 tablet by mouth 2 (two) times daily. 12/04/19  Yes Fayrene Helper, MD  furosemide (LASIX) 40 MG tablet TAKE 1 TABLET EVERY DAY 11/23/21  Yes Fayrene Helper, MD  loratadine (CLARITIN) 10 MG tablet Take 1 tablet (10 mg total) by mouth daily. 05/30/12  Yes Fayrene Helper, MD  Multiple Vitamin (MULTIVITAMIN PO) Take by mouth daily.   Yes [provider]  Omega-3 Fatty Acids (FISH OIL) 1000 MG CAPS Take 1 capsule by mouth daily.   Yes [provider]  OVER THE COUNTER MEDICATION Tumeric 2250 mg  1 - 2 per day   Yes [provider]  OVER THE COUNTER MEDICATION glucosamine 1500 mg Chondroitin 1200 mg  one daily   Yes [provider]  OVER THE COUNTER MEDICATION Aspercreme with 4% lidocaine roll on liquid. Apply to right knee for pain as needed.   Yes  [provider]  potassium chloride SA (KLOR-CON M) 20 MEQ tablet TAKE 1 TABLET EVERY DAY 10/29/21  Yes Fayrene Helper, MD  rosuvastatin (CRESTOR) 5 MG tablet Take one tablet by mouth every Monday, Wednesday and Friday 04/15/21  Yes Fayrene Helper, MD  triamterene-hydrochlorothiazide (MAXZIDE-25) 37.5-25 MG tablet TAKE 1 AND 1/2 TABLETS ONE TIME DAILY 09/08/21  Yes Fayrene Helper, MD  cephALEXin (KEFLEX) 500 MG capsule Take 1 capsule (500 mg total) by mouth 3 (three) times daily. Patient not taking: Reported on 01/20/2022 01/20/22   Fayrene Helper, MD  Semaglutide-Weight Management 0.25 MG/0.5ML SOAJ Inject 0.25 mg into the skin once a week. Patient not taking: Reported on 01/20/2022 10/20/21   Fayrene Helper, MD      Allergies    Codeine, Influenza vaccines, and Oxycodone hcl    Review of Systems   Review of Systems  Constitutional:  Negative for chills and fever.  HENT:  Negative for congestion.   Eyes: Negative.   Respiratory:  Negative for chest tightness and shortness of breath.   Cardiovascular:  Positive for leg swelling. Negative for chest pain.  Gastrointestinal:  Negative for abdominal pain, nausea and vomiting.  Genitourinary: Negative.   Musculoskeletal:  Positive for arthralgias and joint swelling. Negative for neck pain.       Right  knee pain, anticipating arthroplasty.  Skin:  Positive for color change. Negative for rash and wound.  Neurological:  Negative for dizziness, weakness, light-headedness, numbness and headaches.  Psychiatric/Behavioral: Negative.      Physical Exam Updated Vital Signs BP (!) 136/95   Pulse 90   Temp 97.7 F (36.5 C) (Oral)   Resp 16   Ht '5\' 7"'$  (1.702 m)   Wt 129.7 kg   SpO2 98%   BMI 44.78 kg/m  Physical Exam Vitals and nursing note reviewed.  Constitutional:      Appearance: She is well-developed.  HENT:     Head: Normocephalic and atraumatic.  Eyes:     Conjunctiva/sclera: Conjunctivae normal.   Cardiovascular:     Rate and Rhythm: Normal rate.  Pulmonary:     Effort: Pulmonary effort is normal.  Musculoskeletal:        General: Normal range of motion.     Cervical back: Normal range of motion.     Left lower leg: Swelling and tenderness present.     Comments: Tender lateral upper calf.  Positive homans left.  Pedal pulses intact.   Skin:    General: Skin is warm and dry.     Findings: Erythema present.     Comments: Mild erythema left lateral upper calf.  No significant increased warmth.   Neurological:     General: No focal deficit present.     Mental Status: She is alert.  Psychiatric:        Mood and Affect: Mood normal.     ED Results / Procedures / Treatments   Labs (all labs ordered are listed, but only abnormal results are displayed) Labs Reviewed - No data to display  EKG None  Radiology US Venous Img Lower Bilateral (DVT)  Result Date: 01/20/2022 CLINICAL DATA:  Left lower extremity edema and pain. EXAM: BILATERAL LOWER EXTREMITY VENOUS DOPPLER ULTRASOUND TECHNIQUE: Gray-scale sonography with graded compression, as well as color Doppler and duplex ultrasound were performed to evaluate the lower extremity deep venous systems from the level of the common femoral vein and including the common femoral, femoral, profunda femoral, popliteal and calf veins including the posterior tibial, peroneal and gastrocnemius veins when visible. The superficial great saphenous vein was also interrogated. Spectral Doppler was utilized to evaluate flow at rest and with distal augmentation maneuvers in the common femoral, femoral and popliteal veins. COMPARISON:  None Available. FINDINGS: RIGHT LOWER EXTREMITY Common Femoral Vein: No evidence of thrombus. Normal compressibility, respiratory phasicity and response to augmentation. Saphenofemoral Junction: No evidence of thrombus. Normal compressibility and flow on color Doppler imaging. Profunda Femoral Vein: No evidence of thrombus.  Normal compressibility and flow on color Doppler imaging. Femoral Vein: No evidence of thrombus. Normal compressibility, respiratory phasicity and response to augmentation. Popliteal Vein: No evidence of thrombus. Normal compressibility, respiratory phasicity and response to augmentation. Calf Veins: No evidence of thrombus. Normal compressibility and flow on color Doppler imaging. Superficial Great Saphenous Vein: No evidence of thrombus. Normal compressibility. Venous Reflux:  None. Other Findings: No evidence of superficial thrombophlebitis or abnormal fluid collection. LEFT LOWER EXTREMITY Common Femoral Vein: No evidence of thrombus. Normal compressibility, respiratory phasicity and response to augmentation. Saphenofemoral Junction: No evidence of thrombus. Normal compressibility and flow on color Doppler imaging. Profunda Femoral Vein: No evidence of thrombus. Normal compressibility and flow on color Doppler imaging. Femoral Vein: No evidence of thrombus. Normal compressibility, respiratory phasicity and response to augmentation. Popliteal Vein: No evidence of thrombus. Normal compressibility, respiratory phasicity  and response to augmentation. Calf Veins: No evidence of thrombus. Normal compressibility and flow on color Doppler imaging. Superficial Great Saphenous Vein: No evidence of thrombus. Normal compressibility. Venous Reflux:  None. Other Findings: No evidence of superficial thrombophlebitis or abnormal fluid collection. IMPRESSION: No evidence of deep venous thrombosis in either lower extremity. Electronically Signed   By: Aletta Edouard M.D.   On: 01/20/2022 11:06    Procedures Procedures    Medications Ordered in ED Medications  traMADol (ULTRAM) tablet 50 mg (50 mg Oral Given 01/20/22 1033)    ED Course/ Medical Decision Making/ A&P                           Medical Decision Making Pt presenting for pcps office for [place] to rule out dvt which is negative.  Mild warmth at the edematous  area left calf,  suspected early cellulitis with negative [place].  Pt has been prescribed abx by pcp, also advised elevation, gentle warm compresses.  Return precautions outlined.  Amount and/or Complexity of Data Reviewed Radiology: ordered.    Details: US doppler, no evidence of dvt bilateral.            Final Clinical Impression(s) / ED Diagnoses Final diagnoses:  Cellulitis of left lower extremity    Rx / DC Orders ED Discharge Orders     None         Landis Martins 01/21/22 2301    Godfrey Pick, MD 01/23/22 1116

## 2022-01-20 NOTE — Discharge Instructions (Signed)
Your ultrasound is negative for blood clot in your leg.  Please complete the antibiotic Dr. Moshe Cipro prescribed you and plan to follow-up with the vascular specialist she is referring you to as well.  Elevation of your leg along with gentle warm compresses or a gentle heating pad several times daily can also help with this presumptive skin infection.

## 2022-01-20 NOTE — Patient Instructions (Addendum)
Annual exam as before, call if you needme spooner, please bring all medication to the visit  [place] of left leg today  You are referred to Vascular Specialist  in [place]  CBC and diff and chem 7 and EGFR today  10 day antibiotic course is prescribed for presumed celllitiis start today  Tylenol ES is prescribed for pain take every 8 hours  Keep leg elevated

## 2022-01-20 NOTE — ED Triage Notes (Signed)
Pt c/o pain and swelling to left lower leg x 5 days  Pt was seen at her PCP this am and told to come to ED to have an US done to make sure she does not have a blood clot

## 2022-01-21 ENCOUNTER — Telehealth: Payer: Self-pay | Admitting: Family Medicine

## 2022-01-21 LAB — CBC WITH DIFFERENTIAL/PLATELET
Basophils Absolute: 0 10*3/uL (ref 0.0–0.2)
Basos: 0 %
EOS (ABSOLUTE): 0 10*3/uL (ref 0.0–0.4)
Eos: 0 %
Hematocrit: 43.6 % (ref 34.0–46.6)
Hemoglobin: 13.6 g/dL (ref 11.1–15.9)
Immature Grans (Abs): 0 10*3/uL (ref 0.0–0.1)
Immature Granulocytes: 0 %
Lymphocytes Absolute: 1.2 10*3/uL (ref 0.7–3.1)
Lymphs: 17 %
MCH: 26.8 pg (ref 26.6–33.0)
MCHC: 31.2 g/dL — ABNORMAL LOW (ref 31.5–35.7)
MCV: 86 fL (ref 79–97)
Monocytes Absolute: 0.4 10*3/uL (ref 0.1–0.9)
Monocytes: 6 %
Neutrophils Absolute: 5.1 10*3/uL (ref 1.4–7.0)
Neutrophils: 77 %
Platelets: 325 10*3/uL (ref 150–450)
RBC: 5.07 x10E6/uL (ref 3.77–5.28)
RDW: 13.2 % (ref 11.7–15.4)
WBC: 6.8 10*3/uL (ref 3.4–10.8)

## 2022-01-21 LAB — BMP8+EGFR
BUN/Creatinine Ratio: 17 (ref 12–28)
BUN: 20 mg/dL (ref 8–27)
CO2: 22 mmol/L (ref 20–29)
Calcium: 10.1 mg/dL (ref 8.7–10.3)
Chloride: 96 mmol/L (ref 96–106)
Creatinine, Ser: 1.17 mg/dL — ABNORMAL HIGH (ref 0.57–1.00)
Glucose: 102 mg/dL — ABNORMAL HIGH (ref 70–99)
Potassium: 4.3 mmol/L (ref 3.5–5.2)
Sodium: 139 mmol/L (ref 134–144)
eGFR: 50 mL/min/{1.73_m2} — ABNORMAL LOW (ref >59)

## 2022-01-21 NOTE — Telephone Encounter (Signed)
Michele Lowe called from Cover my meds piror authorization   Wevogy 0.25 mg received an error response to approval.  Called to work through the errors. [phone] reference # [id number]

## 2022-01-21 NOTE — Telephone Encounter (Signed)
Sent [name] in [facility] message about billing for Patient that went to ER by accident and was worked up.  She should have gone to Radiology for procedure urgent that was already in [place].

## 2022-01-22 ENCOUNTER — Encounter: Payer: Self-pay | Admitting: Family Medicine

## 2022-01-22 ENCOUNTER — Ambulatory Visit (INDEPENDENT_AMBULATORY_CARE_PROVIDER_SITE_OTHER): Payer: Medicare HMO | Admitting: Family Medicine

## 2022-01-22 VITALS — BP 126/75 | HR 82 | Ht 67.0 in | Wt 287.0 lb

## 2022-01-22 DIAGNOSIS — Z1231 Encounter for screening mammogram for malignant neoplasm of breast: Secondary | ICD-10-CM

## 2022-01-22 DIAGNOSIS — L03116 Cellulitis of left lower limb: Secondary | ICD-10-CM

## 2022-01-22 DIAGNOSIS — I872 Venous insufficiency (chronic) (peripheral): Secondary | ICD-10-CM

## 2022-01-22 DIAGNOSIS — Z0001 Encounter for general adult medical examination with abnormal findings: Secondary | ICD-10-CM | POA: Diagnosis not present

## 2022-01-22 MED ORDER — UNABLE TO FIND: 0 refills | Status: AC

## 2022-01-22 MED ORDER — CEFTRIAXONE SODIUM 500 MG IJ SOLR
500.0000 mg | Freq: Once | INTRAMUSCULAR | Status: AC
  Administered 2022-01-22: 500 mg via INTRAMUSCULAR

## 2022-01-22 NOTE — Patient Instructions (Addendum)
F/U in 3 months, call if you need me sooner  Keep up the GREAT work of weight loss  Rocephin 500 mg IM in office today for left leg cellulitis. Take antibiotic as prescribed, an use tramadol to to three times daily as needed for pain  Please see Rosaria Ferries to get appt info for Vascular Specialist while checking out  Please schedule mammogram at checkout  Please submit cologuard   Nurse pls give script for TdAP this is due  Please get current covid vaccine  Thanks for choosing [facility], we consider it a privelige to serve you.

## 2022-01-22 NOTE — Progress Notes (Unsigned)
    Michele Lowe     MRN: [id number]      DOB: June 01, 1950  HPI: Patient is in for annual physical exam. Still has pain and swelling of left leg, not much improvement, got appt at [facility] to evaluate left knewe  Recent labs,  are reviewed.needs to be updated with TdAP and Covid updated if needed.   PE: BP 126/75 (BP Location: Right Arm, Patient Position: Sitting, Cuff Size: Large)   Pulse 82   Ht '5\' 7"'$  (1.702 m)   Wt 287 lb 0.6 oz (130.2 kg)   SpO2 96%   BMI 44.96 kg/m   Pleasant  female, alert and oriented x 3, in no cardio-pulmonary distress. Afebrile. HEENT No facial trauma or asymetry. Sinuses non tender.  Extra occullar muscles intact.. External ears normal, . Neck: supple, no adenopathy,JVD or thyromegaly.No bruits.  Chest: Clear to ascultation bilaterally.No crackles or wheezes. Non tender to palpation  Cardiovascular system; Heart sounds normal,  S1 and  S2 ,no S3.  No murmur, or thrill. Apical beat not displaced Peripheral pulses normal.  Abdomen: Soft, non tender,.   .   Musculoskeletal exam: Decreased  ROM of spine, hips , shoulders and knees.  deformity ,swelling or crepitus noted. No muscle wasting or atrophy.   Neurologic: Cranial nerves 2 to 12 intact. Power, tone ,sensation anormal throughout.  disturbance in gait. No tremor.  Skin:hyperpigmentation of both lower extremities, erythema and tenderness of LLE . Psych; Normal mood and affect. Judgement and concentration normal   Assessment & Plan:  Annual visit for general adult medical examination with abnormal findings Annual exam as documented. . Immunization and cancer screening needs are specifically addressed at this visit.   Cellulitis Rocephin 500 mg IM  administered in office

## 2022-01-24 ENCOUNTER — Encounter: Payer: Self-pay | Admitting: Family Medicine

## 2022-01-24 DIAGNOSIS — L0390 Cellulitis, unspecified: Secondary | ICD-10-CM | POA: Insufficient documentation

## 2022-01-24 DIAGNOSIS — Z0001 Encounter for general adult medical examination with abnormal findings: Secondary | ICD-10-CM

## 2022-01-24 DIAGNOSIS — L03116 Cellulitis of left lower limb: Secondary | ICD-10-CM | POA: Insufficient documentation

## 2022-01-24 HISTORY — DX: Encounter for general adult medical examination with abnormal findings: Z00.01

## 2022-01-24 NOTE — Assessment & Plan Note (Signed)
Controlled, no change in medication DASH diet and commitment to daily physical activity for a minimum of 30 minutes discussed and encouraged, as a part of hypertension management. The importance of attaining a healthy weight is also discussed.     01/22/2022   10:29 AM 01/20/2022   12:09 PM 01/20/2022   10:22 AM 01/20/2022   10:09 AM 01/20/2022   10:08 AM 01/20/2022    8:17 AM 01/20/2022    8:15 AM  BP/Weight  Systolic BP 395 320 233  435 686 168  Diastolic BP 75 78 72  95 82 80  Wt. (Lbs) 287.04   285.94   286.12  BMI 44.96 kg/m2   44.78 kg/m2   44.81 kg/m2

## 2022-01-24 NOTE — Progress Notes (Signed)
   Michele Lowe     MRN: [id number]      DOB: [birth year]   HPI Ms. Snoke is here with a 1 week h/o left leg pain, swelling and redness, no known trauma, no fever, chills or shortness, has chronic vemnous stasis of both legs, no recent flare Significat weight loss with change in diet, trying to get weight off so she can have knee surgery   ROS Denies recent fever or chills. Denies sinus pressure, nasal congestion, ear pain or sore throat. Denies chest congestion, productive cough or wheezing. Denies chest pains, palpitations and leg swelling Denies abdominal pain, nausea, vomiting,diarrhea or constipation.   Denies dysuria, frequency, hesitancy or incontinence. Denies headaches, seizures, numbness, or tingling. Denies depression, anxiety or insomnia. h.   PE  BP 120/72   Pulse 89   Ht '5\' 7"'$  (1.702 m)   Wt 286 lb 1.9 oz (129.8 kg)   SpO2 96%   BMI 44.81 kg/m   Patient alert and oriented and in no cardiopulmonary distress.  HEENT: No facial asymmetry, EOMI,     Neck supple .  Chest: Clear to auscultation bilaterally.  CVS: S1, S2 no murmurs, no S3.Regular rate.  ABD: Soft non tender.   Ext: left leg edema and erythema, no obvious skin breakdown  MS: Decreased ROM spine, shoulders, hips and knees.  Skin: Intact, no ulcerations or rash noted.  Psych: Good eye contact, normal affect. Memory intact not anxious or depressed appearing.  CNS: CN 2-12 intact, power,  normal throughout.no focal deficits noted.   Assessment & Plan  Pain and swelling of left lower leg 1 week history, venous doppler stat, cBc and diff , presumed cellulitis  Essential hypertension Controlled, no change in medication DASH diet and commitment to daily physical activity for a minimum of 30 minutes discussed and encouraged, as a part of hypertension management. The importance of attaining a healthy weight is also discussed.     01/22/2022   10:29 AM 01/20/2022   12:09 PM 01/20/2022    10:22 AM 01/20/2022   10:09 AM 01/20/2022   10:08 AM 01/20/2022    8:17 AM 01/20/2022    8:15 AM  BP/Weight  Systolic BP 465 035 465  681 275 170  Diastolic BP 75 78 72  95 82 80  Wt. (Lbs) 287.04   285.94   286.12  BMI 44.96 kg/m2   44.78 kg/m2   44.81 kg/m2       MORBID OBESITY Improved  Patient re-educated about  the importance of commitment to a  minimum of 150 minutes of exercise per week as able.  The importance of healthy food choices with portion control discussed, as well as eating regularly and within a 12 hour window most days. The need to choose "clean , green" food 50 to 75% of the time is discussed, as well as to make water the primary drink and set a goal of 64 ounces water daily.       01/22/2022   10:29 AM 01/20/2022   10:09 AM 01/20/2022    8:15 AM  Weight /BMI  Weight 287 lb 0.6 oz 285 lb 15 oz 286 lb 1.9 oz  Height '5\' 7"'$  (1.702 m) '5\' 7"'$  (1.702 m) '5\' 7"'$  (1.702 m)  BMI 44.96 kg/m2 44.78 kg/m2 44.81 kg/m2

## 2022-01-24 NOTE — Assessment & Plan Note (Signed)
Annual exam as documented. . Immunization and cancer screening needs are specifically addressed at this visit.  

## 2022-01-24 NOTE — Assessment & Plan Note (Signed)
Rocephin 500 mg IM  administered in office

## 2022-01-24 NOTE — Assessment & Plan Note (Signed)
Improved  Patient re-educated about  the importance of commitment to a  minimum of 150 minutes of exercise per week as able.  The importance of healthy food choices with portion control discussed, as well as eating regularly and within a 12 hour window most days. The need to choose "clean , green" food 50 to 75% of the time is discussed, as well as to make water the primary drink and set a goal of 64 ounces water daily.       01/22/2022   10:29 AM 01/20/2022   10:09 AM 01/20/2022    8:15 AM  Weight /BMI  Weight 287 lb 0.6 oz 285 lb 15 oz 286 lb 1.9 oz  Height '5\' 7"'$  (1.702 m) '5\' 7"'$  (1.702 m) '5\' 7"'$  (1.702 m)  BMI 44.96 kg/m2 44.78 kg/m2 44.81 kg/m2

## 2022-01-25 ENCOUNTER — Other Ambulatory Visit: Payer: Self-pay | Admitting: *Deleted

## 2022-01-25 DIAGNOSIS — M79662 Pain in left lower leg: Secondary | ICD-10-CM

## 2022-01-25 NOTE — Telephone Encounter (Signed)
Unable to reach.

## 2022-01-26 ENCOUNTER — Ambulatory Visit (HOSPITAL_COMMUNITY)
Admission: RE | Admit: 2022-01-26 | Discharge: 2022-01-26 | Disposition: A | Discharge status: Home / Self Care | Payer: Medicare HMO | Source: Ambulatory Visit | Attending: Vascular Surgery | Admitting: Vascular Surgery

## 2022-01-26 ENCOUNTER — Encounter: Payer: Self-pay | Admitting: Vascular Surgery

## 2022-01-26 ENCOUNTER — Ambulatory Visit (INDEPENDENT_AMBULATORY_CARE_PROVIDER_SITE_OTHER): Payer: Medicare HMO | Admitting: Vascular Surgery

## 2022-01-26 VITALS — BP 130/82 | HR 80 | Temp 97.6°F | Resp 16 | Ht 67.0 in | Wt 282.0 lb

## 2022-01-26 DIAGNOSIS — M79662 Pain in left lower leg: Secondary | ICD-10-CM | POA: Insufficient documentation

## 2022-01-26 DIAGNOSIS — I872 Venous insufficiency (chronic) (peripheral): Secondary | ICD-10-CM

## 2022-01-26 DIAGNOSIS — M7989 Other specified soft tissue disorders: Secondary | ICD-10-CM | POA: Diagnosis not present

## 2022-01-26 NOTE — Progress Notes (Signed)
Patient name: Michele Lowe MRN: [id number] DOB: January 14, 1951 Sex: female  REASON FOR CONSULT: Venous stasis, left leg pain and swelling  HPI: Michele Lowe is a 71 y.o. female, with history of hypertension, hyperlipidemia, morbid obesity with a BMI of 44, melanoma of the arm that presents for evaluation of venous stasis with left leg pain and swelling.  She states this has gotten much worse over the last several weeks.  She has had more prominent swelling in the left leg.  She has pain around the ankle.  Her PCP started her on antibiotics.  She is wearing compression stockings from 2007.  States has a hard time getting these on but they are thigh high.  She can remember several instances in the past where she has had ulcers on her leg that have healed on their own and she has had wraps consistent with Unna boots in the past.  Past Medical History:  Diagnosis Date   Allergy    Annual physical exam 08/04/2015   Annual visit for general adult medical examination with abnormal findings 01/24/2022   Arthritis    Cancer ([place]) 06/20/2014   MELANOMA, LEFT  LOWER ARM   Educated about COVID-19 virus infection 07/01/2018   Essential hypertension, benign 2009   Hypertension    Phreesia 09/09/2019   KNEE PAIN, RIGHT 09/13/2008   Qualifier: Diagnosis of  By: Cori Razor LPN, Brandi   Qualifier: Diagnosis of  By: Moshe Cipro MD, Margaret     Metabolic syndrome X 40/98/1191   Morbid obesity ([place])    Need for influenza vaccination 11/27/2018   Rhinosinusitis     Past Surgical History:  Procedure Laterality Date   ABDOMINAL HYSTERECTOMY     Partial    JOINT REPLACEMENT  05/13/2011   Revision of left knee replacement in 2008   MOLE REMOVAL  06/20/2014   Dr Harriet Masson   Partial hysterectomy ,secondary to fibroid . Pt still have both ovaries  02/16/1976   Rt. forearm surgery for nerve repair  02/16/1987   TOTAL KNEE ARTHROPLASTY  02/15/2005   Right    TUBAL LIGATION  5/?/1980    Family History   Problem Relation Age of Onset   Heart failure Mother    Hypertension Mother    Heart disease Mother        Irregular heart valve   Hypertension Father    Emphysema Father    Heart disease Father    COPD Father    Arthritis Father    Diabetes Brother    Heart failure Brother    Diabetes Brother    Diabetes Brother    Hyperlipidemia Brother    Hypertension Sister    Hypertension Sister     SOCIAL HISTORY: Social History   Socioeconomic History   Marital status: Divorced    Spouse name: Not on file   Number of children: 3   Years of education: Not on file   Highest education level: Not on file  Occupational History   Occupation: Disabled   Tobacco Use   Smoking status: Never   Smokeless tobacco: Never  Substance and Sexual Activity   Alcohol use: No   Drug use: No   Sexual activity: Not Currently    Birth control/protection: None  Other Topics Concern   Not on file  Social History Narrative   Not on file   Social Determinants of Health   Financial Resource Strain: Low Risk  (09/14/2021)   Overall Financial Resource Strain (CARDIA)  Difficulty of Paying Living Expenses: Not very hard  Food Insecurity: No Food Insecurity (09/14/2021)   Hunger Vital Sign    Worried About Running Out of Food in the Last Year: Never true    Ran Out of Food in the Last Year: Never true  Transportation Needs: No Transportation Needs (09/14/2021)   PRAPARE - Hydrologist (Medical): No    Lack of Transportation (Non-Medical): No  Physical Activity: Sufficiently Active (09/14/2021)   Exercise Vital Sign    Days of Exercise per Week: 3 days    Minutes of Exercise per Session: 60 min  Stress: No Stress Concern Present (09/14/2021)   Reading    Feeling of Stress : Not at all  Social Connections: Moderately Integrated (09/14/2021)   Social Connection and Isolation Panel [NHANES]    Frequency  of Communication with Friends and Family: More than three times a week    Frequency of Social Gatherings with Friends and Family: Three times a week    Attends Religious Services: More than 4 times per year    Active Member of Clubs or Organizations: Yes    Attends Archivist Meetings: More than 4 times per year    Marital Status: Divorced  Intimate Partner Violence: Not At Risk (09/14/2021)   Humiliation, Afraid, Rape, and Kick questionnaire    Fear of Current or Ex-Partner: No    Emotionally Abused: No    Physically Abused: No    Sexually Abused: No    Allergies  Allergen Reactions   Codeine Hives   Influenza Vaccines Other (See Comments)    Unilateral achyness, incoherent   Oxycodone Hcl Hives    Current Outpatient Medications  Medication Sig Dispense Refill   acetaminophen (TYLENOL 8 HOUR) 650 MG CR tablet Take 1 tablet (650 mg total) by mouth every 8 (eight) hours. 21 tablet 0   Black Currant Seed Oil 500 MG CAPS Take by mouth.     calcium-vitamin D (OSCAL WITH D) 500-200 MG-UNIT tablet Take 1 tablet by mouth 2 (two) times daily. 180 tablet 3   cephALEXin (KEFLEX) 500 MG capsule Take 1 capsule (500 mg total) by mouth 3 (three) times daily. 30 capsule 0   furosemide (LASIX) 40 MG tablet TAKE 1 TABLET EVERY DAY 90 tablet 10   loratadine (CLARITIN) 10 MG tablet Take 1 tablet (10 mg total) by mouth daily. 90 tablet 1   Multiple Vitamin (MULTIVITAMIN PO) Take by mouth daily.     Omega-3 Fatty Acids (FISH OIL) 1000 MG CAPS Take 1 capsule by mouth daily.     OVER THE COUNTER MEDICATION Tumeric 2250 mg  1 - 2 per day     OVER THE COUNTER MEDICATION glucosamine 1500 mg Chondroitin 1200 mg  one daily     OVER THE COUNTER MEDICATION Aspercreme with 4% lidocaine roll on liquid. Apply to right knee for pain as needed.     potassium chloride SA (KLOR-CON M) 20 MEQ tablet TAKE 1 TABLET EVERY DAY 90 tablet 1   rosuvastatin (CRESTOR) 5 MG tablet Take one tablet by mouth every  Monday, Wednesday and Friday 36 tablet 0   Semaglutide-Weight Management 0.25 MG/0.5ML SOAJ Inject 0.25 mg into the skin once a week. 2 mL 0   traMADol (ULTRAM) 50 MG tablet Take 1 tablet (50 mg total) by mouth every 6 (six) hours as needed for severe pain. 15 tablet 0   triamterene-hydrochlorothiazide (MAXZIDE-25) 37.5-25  MG tablet TAKE 1 AND 1/2 TABLETS ONE TIME DAILY 135 tablet 1   UNABLE TO FIND Med Name: TDAP 1 each 0   No current facility-administered medications for this visit.    REVIEW OF SYSTEMS:  '[X]'$  denotes positive finding, '[ ]'$  denotes negative finding Cardiac  Comments:  Chest pain or chest pressure:    Shortness of breath upon exertion:    Short of breath when lying flat:    Irregular heart rhythm:        Vascular    Pain in calf, thigh, or hip brought on by ambulation:    Pain in feet at night that wakes you up from your sleep:     Blood clot in your veins:    Leg swelling:  x Left       Pulmonary    Oxygen at home:    Productive cough:     Wheezing:         Neurologic    Sudden weakness in arms or legs:     Sudden numbness in arms or legs:     Sudden onset of difficulty speaking or slurred speech:    Temporary loss of vision in one eye:     Problems with dizziness:         Gastrointestinal    Blood in stool:     Vomited blood:         Genitourinary    Burning when urinating:     Blood in urine:        Psychiatric    Major depression:         Hematologic    Bleeding problems:    Problems with blood clotting too easily:        Skin    Rashes or ulcers:        Constitutional    Fever or chills:      PHYSICAL EXAM: Vitals:   01/26/22 1319  BP: 130/82  Pulse: 80  Resp: 16  Temp: 97.6 F (36.4 C)  TempSrc: Temporal  SpO2: 94%  Weight: 282 lb (127.9 kg)  Height: '5\' 7"'$  (1.702 m)    GENERAL: The patient is a well-nourished female, in no acute distress. The vital signs are documented above. CARDIAC: There is a regular rate and rhythm.   VASCULAR:  Bilateral DP pulses palpable Bilateral lower extremities with skin and texture changes with hyperpigmentation and lipodermatosclerosis consistent with venous stasis No open leg ulcers at this time. PULMONARY: No respiratory distress. ABDOMEN: Soft and non-tender. MUSCULOSKELETAL: There are no major deformities or cyanosis. NEUROLOGIC: No focal weakness or paresthesias are detected. PSYCHIATRIC: The patient has a normal affect.    DATA:   Lower Venous Reflux Study   Patient Name:  STARLINA LAPRE  Date of Exam:   01/26/2022  Medical Rec #: [id number]         Accession #:    [id number]  Date of Birth: 1950/08/19        Patient Gender: F  Patient Age:   77 years  Exam Location:  [facility]  Procedure:      VAS [place] LOWER EXTREMITY VENOUS REFLUX  Referring Phys: Monica Martinez    ---------------------------------------------------------------------------  -----    Indications: Edema, and Pain. Left leg. Negative DVT exam    Limitations: Body habitus. Vessel depth.  Performing Technologist: Ronal Fear RVS, RCS     Examination Guidelines: A complete evaluation includes B-mode imaging,  spectral  Doppler, color Doppler, and  power Doppler as needed of all accessible  portions  of each vessel. Bilateral testing is considered an integral part of a  complete  examination. Limited examinations for reoccurring indications may be  performed  as noted. The reflux portion of the exam is performed with the patient in  reverse Trendelenburg.  Significant venous reflux is defined as >500 ms in the superficial venous  system, and >1 second in the deep venous system.     +------------------+---------+------+-----------+------------+-------------  ----+  LEFT             Reflux NoRefluxReflux TimeDiameter cmsComments                                        Yes                                              +------------------+---------+------+-----------+------------+-------------  ----+  CFV                        yes   >1 second                                 +------------------+---------+------+-----------+------------+-------------  ----+  FV mid            no                                    evaluated  with                                                             color  Doppler      +------------------+---------+------+-----------+------------+-------------  ----+  Popliteal        no                                    evaluated  with                                                             color  Doppler      +------------------+---------+------+-----------+------------+-------------  ----+  GSV at [facility]        no                            0.62                        +------------------+---------+------+-----------+------------+-------------  ----+  GSV prox thigh              yes    >500 ms      0.48                        +------------------+---------+------+-----------+------------+-------------  ----+  GSV mid thigh               yes    >500 ms      0.41    branch              +------------------+---------+------+-----------+------------+-------------  ----+  GSV dist thigh    no                            0.28                        +------------------+---------+------+-----------+------------+-------------  ----+  GSV at knee       no                            0.23                        +------------------+---------+------+-----------+------------+-------------  ----+  SSV Pop Fossa     no                            0.31                        +------------------+---------+------+-----------+------------+-------------  ----+  anterior accessoryno                            0.44                         +------------------+---------+------+-----------+------------+-------------  ----+      Summary:  Left:  - No evidence of deep vein thrombosis from the common femoral through the  popliteal veins.  - No evidence of superficial venous thrombosis.  - The common femoral vein is not competent.  - The great saphenous vein is not competent.  - The small saphenous vein is competent.      Assessment/Plan:  71 year old female presents with evidence of chronic venous insufficiency worse in the left lower extremity consistent with CEAP classification C4b given evidence of lipodermatosclerosis.  Discussed reflux study show she has both superficial and deep venous reflux in the left leg.  No evidence of DVT.  No reflux at the [facility] and really not a candidate for laser ablation at this time.  I have recommended weight loss with leg elevation as well as medical grade compression stockings.  We did get her sized for medical grade knee-high stockings today since she has been wearing stockings from 2007.  Discussed she let me know if she develops an ulcer or has any worsening symptoms and would need to transition to [employer].  She can follow-up PRN.   Marty Heck, MD [facility] Office: [phone]

## 2022-01-27 DIAGNOSIS — L539 Erythematous condition, unspecified: Secondary | ICD-10-CM | POA: Diagnosis not present

## 2022-01-27 DIAGNOSIS — Z96652 Presence of left artificial knee joint: Secondary | ICD-10-CM | POA: Diagnosis not present

## 2022-01-27 DIAGNOSIS — L03116 Cellulitis of left lower limb: Secondary | ICD-10-CM | POA: Diagnosis not present

## 2022-01-27 DIAGNOSIS — I10 Essential (primary) hypertension: Secondary | ICD-10-CM | POA: Diagnosis not present

## 2022-01-27 DIAGNOSIS — E785 Hyperlipidemia, unspecified: Secondary | ICD-10-CM | POA: Diagnosis not present

## 2022-01-27 DIAGNOSIS — I878 Other specified disorders of veins: Secondary | ICD-10-CM | POA: Diagnosis not present

## 2022-01-27 DIAGNOSIS — Z471 Aftercare following joint replacement surgery: Secondary | ICD-10-CM | POA: Diagnosis not present

## 2022-01-27 DIAGNOSIS — E876 Hypokalemia: Secondary | ICD-10-CM | POA: Diagnosis not present

## 2022-01-27 DIAGNOSIS — N179 Acute kidney failure, unspecified: Secondary | ICD-10-CM | POA: Diagnosis not present

## 2022-01-27 DIAGNOSIS — K219 Gastro-esophageal reflux disease without esophagitis: Secondary | ICD-10-CM | POA: Diagnosis not present

## 2022-01-27 DIAGNOSIS — M1712 Unilateral primary osteoarthritis, left knee: Secondary | ICD-10-CM | POA: Diagnosis not present

## 2022-01-27 DIAGNOSIS — M7989 Other specified soft tissue disorders: Secondary | ICD-10-CM | POA: Diagnosis not present

## 2022-01-27 DIAGNOSIS — L03119 Cellulitis of unspecified part of limb: Secondary | ICD-10-CM | POA: Insufficient documentation

## 2022-01-27 DIAGNOSIS — R600 Localized edema: Secondary | ICD-10-CM | POA: Diagnosis not present

## 2022-01-27 DIAGNOSIS — M170 Bilateral primary osteoarthritis of knee: Secondary | ICD-10-CM | POA: Diagnosis not present

## 2022-01-27 DIAGNOSIS — I872 Venous insufficiency (chronic) (peripheral): Secondary | ICD-10-CM | POA: Diagnosis not present

## 2022-01-27 DIAGNOSIS — E878 Other disorders of electrolyte and fluid balance, not elsewhere classified: Secondary | ICD-10-CM | POA: Diagnosis not present

## 2022-01-27 DIAGNOSIS — M1711 Unilateral primary osteoarthritis, right knee: Secondary | ICD-10-CM | POA: Diagnosis not present

## 2022-01-28 DIAGNOSIS — L03116 Cellulitis of left lower limb: Secondary | ICD-10-CM | POA: Diagnosis not present

## 2022-01-28 DIAGNOSIS — Z96652 Presence of left artificial knee joint: Secondary | ICD-10-CM | POA: Diagnosis not present

## 2022-01-28 DIAGNOSIS — M7989 Other specified soft tissue disorders: Secondary | ICD-10-CM | POA: Diagnosis not present

## 2022-01-28 DIAGNOSIS — L539 Erythematous condition, unspecified: Secondary | ICD-10-CM | POA: Diagnosis not present

## 2022-01-29 DIAGNOSIS — M7989 Other specified soft tissue disorders: Secondary | ICD-10-CM | POA: Diagnosis not present

## 2022-01-29 DIAGNOSIS — L03116 Cellulitis of left lower limb: Secondary | ICD-10-CM | POA: Diagnosis not present

## 2022-02-04 ENCOUNTER — Ambulatory Visit (INDEPENDENT_AMBULATORY_CARE_PROVIDER_SITE_OTHER): Payer: Medicare HMO | Admitting: Family Medicine

## 2022-02-04 ENCOUNTER — Telehealth: Payer: Self-pay

## 2022-02-04 ENCOUNTER — Encounter: Payer: Self-pay | Admitting: Family Medicine

## 2022-02-04 VITALS — BP 122/82 | HR 73 | Ht 67.0 in | Wt 288.0 lb

## 2022-02-04 DIAGNOSIS — I872 Venous insufficiency (chronic) (peripheral): Secondary | ICD-10-CM | POA: Diagnosis not present

## 2022-02-04 DIAGNOSIS — L03116 Cellulitis of left lower limb: Secondary | ICD-10-CM

## 2022-02-04 DIAGNOSIS — I10 Essential (primary) hypertension: Secondary | ICD-10-CM

## 2022-02-04 DIAGNOSIS — Z09 Encounter for follow-up examination after completed treatment for conditions other than malignant neoplasm: Secondary | ICD-10-CM | POA: Diagnosis not present

## 2022-02-04 MED ORDER — DOXYCYCLINE HYCLATE 100 MG PO TABS: 100.0000 mg | ORAL_TABLET | Freq: Two times a day (BID) | ORAL | 0 refills | Status: DC

## 2022-02-04 MED ORDER — CEFTRIAXONE SODIUM 500 MG IJ SOLR
500.0000 mg | Freq: Once | INTRAMUSCULAR | Status: AC
  Administered 2022-02-04: 500 mg via INTRAMUSCULAR

## 2022-02-04 NOTE — Telephone Encounter (Signed)
     Patient  visit on 01/20/2022  at [facility] was for Cellulitis of left lower extremity, leg pain.  Have you been able to follow up with your primary care physician? Yes  The patient was or was not able to obtain any needed medicine or equipment. Patient obtained medications.  Are there diet recommendations that you are having difficulty following? No  Patient expresses understanding of discharge instructions and education provided has no other needs at this time.    [place] Resource Care Guide   [email or web address]  ?? [phone]   Website: [email or web address]  [email or web address]

## 2022-02-04 NOTE — Patient Instructions (Signed)
F/u in 5 weeks, call if you need me sooner  Rocephin 500 mh IM in office today for persistent cellulitis left leg  One week course of doxycycline prescribed  Please stay off of leg and elevate as much as possible  Cool compression/ icing every 3 to 4 hours is appropriate  Thanks for choosing [facility], we consider it a privelige to serve you.

## 2022-02-08 ENCOUNTER — Encounter: Payer: Self-pay | Admitting: Family Medicine

## 2022-02-09 ENCOUNTER — Encounter: Payer: Self-pay | Admitting: Family Medicine

## 2022-02-09 DIAGNOSIS — Z09 Encounter for follow-up examination after completed treatment for conditions other than malignant neoplasm: Secondary | ICD-10-CM | POA: Insufficient documentation

## 2022-02-09 NOTE — Assessment & Plan Note (Signed)
Patient in for follow up of recent hospitalization. Discharge summary, and laboratory and radiology data are reviewed, and any questions or concerns  are discussed. Specific issues requiring follow up are specifically addressed.  

## 2022-02-09 NOTE — Assessment & Plan Note (Signed)
controlled , no med change DASH diet and commitment to daily physical activity for a minimum of 30 minutes discussed and encouraged, as a part of hypertension management. The importance of attaining a healthy weight is also discussed.     02/04/2022    8:33 AM 01/26/2022    1:19 PM 01/22/2022   10:29 AM 01/20/2022   12:09 PM 01/20/2022   10:22 AM 01/20/2022   10:09 AM 01/20/2022   10:08 AM  BP/Weight  Systolic BP 712 458 099 833 825  053  Diastolic BP 82 82 75 78 72  95  Wt. (Lbs) 288.04 282 287.04   285.94   BMI 45.11 kg/m2 44.17 kg/m2 44.96 kg/m2   44.78 kg/m2     '

## 2022-02-09 NOTE — Assessment & Plan Note (Signed)
Recently evaluated no follow up scheduled locally, to return on as needed basis, being fitted for appropriate size compression hose with advice to elevated legs  ad wear hose

## 2022-02-09 NOTE — Assessment & Plan Note (Signed)
Persistent LLE cellulitis, Rocephin 500 mg IM in office followed by doxycycline, re eval in 5 weeks

## 2022-02-09 NOTE — Progress Notes (Signed)
   Michele Lowe     MRN: [id number]      DOB: [birth year]   HPI Michele Lowe is here for follow up aof recent hospitalization at [facility] where she was admotted for cellulitis. Reports that there has been some improvement, however concerned that in th past 2 days increased redness and swelling. Denies fever , chills or drainage, has been up and about to a limited though increased extent as compared to her hospitalization. There are no specific complaints   ROS Denies recent fever or chills. Denies sinus pressure, nasal congestion, ear pain or sore throat. Denies chest congestion, productive cough or wheezing. Denies chest pains, palpitations and leg swelling Denies abdominal pain, nausea, vomiting,diarrhea or constipation.   Denies dysuria, frequency, hesitancy or incontinence. . Denies headaches, seizures, numbness, or tingling. Denies depression, anxiety or insomnia.  PE  BP 122/82 (BP Location: Right Arm, Patient Position: Sitting, Cuff Size: Large)   Pulse 73   Ht '5\' 7"'$  (1.702 m)   Wt 288 lb 0.6 oz (130.7 kg)   SpO2 95%   BMI 45.11 kg/m   Patient alert and oriented and in no cardiopulmonary distress.  HEENT: No facial asymmetry, EOMI,     Neck supple .  Chest: Clear to auscultation bilaterally.  CVS: S1, S2 no murmurs, no S3.Regular rate.  ABD: Soft non tender.   Ext: Trace edema LLE , hyperpigmentation, ild erythema and warmth, no skin breakdown or drainage noted  MS: Adequate ROM spine, shoulders, hips and knees.  Skin: Intact, no ulcerations or rash noted.  Psych: Good eye contact, normal affect. Memory intact not anxious or depressed appearing.  CNS: CN 2-12 intact, power,  normal throughout.no focal deficits noted.   Assessment & Plan  Cellulitis of left lower extremity Persistent LLE cellulitis, Rocephin 500 mg IM in office followed by doxycycline, re eval in 5 weeks  Hospital discharge follow-up Patient in for follow up of recent  hospitalization. Discharge summary, and laboratory and radiology data are reviewed, and any questions or concerns  are discussed. Specific issues requiring follow up are specifically addressed.   Essential hypertension controlled , no med change DASH diet and commitment to daily physical activity for a minimum of 30 minutes discussed and encouraged, as a part of hypertension management. The importance of attaining a healthy weight is also discussed.     02/04/2022    8:33 AM 01/26/2022    1:19 PM 01/22/2022   10:29 AM 01/20/2022   12:09 PM 01/20/2022   10:22 AM 01/20/2022   10:09 AM 01/20/2022   10:08 AM  BP/Weight  Systolic BP 387 564 332 951 884  166  Diastolic BP 82 82 75 78 72  95  Wt. (Lbs) 288.04 282 287.04   285.94   BMI 45.11 kg/m2 44.17 kg/m2 44.96 kg/m2   44.78 kg/m2     '  Chronic venous insufficiency Recently evaluated no follow up scheduled locally, to return on as needed basis, being fitted for appropriate size compression hose with advice to elevated legs  ad wear hose

## 2022-02-11 ENCOUNTER — Ambulatory Visit (HOSPITAL_COMMUNITY)
Admission: RE | Admit: 2022-02-11 | Discharge: 2022-02-11 | Disposition: A | Discharge status: Home / Self Care | Payer: Medicare HMO | Source: Ambulatory Visit | Attending: Family Medicine | Admitting: Family Medicine

## 2022-02-11 DIAGNOSIS — Z1231 Encounter for screening mammogram for malignant neoplasm of breast: Secondary | ICD-10-CM | POA: Insufficient documentation

## 2022-02-12 ENCOUNTER — Ambulatory Visit (HOSPITAL_COMMUNITY): Payer: Medicare HMO

## 2022-02-16 DIAGNOSIS — Z471 Aftercare following joint replacement surgery: Secondary | ICD-10-CM | POA: Diagnosis not present

## 2022-02-16 DIAGNOSIS — T8484XD Pain due to internal orthopedic prosthetic devices, implants and grafts, subsequent encounter: Secondary | ICD-10-CM | POA: Diagnosis not present

## 2022-02-16 DIAGNOSIS — Z96653 Presence of artificial knee joint, bilateral: Secondary | ICD-10-CM | POA: Diagnosis not present

## 2022-02-16 DIAGNOSIS — Z96652 Presence of left artificial knee joint: Secondary | ICD-10-CM | POA: Diagnosis not present

## 2022-02-16 DIAGNOSIS — M1711 Unilateral primary osteoarthritis, right knee: Secondary | ICD-10-CM | POA: Diagnosis not present

## 2022-02-18 ENCOUNTER — Encounter: Payer: Self-pay | Admitting: Family Medicine

## 2022-02-18 ENCOUNTER — Ambulatory Visit (INDEPENDENT_AMBULATORY_CARE_PROVIDER_SITE_OTHER): Payer: Medicare HMO | Admitting: Family Medicine

## 2022-02-18 VITALS — BP 128/82 | HR 80 | Ht 67.0 in | Wt 290.1 lb

## 2022-02-18 DIAGNOSIS — L03116 Cellulitis of left lower limb: Secondary | ICD-10-CM | POA: Diagnosis not present

## 2022-02-18 DIAGNOSIS — I10 Essential (primary) hypertension: Secondary | ICD-10-CM | POA: Diagnosis not present

## 2022-02-18 DIAGNOSIS — I872 Venous insufficiency (chronic) (peripheral): Secondary | ICD-10-CM | POA: Diagnosis not present

## 2022-02-18 DIAGNOSIS — L509 Urticaria, unspecified: Secondary | ICD-10-CM | POA: Diagnosis not present

## 2022-02-18 NOTE — Progress Notes (Signed)
   Michele Lowe     MRN: [id number]      DOB: 1950-02-18   HPI Michele Lowe is here for follow up and re-evaluation of chronic medical conditions, medication management and review of any available recent lab and radiology data.  C/o hives coming up on her face o 02/08/2022, is concerned that it may have been a reaction to the antibiotic she has been taking for left leg cellulitis. States that the week prior she had hives on the left leg also, never any symptoms of airway compromise, no difficulty breathing or swallowing No new foods reported Less pain and stiffness of right leg and kne, has MRI of right knee scheduled at [facility] Denies recent fever or chills. Denies sinus pressure, nasal congestion, ear pain or sore throat. Denies chest congestion, productive cough or wheezing. Denies chest pains, palpitations and leg swelling Denies abdominal pain, nausea, vomiting,diarrhea or constipation.   Denies dysuria, frequency, hesitancy or incontinence. Denies uncontrolled joint pain, swelling and limitation in mobility. Denies headaches, seizures, numbness, or tingling. Denies depression, anxiety or insomnia.  PE  BP 128/82   Pulse 80   Ht '5\' 7"'$  (1.702 m)   Wt 290 lb 1.9 oz (131.6 kg)   SpO2 95%   BMI 45.44 kg/m   Patient alert and oriented and in no cardiopulmonary distress.  HEENT: No facial asymmetry, EOMI,     Neck supple .  Chest: Clear to auscultation bilaterally.  CVS: S1, S2 no murmurs, no S3.Regular rate.  ABD: Soft non tender.   Ext: No edema  MS: Adequate ROM spine, shoulders, hips and knees.  Skin: Intact, no ulcerations or rash noted.Right leg not examined at visit, layers of clothing/ hose  Psych: Good eye contact, normal affect. Memory intact not anxious or depressed appearing.  CNS: CN 2-12 intact, power,  normal throughout.no focal deficits noted.   Assessment & Plan  Hives Reports 2 episodes of "hives" which have occurred since she started  taking the antibioitc doxycyline , on the left leg  and then on 12/25 on face, never difficulty breathing, or swallowing, refer allergist  Cellulitis of left lower extremity Resolved , no additional antibiotic prescribed  Chronic venous insufficiency Awaiting new compression hose, currently elevating and icing leg with success  Essential hypertension Controlled, no change in medication   MORBID OBESITY  Patient re-educated about  the importance of commitment to a  minimum of 150 minutes of exercise per week as able.  The importance of healthy food choices with portion control discussed, as well as eating regularly and within a 12 hour window most days. The need to choose "clean , green" food 50 to 75% of the time is discussed, as well as to make water the primary drink and set a goal of 64 ounces water daily.       02/18/2022    3:44 PM 02/04/2022    8:33 AM 01/26/2022    1:19 PM  Weight /BMI  Weight 290 lb 1.9 oz 288 lb 0.6 oz 282 lb  Height '5\' 7"'$  (1.702 m) '5\' 7"'$  (1.702 m) '5\' 7"'$  (1.702 m)  BMI 45.44 kg/m2 45.11 kg/m2 44.17 kg/m2

## 2022-02-18 NOTE — Assessment & Plan Note (Signed)
Controlled, no change in medication  

## 2022-02-18 NOTE — Assessment & Plan Note (Signed)
Reports 2 episodes of "hives" which have occurred since she started taking the antibioitc doxycyline , on the left leg  and then on 12/25 on face, never difficulty breathing, or swallowing, refer allergist

## 2022-02-18 NOTE — Assessment & Plan Note (Signed)
  Patient re-educated about  the importance of commitment to a  minimum of 150 minutes of exercise per week as able.  The importance of healthy food choices with portion control discussed, as well as eating regularly and within a 12 hour window most days. The need to choose "clean , green" food 50 to 75% of the time is discussed, as well as to make water the primary drink and set a goal of 64 ounces water daily.       02/18/2022    3:44 PM 02/04/2022    8:33 AM 01/26/2022    1:19 PM  Weight /BMI  Weight 290 lb 1.9 oz 288 lb 0.6 oz 282 lb  Height '5\' 7"'$  (1.702 m) '5\' 7"'$  (1.702 m) '5\' 7"'$  (1.702 m)  BMI 45.44 kg/m2 45.11 kg/m2 44.17 kg/m2

## 2022-02-18 NOTE — Assessment & Plan Note (Signed)
Awaiting new compression hose, currently elevating and icing leg with success

## 2022-02-18 NOTE — Patient Instructions (Addendum)
Follow-up in March, cancel January appointment please.  Blood pressure is excellent and cellulitis has resolved per your report.  You are referred to allergist for testing.  Please do get the current COVID-vaccine as well as the RSV vaccine you need both.  Tdap is due please also get this at your pharmacy  Thanks for choosing [facility] we consider it a privelige to serve you.   Chem 7 and eGFR 3 to 5 days before March appt

## 2022-02-18 NOTE — Assessment & Plan Note (Signed)
Resolved , no additional antibiotic prescribed

## 2022-02-22 ENCOUNTER — Other Ambulatory Visit: Payer: Self-pay | Admitting: Family Medicine

## 2022-02-23 ENCOUNTER — Other Ambulatory Visit: Payer: Self-pay

## 2022-02-23 DIAGNOSIS — Z1211 Encounter for screening for malignant neoplasm of colon: Secondary | ICD-10-CM | POA: Diagnosis not present

## 2022-02-23 MED ORDER — SEMAGLUTIDE-WEIGHT MANAGEMENT 0.25 MG/0.5ML ~~LOC~~ SOAJ: 0.2500 mg | SUBCUTANEOUS | 0 refills | Status: DC

## 2022-03-02 ENCOUNTER — Other Ambulatory Visit: Payer: Self-pay

## 2022-03-02 ENCOUNTER — Telehealth: Payer: Self-pay | Admitting: Family Medicine

## 2022-03-02 MED ORDER — SEMAGLUTIDE-WEIGHT MANAGEMENT 0.25 MG/0.5ML ~~LOC~~ SOAJ: 0.2500 mg | SUBCUTANEOUS | 0 refills | Status: DC

## 2022-03-02 NOTE — Telephone Encounter (Signed)
Janett Billow called from [place] said patient is requesting Semaglutide-Weight Management 0.25 MG/0.5ML SOAJ  90 day supply    [place]

## 2022-03-02 NOTE — Telephone Encounter (Signed)
Med sent to [facility]

## 2022-03-06 LAB — COLOGUARD: COLOGUARD: NEGATIVE

## 2022-03-11 ENCOUNTER — Ambulatory Visit: Payer: Medicare HMO | Admitting: Family Medicine

## 2022-03-17 ENCOUNTER — Ambulatory Visit: Payer: Medicare HMO | Admitting: Nutrition

## 2022-03-22 DIAGNOSIS — Z96653 Presence of artificial knee joint, bilateral: Secondary | ICD-10-CM | POA: Diagnosis not present

## 2022-03-22 DIAGNOSIS — Z471 Aftercare following joint replacement surgery: Secondary | ICD-10-CM | POA: Diagnosis not present

## 2022-03-22 DIAGNOSIS — T8484XD Pain due to internal orthopedic prosthetic devices, implants and grafts, subsequent encounter: Secondary | ICD-10-CM | POA: Diagnosis not present

## 2022-03-22 DIAGNOSIS — Z96652 Presence of left artificial knee joint: Secondary | ICD-10-CM | POA: Diagnosis not present

## 2022-03-29 ENCOUNTER — Ambulatory Visit: Payer: Medicare HMO | Admitting: Nutrition

## 2022-04-01 DIAGNOSIS — Z0100 Encounter for examination of eyes and vision without abnormal findings: Secondary | ICD-10-CM | POA: Diagnosis not present

## 2022-04-01 DIAGNOSIS — H524 Presbyopia: Secondary | ICD-10-CM | POA: Diagnosis not present

## 2022-04-15 ENCOUNTER — Encounter: Payer: Self-pay | Admitting: Radiology

## 2022-04-27 ENCOUNTER — Encounter: Payer: Self-pay | Admitting: Family Medicine

## 2022-04-27 ENCOUNTER — Ambulatory Visit (INDEPENDENT_AMBULATORY_CARE_PROVIDER_SITE_OTHER): Payer: Medicare HMO | Admitting: Family Medicine

## 2022-04-27 VITALS — BP 130/80 | HR 80 | Ht 67.0 in | Wt 298.0 lb

## 2022-04-27 DIAGNOSIS — M7989 Other specified soft tissue disorders: Secondary | ICD-10-CM | POA: Diagnosis not present

## 2022-04-27 DIAGNOSIS — I10 Essential (primary) hypertension: Secondary | ICD-10-CM

## 2022-04-27 DIAGNOSIS — E785 Hyperlipidemia, unspecified: Secondary | ICD-10-CM | POA: Diagnosis not present

## 2022-04-27 DIAGNOSIS — I872 Venous insufficiency (chronic) (peripheral): Secondary | ICD-10-CM

## 2022-04-27 DIAGNOSIS — M79662 Pain in left lower leg: Secondary | ICD-10-CM

## 2022-04-27 DIAGNOSIS — J3089 Other allergic rhinitis: Secondary | ICD-10-CM | POA: Diagnosis not present

## 2022-04-27 MED ORDER — SEMAGLUTIDE-WEIGHT MANAGEMENT 0.25 MG/0.5ML ~~LOC~~ SOAJ: 0.2500 mg | SUBCUTANEOUS | 3 refills | Status: DC

## 2022-04-27 NOTE — Patient Instructions (Addendum)
F/U in 3 months, call if you need me sooner   Blood pressure is good  Please let us know if wegovy is not available or approved  Lipid, cmp and EGFr  It is important that you exercise regularly at least 30 minutes 5 times a week. If you develop chest pain, have severe difficulty breathing, or feel very tired, stop exercising immediately and seek medical attention   Thanks for choosing [facility], we consider it a privelige to serve you.

## 2022-04-28 LAB — CMP14+EGFR
ALT: 18 IU/L (ref 0–32)
AST: 25 IU/L (ref 0–40)
Albumin/Globulin Ratio: 1.5 (ref 1.2–2.2)
Albumin: 4.2 g/dL (ref 3.8–4.8)
Alkaline Phosphatase: 123 IU/L — ABNORMAL HIGH (ref 44–121)
BUN/Creatinine Ratio: 20 (ref 12–28)
BUN: 18 mg/dL (ref 8–27)
Bilirubin Total: 0.4 mg/dL (ref 0.0–1.2)
CO2: 26 mmol/L (ref 20–29)
Calcium: 9.3 mg/dL (ref 8.7–10.3)
Chloride: 100 mmol/L (ref 96–106)
Creatinine, Ser: 0.9 mg/dL (ref 0.57–1.00)
Globulin, Total: 2.8 g/dL (ref 1.5–4.5)
Glucose: 84 mg/dL (ref 70–99)
Potassium: 3.7 mmol/L (ref 3.5–5.2)
Sodium: 142 mmol/L (ref 134–144)
Total Protein: 7 g/dL (ref 6.0–8.5)
eGFR: 68 mL/min/{1.73_m2} (ref >59)

## 2022-04-28 LAB — LIPID PANEL
Chol/HDL Ratio: 3.1 ratio (ref 0.0–4.4)
Cholesterol, Total: 148 mg/dL (ref 100–199)
HDL: 47 mg/dL (ref >39)
LDL Chol Calc (NIH): 83 mg/dL (ref 0–99)
Triglycerides: 94 mg/dL (ref 0–149)
VLDL Cholesterol Cal: 18 mg/dL (ref 5–40)

## 2022-04-29 ENCOUNTER — Ambulatory Visit: Payer: Medicare HMO | Admitting: Family Medicine

## 2022-05-03 NOTE — Progress Notes (Signed)
Michele Lowe     MRN: [id number]      DOB: [birth year]   HPI Michele Lowe is here for follow up and re-evaluation of chronic medical conditions, medication management and review of any available recent lab and radiology data.  Preventive health is updated, specifically  Cancer screening and Immunization.   Questions or concerns regarding consultations or procedures which the PT has had in the interim are  addressed. The PT denies any adverse reactions to current medications since the last visit.  Persisitent pain and swelling of left leg but ioer 50% improved, still trying to get support stockings that fit correctly   ROS Denies recent fever or chills. Denies sinus pressure, nasal congestion, ear pain or sore throat. Denies chest congestion, productive cough or wheezing. Denies chest pains, palpitations chronic left leg swelling Denies abdominal pain, nausea, vomiting,diarrhea or constipation.   Denies dysuria, frequency, hesitancy or incontinence. Chronic  joint pain, swelling and limitation in mobility. Denies headaches, seizures, numbness, or tingling. Denies depression, anxiety or insomnia. Denies skin break down or rash.   PE  BP 130/80   Pulse 80   Ht 5\' 7"  (1.702 m)   Wt 298 lb 0.6 oz (135.2 kg)   SpO2 97%   BMI 46.68 kg/m   Patient alert and oriented and in no cardiopulmonary distress.  HEENT: No facial asymmetry, EOMI,     Neck supple .  Chest: Clear to auscultation bilaterally.  CVS: S1, S2 no murmurs, no S3.Regular rate.  ABD: Soft non tender.   Ext: No edema  MS: Adequate though reduced  ROM spine, shoulders, hips and knees.  Skin: Intact, no ulcerations or rash noted.  Psych: Good eye contact, normal affect. Memory intact not anxious or depressed appearing.  CNS: CN 2-12 intact, power,  normal throughout.no focal deficits noted.   Assessment & Plan  Essential hypertension Controlled, no change in medication DASH diet and commitment to daily  physical activity for a minimum of 30 minutes discussed and encouraged, as a part of hypertension management. The importance of attaining a healthy weight is also discussed.     04/27/2022   10:49 AM 04/27/2022   10:24 AM 02/18/2022    4:35 PM 02/18/2022    3:44 PM 02/04/2022    8:33 AM 01/26/2022    1:19 PM 01/22/2022   10:29 AM  BP/Weight  Systolic BP [id number]  Diastolic BP 80 68 82 83 82 82 75  Wt. (Lbs)  298.04  290.12 288.04 282 287.04  BMI  46.68 kg/m2  45.44 kg/m2 45.11 kg/m2 44.17 kg/m2 44.96 kg/m2       Chronic venous insufficiency Resolution of acute cellulitis, needs to purchase appropriately fitting hose  Pain and swelling of left lower leg Persists   MORBID OBESITY  Patient re-educated about  the importance of commitment to a  minimum of 150 minutes of exercise per week as able.  The importance of healthy food choices with portion control discussed, as well as eating regularly and within a 12 hour window most days. The need to choose "clean , green" food 50 to 75% of the time is discussed, as well as to make water the primary drink and set a goal of 64 ounces water daily.       04/27/2022   10:24 AM 02/18/2022    3:44 PM 02/04/2022    8:33 AM  Weight /BMI  Weight 298 lb 0.6 oz 290 lb 1.9 oz 288 lb  0.6 oz  Height 5\' 7"  (1.702 m) 5\' 7"  (1.702 m) 5\' 7"  (1.702 m)  BMI 46.68 kg/m2 45.44 kg/m2 45.11 kg/m2    Will try to get wegovy, will hear from ins  Hyperlipidemia LDL goal <100 Hyperlipidemia:Low fat diet discussed and encouraged. Updated lab needed at/ before next visit.    Lipid Panel  Lab Results  Component Value Date   CHOL 148 04/27/2022   HDL 47 04/27/2022   LDLCALC 83 04/27/2022   TRIG 94 04/27/2022   CHOLHDL 3.1 04/27/2022     Controlled, no change in medication   Allergic rhinitis Controlled, no change in medication

## 2022-05-03 NOTE — Assessment & Plan Note (Addendum)
Hyperlipidemia:Low fat diet discussed and encouraged. Updated lab needed at/ before next visit.    Lipid Panel  Lab Results  Component Value Date   CHOL 148 04/27/2022   HDL 47 04/27/2022   LDLCALC 83 04/27/2022   TRIG 94 04/27/2022   CHOLHDL 3.1 04/27/2022     Controlled, no change in medication

## 2022-05-03 NOTE — Assessment & Plan Note (Signed)
Resolution of acute cellulitis, needs to purchase appropriately fitting hose

## 2022-05-03 NOTE — Assessment & Plan Note (Signed)
Persists.  

## 2022-05-03 NOTE — Assessment & Plan Note (Signed)
  Patient re-educated about  the importance of commitment to a  minimum of 150 minutes of exercise per week as able.  The importance of healthy food choices with portion control discussed, as well as eating regularly and within a 12 hour window most days. The need to choose "clean , green" food 50 to 75% of the time is discussed, as well as to make water the primary drink and set a goal of 64 ounces water daily.       04/27/2022   10:24 AM 02/18/2022    3:44 PM 02/04/2022    8:33 AM  Weight /BMI  Weight 298 lb 0.6 oz 290 lb 1.9 oz 288 lb 0.6 oz  Height 5\' 7"  (1.702 m) 5\' 7"  (1.702 m) 5\' 7"  (1.702 m)  BMI 46.68 kg/m2 45.44 kg/m2 45.11 kg/m2    Will try to get wegovy, will hear from ins

## 2022-05-03 NOTE — Assessment & Plan Note (Signed)
Controlled, no change in medication DASH diet and commitment to daily physical activity for a minimum of 30 minutes discussed and encouraged, as a part of hypertension management. The importance of attaining a healthy weight is also discussed.     04/27/2022   10:49 AM 04/27/2022   10:24 AM 02/18/2022    4:35 PM 02/18/2022    3:44 PM 02/04/2022    8:33 AM 01/26/2022    1:19 PM 01/22/2022   10:29 AM  BP/Weight  Systolic BP [id number] 0000000 [id number] AB-[id number]  Diastolic BP 80 68 82 83 82 82 75  Wt. (Lbs)  298.04  290.12 288.04 282 287.04  BMI  46.68 kg/m2  45.44 kg/m2 45.11 kg/m2 44.17 kg/m2 44.96 kg/m2

## 2022-05-03 NOTE — Assessment & Plan Note (Signed)
Controlled, no change in medication  

## 2022-05-10 ENCOUNTER — Other Ambulatory Visit: Payer: Self-pay

## 2022-05-10 MED ORDER — SEMAGLUTIDE-WEIGHT MANAGEMENT 0.25 MG/0.5ML ~~LOC~~ SOAJ: 0.2500 mg | SUBCUTANEOUS | 3 refills | Status: DC

## 2022-06-17 ENCOUNTER — Other Ambulatory Visit: Payer: Self-pay | Admitting: Family Medicine

## 2022-07-17 ENCOUNTER — Other Ambulatory Visit: Payer: Self-pay | Admitting: Family Medicine

## 2022-07-19 ENCOUNTER — Other Ambulatory Visit: Payer: Self-pay | Admitting: Family Medicine

## 2022-07-24 ENCOUNTER — Other Ambulatory Visit: Payer: Self-pay | Admitting: Family Medicine

## 2022-07-27 ENCOUNTER — Other Ambulatory Visit: Payer: Self-pay | Admitting: Family Medicine

## 2022-07-29 ENCOUNTER — Other Ambulatory Visit: Payer: Self-pay | Admitting: Family Medicine

## 2022-07-30 ENCOUNTER — Other Ambulatory Visit: Payer: Self-pay | Admitting: Family Medicine

## 2022-08-03 ENCOUNTER — Ambulatory Visit: Payer: Medicare HMO | Admitting: Family Medicine

## 2022-08-05 ENCOUNTER — Encounter: Payer: Self-pay | Admitting: Family Medicine

## 2022-08-05 ENCOUNTER — Ambulatory Visit (INDEPENDENT_AMBULATORY_CARE_PROVIDER_SITE_OTHER): Payer: Medicare HMO | Admitting: Family Medicine

## 2022-08-05 VITALS — BP 144/86 | HR 78 | Ht 67.0 in | Wt 299.1 lb

## 2022-08-05 DIAGNOSIS — R296 Repeated falls: Secondary | ICD-10-CM

## 2022-08-05 DIAGNOSIS — I10 Essential (primary) hypertension: Secondary | ICD-10-CM | POA: Diagnosis not present

## 2022-08-05 DIAGNOSIS — E785 Hyperlipidemia, unspecified: Secondary | ICD-10-CM | POA: Diagnosis not present

## 2022-08-05 DIAGNOSIS — M1991 Primary osteoarthritis, unspecified site: Secondary | ICD-10-CM

## 2022-08-05 DIAGNOSIS — M150 Primary generalized (osteo)arthritis: Secondary | ICD-10-CM | POA: Diagnosis not present

## 2022-08-05 DIAGNOSIS — R2681 Unsteadiness on feet: Secondary | ICD-10-CM | POA: Diagnosis not present

## 2022-08-05 NOTE — Patient Instructions (Signed)
In 8 weeks to reevaluate blood pressure, call if you need me sooner.  New higher dose of triamterene is 2 tablets once every morning start tomorrow.  Please get fasting lipid CMP and EGFR 3 to 7 days before your follow-up visit.  You are being referred to physical therapy twice weekly for 6 weeks daily & at the facility you to before seen for a because of recurrent falls and unsteady gait.  You have reported 2 falls in the past 2 months.  Be careful not to fall, please have proper lighting when you are walking around and keep your floor clutter free.  Thanks for choosing [facility], we consider it a privelige to serve you.

## 2022-08-05 NOTE — Progress Notes (Signed)
Michele Lowe     MRN: [id number]      DOB: [birth year]  Chief Complaint  Patient presents with   Follow-up    Follow up falls recently, pulled something in her back   2 falls in 2 months, walking down incline to deck from inside of her  home, on 06/03/2022, fell on right side, abrasion tolateral right knee and forearm, ambulating and going tostretching classes since the incident, did not seek medical evaluation Larey Seat face forward when bending to pick up something from the floor , landed on right shoulder, no lightheadeness or diziness, lost balance, no head trauma 06/22/2022 On 08/03/2022 after bending to remove ziploc bag from drawer when straighteneing experienced sharp pain in rightlower back non radiating had a pain of 10 which has totally resolved 2 aspirin tabs x  1 dose Stressed as has been nable to live in her home over past several months, repairs needed and ins not paying, eating uncontrolled with regain  HPI Michele Lowe is here for follow up and re-evaluation of chronic medical conditions, medication management and review of any available recent lab and radiology data.  Preventive health is updated, specifically  Cancer screening and Immunization.   Questions or concerns regarding consultations or procedures which the PT has had in the interim are  addressed. The PT denies any adverse reactions to current medications since the last visit.  OS Denies recent fever or chills. Denies sinus pressure, nasal congestion, ear pain or sore throat. Denies chest congestion, productive cough or wheezing. Denies chest pains, palpitations and leg swelling Denies abdominal pain, nausea, vomiting,diarrhea or constipation.   Denies dysuria, frequency, hesitancy or incontinence. Denies headaches, seizures, numbness, or tingling. Denies depression, anxiety or insomnia. Denies skin break down or rash.   PE  BP (!) 144/86   Pulse 78   Ht 5\' 7"  (1.702 m)   Wt 299 lb 1.9 oz (135.7 kg)   SpO2  97%   BMI 46.85 kg/m   Patient alert and oriented and in no cardiopulmonary distress.  HEENT: No facial asymmetry, EOMI,     Neck supple .  Chest: Clear to auscultation bilaterally.  CVS: S1, S2 no murmurs, no S3.Regular rate.  ABD: Soft non tender.   Ext: No edema  MS: Adequate ROM spine, shoulders, hips and knees.  Skin: Intact, no ulcerations or rash noted.  Psych: Good eye contact, normal affect. Memory intact not anxious or depressed appearing.  CNS: CN 2-12 intact, power,  normal throughout.no focal deficits noted.   Assessment & Plan  Recurrent falls Reports 2 falls in past 6 weeks, has severe generalized osteo which limits safe mobility and notes will benefit from and needs PT, refer to c facility she has been to in the past twice weekly x 6 weeks  Primary generalized (osteo)arthritis Increased stiffness with recent  h/o 2 falls , refer PT  Unsteady gait PT twice weekly for 6 weeks to improve quality of life and for safety  MORBID OBESITY  Patient re-educated about  the importance of commitment to a  minimum of 150 minutes of exercise per week as able.  The importance of healthy food choices with portion control discussed, as well as eating regularly and within a 12 hour window most days. The need to choose "clean , green" food 50 to 75% of the time is discussed, as well as to make water the primary drink and set a goal of 64 ounces water daily.  08/05/2022    2:49 PM 04/27/2022   10:24 AM 02/18/2022    3:44 PM  Weight /BMI  Weight 299 lb 1.9 oz 298 lb 0.6 oz 290 lb 1.9 oz  Height 5\' 7"  (1.702 m) 5\' 7"  (1.702 m) 5\' 7"  (1.702 m)  BMI 46.85 kg/m2 46.68 kg/m2 45.44 kg/m2    Unchanged x 3 months  Essential hypertension DASH diet and commitment to daily physical activity for a minimum of 30 minutes discussed and encouraged, as a part of hypertension management. The importance of attaining a healthy weight is also discussed.     08/05/2022    3:46 PM  08/05/2022    2:51 PM 08/05/2022    2:49 PM 04/27/2022   10:49 AM 04/27/2022   10:24 AM 02/18/2022    4:35 PM 02/18/2022    3:44 PM  BP/Weight  Systolic BP 144 143 145 130 135 128 142  Diastolic BP 86 80 79 80 68 82 83  Wt. (Lbs)   299.12  298.04  290.12  BMI   46.85 kg/m2  46.68 kg/m2  45.44 kg/m2     unControlled, increase maxzide to 75/50 totl daily and re eval in 8 weeks   Hyperlipidemia LDL goal <100 Hyperlipidemia:Low fat diet discussed and encouraged.   Lipid Panel  Lab Results  Component Value Date   CHOL 148 04/27/2022   HDL 47 04/27/2022   LDLCALC 83 04/27/2022   TRIG 94 04/27/2022   CHOLHDL 3.1 04/27/2022     Controlled, no change in medication

## 2022-08-09 DIAGNOSIS — M150 Primary generalized (osteo)arthritis: Secondary | ICD-10-CM | POA: Insufficient documentation

## 2022-08-09 DIAGNOSIS — R296 Repeated falls: Secondary | ICD-10-CM | POA: Insufficient documentation

## 2022-08-09 DIAGNOSIS — R2681 Unsteadiness on feet: Secondary | ICD-10-CM | POA: Insufficient documentation

## 2022-08-09 NOTE — Assessment & Plan Note (Signed)
Reports 2 falls in past 6 weeks, has severe generalized osteo which limits safe mobility and notes will benefit from and needs PT, refer to c facility she has been to in the past twice weekly x 6 weeks

## 2022-08-09 NOTE — Assessment & Plan Note (Signed)
  Patient re-educated about  the importance of commitment to a  minimum of 150 minutes of exercise per week as able.  The importance of healthy food choices with portion control discussed, as well as eating regularly and within a 12 hour window most days. The need to choose "clean , green" food 50 to 75% of the time is discussed, as well as to make water the primary drink and set a goal of 64 ounces water daily.       08/05/2022    2:49 PM 04/27/2022   10:24 AM 02/18/2022    3:44 PM  Weight /BMI  Weight 299 lb 1.9 oz 298 lb 0.6 oz 290 lb 1.9 oz  Height 5\' 7"  (1.702 m) 5\' 7"  (1.702 m) 5\' 7"  (1.702 m)  BMI 46.85 kg/m2 46.68 kg/m2 45.44 kg/m2    Unchanged x 3 months

## 2022-08-09 NOTE — Assessment & Plan Note (Addendum)
DASH diet and commitment to daily physical activity for a minimum of 30 minutes discussed and encouraged, as a part of hypertension management. The importance of attaining a healthy weight is also discussed.     08/05/2022    3:46 PM 08/05/2022    2:51 PM 08/05/2022    2:49 PM 04/27/2022   10:49 AM 04/27/2022   10:24 AM 02/18/2022    4:35 PM 02/18/2022    3:44 PM  BP/Weight  Systolic BP 144 143 145 130 135 128 142  Diastolic BP 86 80 79 80 68 82 83  Wt. (Lbs)   299.12  298.04  290.12  BMI   46.85 kg/m2  46.68 kg/m2  45.44 kg/m2     unControlled, increase maxzide to 75/50 totl daily and re eval in 8 weeks

## 2022-08-09 NOTE — Assessment & Plan Note (Signed)
Increased stiffness with recent  h/o 2 falls , refer PT

## 2022-08-09 NOTE — Assessment & Plan Note (Signed)
Hyperlipidemia:Low fat diet discussed and encouraged.   Lipid Panel  Lab Results  Component Value Date   CHOL 148 04/27/2022   HDL 47 04/27/2022   LDLCALC 83 04/27/2022   TRIG 94 04/27/2022   CHOLHDL 3.1 04/27/2022     Controlled, no change in medication

## 2022-08-09 NOTE — Assessment & Plan Note (Signed)
PT twice weekly for 6 weeks to improve quality of life and for safety

## 2022-09-03 ENCOUNTER — Ambulatory Visit: Payer: Medicare HMO | Admitting: Family Medicine

## 2022-09-12 NOTE — Therapy (Unsigned)
OUTPATIENT PHYSICAL THERAPY LOWER EXTREMITY EVALUATION   Patient Name: Michele Lowe MRN: [id number] DOB:03-May-1950, 72 y.o., female Today's Date: 09/13/2022  END OF SESSION:  PT End of Session - 09/13/22 1515    Visit Number 1    Number of Visits 12    Date for PT Re-Evaluation 10/25/22    Authorization Type humana    PT Start Time 1436    PT Stop Time 1515    PT Time Calculation (min) 39 min    Activity Tolerance Patient tolerated treatment well             Past Medical History:  Diagnosis Date   Allergy    Annual physical exam 08/04/2015   Annual visit for general adult medical examination with abnormal findings 01/24/2022   Arthritis    Cancer (HCC) 06/20/2014   MELANOMA, LEFT  LOWER ARM   Educated about COVID-19 virus infection 07/01/2018   Essential hypertension, benign 2009   Hypertension    Phreesia 09/09/2019   KNEE PAIN, RIGHT 09/13/2008   Qualifier: Diagnosis of  By: Lillia Mountain LPN, Brandi   Qualifier: Diagnosis of  By: Lodema Hong MD, Margaret     Metabolic syndrome X 03/23/2011   Morbid obesity (HCC)    Need for influenza vaccination 11/27/2018   Rhinosinusitis    Past Surgical History:  Procedure Laterality Date   ABDOMINAL HYSTERECTOMY     Partial    JOINT REPLACEMENT  05/13/2011   Revision of left knee replacement in 2008   MOLE REMOVAL  06/20/2014   Dr Elmer Ramp   Partial hysterectomy ,secondary to fibroid . Pt still have both ovaries  02/16/1976   Rt. forearm surgery for nerve repair  02/16/1987   TOTAL KNEE ARTHROPLASTY  02/15/2005   Right    TUBAL LIGATION  5/?/1980   Patient Active Problem List   Diagnosis Date Noted   Recurrent falls 08/09/2022   Unsteady gait 08/09/2022   Primary generalized (osteo)arthritis 08/09/2022   Hives 02/18/2022   Chronic venous insufficiency 01/26/2022   Cellulitis of left lower extremity 01/24/2022   Pain and swelling of left lower leg 01/20/2022   Reduced vision 12/16/2020   Hyperlipidemia LDL goal <100  09/02/2020   Knee pain, right 12/08/2019   GERD (gastroesophageal reflux disease) 04/12/2013   Allergic rhinitis 08/18/2008   MORBID OBESITY 09/13/2007   Essential hypertension 09/13/2007    PCP: Syliva Overman  REFERRING PROVIDER: Kerri Perches, MD  REFERRING DIAG:  R29.6 (ICD-10-CM) - Recurrent falls  R26.81 (ICD-10-CM) - Unsteady gait  M19.91 (ICD-10-CM) - Primary osteoarthritis, unspecified site    THERAPY DIAG:  Muscle weakness Hx of falls   Rationale for Evaluation and Treatment: Rehabilitation  ONSET DATE: 06/03/22    SUBJECTIVE STATEMENT: Pt states that she has had two falls.  She has a burning sensation in her Lt leg and Rt knee pain.  She has had two knee surgeries on her Lt knee and needs to have surgery on her RT but has to lose some wt first.   PERTINENT HISTORY: 2 falls in 2 months, walking down incline to deck from inside of her  home, on 06/03/2022, fell on right side, abrasion tolateral right knee and forearm, ambulating and going tostretching classes since the incident, did not seek medical evaluation Larey Seat face forward when bending to pick up something from the floor , landed on right shoulder, no lightheadeness or diziness, lost balance, no head trauma 06/22/2022 PAIN:  Are you having pain? Yes: NPRS scale: 8/10  this is not what pt is being seen for.  Pain location: Rt knee Pain description: throb  Aggravating factors: WB Relieving factors: rest   PRECAUTIONS: Fall     WEIGHT BEARING RESTRICTIONS: No  FALLS:  Has patient fallen in last 6 months? Yes. Number of falls 2  LIVING ENVIRONMENT: Lives with: lives with their family Lives in: House/apartment Stairs: Yes: External: 4 steps; on right going up  having difficulty stepping up   Has following equipment at home: Single point cane  OCCUPATION: retired   PLOF: Independent  PATIENT GOALS: to stop falling   NEXT MD VISIT: 10/11/22  OBJECTIVE:    COGNITION: Overall cognitive  status: Within functional limits for tasks assessed     SENSATION: Per pt Lt leg has some numbness   EDEMA:  LT Leg has swelling   POSTURE: rounded shoulders and increased thoracic kyphosis   LOWER EXTREMITY ROM: WFL   LOWER EXTREMITY MMT:  MMT Right eval Left eval  Hip flexion 5 3  Hip extension 3 3  Hip abduction 5 3-  Hip adduction    Hip internal rotation    Hip external rotation    Knee flexion 4 4+  Knee extension 5 5  Ankle dorsiflexion 5 5  Ankle plantarflexion    Ankle inversion    Ankle eversion     (Blank rows = not tested)    FUNCTIONAL TESTS:  30 seconds chair stand test:  6 x - 7 is poor for pt age and sex  2 minute walk test:298 ft with cane  RT: 0"   ,  LT:   1"   TODAY'S TREATMENT:                                                                                                                              DATE: 09/13/22:  Evaluation   Supine Bridge  5-10 reps - 3-5" hold - Supine Active Straight Leg Raise  -  5 -  - Sit to Stand with Counter Support  -5 PATIENT EDUCATION:  Education details: Pt Person educated: Patient Education method: Chief Technology Officer Education comprehension: returned demonstration  HOME EXERCISE PROGRAM: Access Code: [email or web address] URL: [email or web address] Date: 09/13/2022 Prepared by: Virgina Organ  Exercises - Supine Bridge  - 2 x daily - 7 x weekly - 1 sets - 5-10 reps - 3-5" hold - Supine Active Straight Leg Raise  - 2 x daily - 7 x weekly - 1 sets - 5-10 reps - 2-5 hold - Sit to Stand with Counter Support  - 2 x daily - 7 x weekly - 1 sets - 10 reps  ASSESSMENT:  CLINICAL IMPRESSION: Patient is a 72 y.o. female who was seen today for physical therapy evaluation and treatment for evaluation and treatment .  Evaluation demonstrates decreased strength, decreased balance and decreased activity tolerance. PT will benefit from skilled PT to address these issues and stop the pt from falling.  OBJECTIVE IMPAIRMENTS: Abnormal gait, decreased activity tolerance, decreased balance, decreased strength, obesity, and pain.   ACTIVITY LIMITATIONS: carrying, standing, squatting, stairs, and locomotion level  PARTICIPATION LIMITATIONS: cleaning, community activity, yard work, and church  PERSONAL FACTORS: Age, Education, Time since onset of injury/illness/exacerbation, and 1 comorbidity: OA  are also affecting patient's functional outcome.   REHAB POTENTIAL: Good  CLINICAL DECISION MAKING: Stable/uncomplicated  EVALUATION COMPLEXITY: Moderate   GOALS: Goals reviewed with patient? No  SHORT TERM GOALS: Target date: 10/04/22 PT to be I in HEP in order to increase her strength 1/2 grade to be able to get in and out of a chair with ease  Baseline: Goal status: INITIAL  2.  PT to be able to single leg stance x 8 seconds to reduce risk of falls.  Baseline:  Goal status: INITIAL  3.   Pt to be able to complete 8 sit to stand in a 30 second period  Baseline:  Goal status: INITIAL   LONG TERM GOALS: Target date: 10/25/2022  PT to be I in HEP in order to increase her strength 1 grade to be able to go up and down 4 steps in a reciprocal manner with ease. Baseline:  Goal status: INITIAL  2.  PT to be able to single leg stance x 20 seconds to reduce risk of falls. Baseline:  Goal status: INITIAL  3.   Pt to be able to complete 11 sit to stand in a 30 second period  Baseline:  Goal status: INITIAL     PLAN:  PT FREQUENCY: 2x/week  PT DURATION: 6 weeks  PLANNED INTERVENTIONS: Therapeutic exercises, Therapeutic activity, Neuromuscular re-education, Balance training, Gait training, Patient/Family education, and Self Care  PLAN FOR NEXT SESSION: balance and strengthening exercises   Virgina Organ, PT CLT [phone]  09/13/2022, 3:35 PM

## 2022-09-13 ENCOUNTER — Ambulatory Visit (HOSPITAL_COMMUNITY): Payer: Medicare HMO | Attending: Family Medicine | Admitting: Physical Therapy

## 2022-09-13 ENCOUNTER — Other Ambulatory Visit: Payer: Self-pay

## 2022-09-13 DIAGNOSIS — R296 Repeated falls: Secondary | ICD-10-CM | POA: Insufficient documentation

## 2022-09-13 DIAGNOSIS — R2681 Unsteadiness on feet: Secondary | ICD-10-CM | POA: Diagnosis not present

## 2022-09-13 DIAGNOSIS — M6281 Muscle weakness (generalized): Secondary | ICD-10-CM | POA: Diagnosis not present

## 2022-09-13 DIAGNOSIS — M1991 Primary osteoarthritis, unspecified site: Secondary | ICD-10-CM | POA: Insufficient documentation

## 2022-10-07 ENCOUNTER — Ambulatory Visit (INDEPENDENT_AMBULATORY_CARE_PROVIDER_SITE_OTHER): Payer: Medicare HMO

## 2022-10-07 VITALS — Ht 67.0 in | Wt 297.0 lb

## 2022-10-07 DIAGNOSIS — Z0000 Encounter for general adult medical examination without abnormal findings: Secondary | ICD-10-CM

## 2022-10-07 DIAGNOSIS — Z780 Asymptomatic menopausal state: Secondary | ICD-10-CM

## 2022-10-07 NOTE — Progress Notes (Signed)
 Because this visit was a virtual/telehealth visit,  certain criteria was not obtained, such a blood pressure, CBG if patient is a diabetic, and timed get up and go. Any medications not marked as "taking" was not mentioned during the medication reconciliation part of the visit. Any vitals not documented were not able to be obtained due to this being a telehealth visit. Vitals that have been documented are verbally provided by the patient.  Patient was unable to self-report a recent blood pressure reading due to a lack of equipment at home via telehealth.  Subjective:   Michele Lowe is a 72 y.o. female who presents for Medicare Annual (Subsequent) preventive examination.  Visit Complete: Virtual  I connected with  Dan Europe on 10/07/22 by a audio enabled telemedicine application and verified that I am speaking with the correct person using two identifiers.  Patient Location: Home  Provider Location: Home Office  I discussed the limitations of evaluation and management by telemedicine. The patient expressed understanding and agreed to proceed.  Patient Medicare AWV questionnaire was completed by the patient on 10/06/2022; I have confirmed that all information answered by patient is correct and no changes since this date.  Review of Systems     Cardiac Risk Factors include: advanced age (>81men, >48 women);dyslipidemia;hypertension;obesity (BMI >30kg/m2);sedentary lifestyle     Objective:    Today's Vitals   10/06/22 1918 10/07/22 0842  Weight:  297 lb (134.7 kg)  Height:  5\' 7"  (1.702 m)  PainSc: 6     Body mass index is 46.52 kg/m.     10/07/2022    8:42 AM 09/13/2022    2:35 PM 01/20/2022   10:10 AM 09/14/2021   10:53 AM 09/12/2020   11:17 AM 09/05/2017    8:58 AM 09/01/2016   10:55 AM  Advanced Directives  Does Patient Have a Medical Advance Directive? No No Yes Yes No No No  Type of Advance Directive   Living will Living will     Does patient want to make changes to  medical advance directive?    No - Patient declined     Would patient like information on creating a medical advance directive? No - Patient declined    No - Patient declined Yes (ED - Information included in AVS) No - Patient declined    Current Medications (verified) Outpatient Encounter Medications as of 10/07/2022  Medication Sig   acetaminophen (TYLENOL 8 HOUR) 650 MG CR tablet Take 1 tablet (650 mg total) by mouth every 8 (eight) hours.   Black Currant Seed Oil 500 MG CAPS Take by mouth.   calcium-vitamin D (OSCAL WITH D) 500-200 MG-UNIT tablet Take 1 tablet by mouth 2 (two) times daily.   furosemide (LASIX) 40 MG tablet TAKE 1 TABLET EVERY DAY   loratadine (CLARITIN) 10 MG tablet Take 1 tablet (10 mg total) by mouth daily.   Multiple Vitamin (MULTIVITAMIN PO) Take by mouth daily.   Omega-3 Fatty Acids (FISH OIL) 1000 MG CAPS Take 1 capsule by mouth daily.   OVER THE COUNTER MEDICATION Tumeric 2250 mg  1 - 2 per day   OVER THE COUNTER MEDICATION glucosamine 1500 mg Chondroitin 1200 mg  one daily   OVER THE COUNTER MEDICATION Aspercreme with 4% lidocaine roll on liquid. Apply to right knee for pain as needed.   potassium chloride SA (KLOR-CON M) 20 MEQ tablet TAKE 1 TABLET EVERY DAY   rosuvastatin (CRESTOR) 5 MG tablet Take one tablet by mouth every Monday, Wednesday  and Friday   traMADol (ULTRAM) 50 MG tablet Take 1 tablet (50 mg total) by mouth every 6 (six) hours as needed for severe pain.   triamterene-hydrochlorothiazide (MAXZIDE-25) 37.5-25 MG tablet Take two tablets by mout once daily starting 08/06/2022   UNABLE TO FIND Med Name: TDAP   oxyCODONE (OXY IR/ROXICODONE) 5 MG immediate release tablet Take by mouth.   No facility-administered encounter medications on file as of 10/07/2022.    Allergies (verified) Codeine, Influenza vaccines, and Oxycodone hcl   History: Past Medical History:  Diagnosis Date   Allergy    Annual physical exam 08/04/2015   Annual visit for  general adult medical examination with abnormal findings 01/24/2022   Arthritis    Cancer (HCC) 06/20/2014   MELANOMA, LEFT  LOWER ARM   Educated about COVID-19 virus infection 07/01/2018   Essential hypertension, benign 2009   Hypertension    Phreesia 09/09/2019   KNEE PAIN, RIGHT 09/13/2008   Qualifier: Diagnosis of  By: Lillia Mountain LPN, Brandi   Qualifier: Diagnosis of  By: Lodema Hong MD, Margaret     Metabolic syndrome X 03/23/2011   Morbid obesity (HCC)    Need for influenza vaccination 11/27/2018   Rhinosinusitis    Past Surgical History:  Procedure Laterality Date   ABDOMINAL HYSTERECTOMY     Partial    JOINT REPLACEMENT  05/13/2011   Revision of left knee replacement in 2008   MOLE REMOVAL  06/20/2014   Dr Elmer Ramp   Partial hysterectomy ,secondary to fibroid . Pt still have both ovaries  02/16/1976   Rt. forearm surgery for nerve repair  02/16/1987   TOTAL KNEE ARTHROPLASTY  02/15/2005   Right    TUBAL LIGATION  5/?/1980   Family History  Problem Relation Age of Onset   Heart failure Mother    Hypertension Mother    Heart disease Mother        Irregular heart valve   Miscarriages / Stillbirths Mother    Obesity Mother    Hypertension Father    Emphysema Father    Heart disease Father    COPD Father    Arthritis Father    Diabetes Brother    Heart failure Brother    Diabetes Brother    Diabetes Brother    Hyperlipidemia Brother    Hypertension Sister    Hypertension Sister    Social History   Socioeconomic History   Marital status: Divorced    Spouse name: Not on file   Number of children: 3   Years of education: Not on file   Highest education level: 12th grade  Occupational History   Occupation: Disabled   Tobacco Use   Smoking status: Never   Smokeless tobacco: Never  Substance and Sexual Activity   Alcohol use: Never   Drug use: No   Sexual activity: Not Currently    Birth control/protection: None  Other Topics Concern   Not on file  Social  History Narrative   Not on file   Social Determinants of Health   Financial Resource Strain: Low Risk  (10/06/2022)   Overall Financial Resource Strain (CARDIA)    Difficulty of Paying Living Expenses: Not very hard  Food Insecurity: No Food Insecurity (10/06/2022)   Hunger Vital Sign    Worried About Running Out of Food in the Last Year: Never true    Ran Out of Food in the Last Year: Never true  Transportation Needs: No Transportation Needs (10/06/2022)   PRAPARE - Transportation  Lack of Transportation (Medical): No    Lack of Transportation (Non-Medical): No  Physical Activity: Insufficiently Active (10/06/2022)   Exercise Vital Sign    Days of Exercise per Week: 2 days    Minutes of Exercise per Session: 60 min  Stress: No Stress Concern Present (10/06/2022)   [employer] of Occupational Health - Occupational Stress Questionnaire    Feeling of Stress : Not at all  Social Connections: Moderately Integrated (10/06/2022)   Social Connection and Isolation Panel [NHANES]    Frequency of Communication with Friends and Family: Three times a week    Frequency of Social Gatherings with Friends and Family: Three times a week    Attends Religious Services: More than 4 times per year    Active Member of Clubs or Organizations: Yes    Attends Engineer, structural: More than 4 times per year    Marital Status: Divorced    Tobacco Counseling Counseling given: Yes   Clinical Intake:  Pre-visit preparation completed: Yes  Pain : 0-10 Pain Score: 6  Pain Type: Chronic pain Pain Location: Knee Pain Orientation: Right Pain Descriptors / Indicators: Throbbing, Burning Pain Onset: More than a month ago Pain Frequency: Constant     BMI - recorded: 46.52 Nutritional Status: BMI > 30  Obese Nutritional Risks: None Diabetes: No  How often do you need to have someone help you when you read instructions, pamphlets, or other written materials from your doctor or pharmacy?:  1 - Never  Interpreter Needed?: No  Information entered by ::  Case Vassell, CMA   Activities of Daily Living    10/06/2022    7:18 PM 09/20/2022    9:22 AM  In your present state of health, do you have any difficulty performing the following activities:  Hearing? 0 0  Vision? 0 0  Difficulty concentrating or making decisions? 0 0  Walking or climbing stairs? 1 1  Dressing or bathing? 0 0  Doing errands, shopping? 0 0  Preparing Food and eating ? N N  Using the Toilet? N N  In the past six months, have you accidently leaked urine? Y Y  Do you have problems with loss of bowel control? N N  Managing your Medications? N N  Managing your Finances? N N  Housekeeping or managing your Housekeeping? N N    Patient Care Team: Kerri Perches, MD as PCP - General Irma Newness, MD as Referring Physician (Orthopedic Surgery) Jonelle Sidle, MD as Consulting Physician (Cardiology) Elinor Parkinson, DPM as Consulting Physician (Podiatry)  Indicate any recent Medical Services you may have received from other than [facility] providers in the past year (date may be approximate).     Assessment:   This is a routine wellness examination for Howell.  Hearing/Vision screen Hearing Screening - Comments:: Patient denies any hearing difficulties.   Vision Screening - Comments:: Wears rx glasses - up to date with routine eye exams with [facility] in [place]  Dietary issues and exercise activities discussed:     Goals Addressed             This Visit's Progress    Patient Stated       Continue to lose weight so that I can get my knee replaced.         Depression Screen    10/07/2022    8:51 AM 08/05/2022    2:50 PM 04/27/2022   10:26 AM 02/18/2022    3:46 PM 02/04/2022  8:35 AM 01/22/2022   10:30 AM 01/20/2022    8:16 AM  PHQ 2/9 Scores  PHQ - 2 Score 0 0 0 0 0 1 0  PHQ- 9 Score  2 2        Fall Risk    10/06/2022    7:18 PM 09/20/2022    9:22 AM 08/05/2022     2:50 PM 04/27/2022   10:25 AM 02/18/2022    3:46 PM  Fall Risk   Falls in the past year? 1 1 1  0 0  Number falls in past yr: 1 1 1  0 0  Injury with Fall? 1 1 1  0 0  Risk for fall due to : History of fall(s);Impaired balance/gait;Impaired mobility;Orthopedic patient  History of fall(s);Impaired balance/gait Impaired balance/gait No Fall Risks  Follow up Education provided;Falls prevention discussed  Falls evaluation completed Falls evaluation completed Falls evaluation completed    MEDICARE RISK AT HOME: Medicare Risk at Home Any stairs in or around the home?: Yes If so, are there any without handrails?: Yes Home free of loose throw rugs in walkways, pet beds, electrical cords, etc?: Yes Adequate lighting in your home to reduce risk of falls?: Yes Life alert?: No Use of a cane, walker or w/c?: Yes Grab bars in the bathroom?: No Shower chair or bench in shower?: No Elevated toilet seat or a handicapped toilet?: No  TIMED UP AND GO:  Was the test performed?  No    Cognitive Function:        10/07/2022    8:50 AM 09/14/2021   10:54 AM 09/12/2020   11:25 AM 09/11/2019    2:47 PM 09/07/2018    1:35 PM  6CIT Screen  What Year? 0 points 0 points 0 points 0 points 0 points  What month? 0 points 0 points 0 points 0 points 0 points  What time? 0 points 0 points 0 points 0 points 0 points  Count back from 20 0 points 0 points 0 points 0 points 0 points  Months in reverse 0 points 0 points 0 points 0 points 0 points  Repeat phrase 0 points 0 points 0 points 4 points 0 points  Total Score 0 points 0 points 0 points 4 points 0 points    Immunizations Immunization History  Administered Date(s) Administered   Fluad Quad(high Dose 65+) 11/27/2018, 12/04/2019, 12/16/2020, 01/20/2022   Influenza Whole 11/23/2006   Moderna Sars-Covid-2 Vaccination 05/15/2019, 06/15/2019, 12/25/2019, 12/26/2020   Pneumococcal Conjugate-13 10/31/2013   Pneumococcal Polysaccharide-23 08/04/2015   Td 11/23/2006    Zoster Recombinant(Shingrix) 04/17/2021, 10/16/2021   Zoster, Live 02/13/2009    TDAP status: Up to date  Flu Vaccine status: Due, Education has been provided regarding the importance of this vaccine. Advised may receive this vaccine at local pharmacy or Health Dept. Aware to provide a copy of the vaccination record if obtained from local pharmacy or Health Dept. Verbalized acceptance and understanding.  Pneumococcal vaccine status: Up to date  Covid-19 vaccine status: Information provided on how to obtain vaccines.   Qualifies for Shingles Vaccine? Yes   Zostavax completed Yes   Shingrix Completed?: Yes  Screening Tests Health Maintenance  Topic Date Due   DTaP/Tdap/Td (2 - Tdap) 11/22/2016   COVID-19 Vaccine (5 - 2023-24 season) 10/16/2021   Medicare Annual Wellness (AWV)  09/15/2022   INFLUENZA VACCINE  09/16/2022   MAMMOGRAM  02/12/2023   Fecal DNA (Cologuard)  02/23/2025   Pneumonia Vaccine 66+ Years old  Completed   DEXA SCAN  Completed   Hepatitis C Screening  Completed   Zoster Vaccines- Shingrix  Completed   HPV VACCINES  Aged Out   Colonoscopy  Discontinued    Health Maintenance  Health Maintenance Due  Topic Date Due   DTaP/Tdap/Td (2 - Tdap) 11/22/2016   COVID-19 Vaccine (5 - 2023-24 season) 10/16/2021   Medicare Annual Wellness (AWV)  09/15/2022   INFLUENZA VACCINE  09/16/2022    Colorectal cancer screening: Type of screening: Cologuard. Completed 02/23/2022. Repeat every 3 years  Mammogram status: Completed 02/11/2022. Repeat every year  Bone Density status: Ordered 10/07/2022. Pt provided with contact info and advised to call to schedule appt. Previous Dexa scan showed bone low  Lung Cancer Screening: (Low Dose CT Chest recommended if Age 6-80 years, 20 pack-year currently smoking OR have quit w/in 15years.) does not qualify.   Lung Cancer Screening Referral: na  Additional Screening:  Hepatitis C Screening: does not qualify; Completed  11/28/2014  Vision Screening: Recommended annual ophthalmology exams for early detection of glaucoma and other disorders of the eye. Is the patient up to date with their annual eye exam?  Yes  Who is the provider or what is the name of the office in which the patient attends annual eye exams? [facility] If pt is not established with a provider, would they like to be referred to a provider to establish care? No .   Dental Screening: Recommended annual dental exams for proper oral hygiene  Diabetic Foot Exam: na  Community Resource Referral / Chronic Care Management: CRR required this visit?  No   CCM required this visit?  No     Plan:     I have personally reviewed and noted the following in the patient's chart:   Medical and social history Use of alcohol, tobacco or illicit drugs  Current medications and supplements including opioid prescriptions. Patient is currently taking opioid prescriptions. Information provided to patient regarding non-opioid alternatives. Patient advised to discuss non-opioid treatment plan with their provider. Functional ability and status Nutritional status Physical activity Advanced directives List of other physicians Hospitalizations, surgeries, and ER visits in previous 12 months Vitals Screenings to include cognitive, depression, and falls Referrals and appointments  In addition, I have reviewed and discussed with patient certain preventive protocols, quality metrics, and best practice recommendations. A written personalized care plan for preventive services as well as general preventive health recommendations were provided to patient.     Jordan Hawks [place], CMA   10/07/2022   After Visit Summary: (MyChart) Due to this being a telephonic visit, the after visit summary with patients personalized plan was offered to patient via MyChart   Nurse Notes: Dexa scan ordered.

## 2022-10-07 NOTE — Patient Instructions (Signed)
Ms. Kingry , Thank you for taking time to come for your Medicare Wellness Visit. I appreciate your ongoing commitment to your health goals. Please review the following plan we discussed and let me know if I can assist you in the future.   Referrals/Orders/Follow-Ups/Clinician Recommendations:   This is a list of the screening recommended for you and due dates:  Health Maintenance  Topic Date Due   DTaP/Tdap/Td vaccine (2 - Tdap) 11/22/2016   COVID-19 Vaccine (5 - 2023-24 season) 10/16/2021   Medicare Annual Wellness Visit  09/15/2022   Flu Shot  09/16/2022   Mammogram  02/12/2023   Cologuard (Stool DNA test)  02/23/2025   Pneumonia Vaccine  Completed   DEXA scan (bone density measurement)  Completed   Hepatitis C Screening  Completed   Zoster (Shingles) Vaccine  Completed   HPV Vaccine  Aged Out   Colon Cancer Screening  Discontinued    Advanced directives: (Declined) Advance directive discussed with you today. Even though you declined this today, please call our office should you change your mind, and we can give you the proper paperwork for you to fill out.  Next Medicare Annual Wellness Visit scheduled for next year: Yes November 30, 2023 at 11:30 am telephone visit Preventive Care 74 Years and Older, Female You have an order for:  []   2D Mammogram  []   3D Mammogram  [x]   Bone Density   []   Lung Cancer Screening  Please call for appointment:   [facility] [address] [place], [place] [address] [phone]  Make sure to wear two-piece clothing.  No lotions powders or deodorants the day of the appointment Make sure to bring picture ID and insurance card.  Bring list of medications you are currently taking including any supplements.   Schedule your [place] screening mammogram through MyChart!   Log into your MyChart account.  Go to 'Visit' (or 'Appointments' if on mobile App) --> Schedule an Appointment  Under 'Select a  Reason for Visit' choose the Mammogram Screening option.  Complete the pre-visit questions and select the time and place that best fits your schedule.    Preventive care refers to lifestyle choices and visits with your health care provider that can promote health and wellness. Preventive care visits are also called wellness exams. What can I expect for my preventive care visit? Counseling Your health care provider may ask you questions about your: Medical history, including: Past medical problems. Family medical history. Pregnancy and menstrual history. History of falls. Current health, including: Memory and ability to understand (cognition). Emotional well-being. Home life and relationship well-being. Sexual activity and sexual health. Lifestyle, including: Alcohol, nicotine or tobacco, and drug use. Access to firearms. Diet, exercise, and sleep habits. Work and work Astronomer. Sunscreen use. Safety issues such as seatbelt and bike helmet use. Physical exam Your health care provider will check your: Height and weight. These may be used to calculate your BMI (body mass index). BMI is a measurement that tells if you are at a healthy weight. Waist circumference. This measures the distance around your waistline. This measurement also tells if you are at a healthy weight and may help predict your risk of certain diseases, such as type 2 diabetes and high blood pressure. Heart rate and blood pressure. Body temperature. Skin for abnormal spots. What immunizations do I need?  Vaccines are usually given at various ages, according to a schedule. Your health care provider will recommend vaccines for  you based on your age, medical history, and lifestyle or other factors, such as travel or where you work. What tests do I need? Screening Your health care provider may recommend screening tests for certain conditions. This may include: Lipid and cholesterol levels. Hepatitis C  test. Hepatitis B test. HIV (human immunodeficiency virus) test. STI (sexually transmitted infection) testing, if you are at risk. Lung cancer screening. Colorectal cancer screening. Diabetes screening. This is done by checking your blood sugar (glucose) after you have not eaten for a while (fasting). Mammogram. Talk with your health care provider about how often you should have regular mammograms. BRCA-related cancer screening. This may be done if you have a family history of breast, ovarian, tubal, or peritoneal cancers. Bone density scan. This is done to screen for osteoporosis. Talk with your health care provider about your test results, treatment options, and if necessary, the need for more tests. Follow these instructions at home: Eating and drinking  Eat a diet that includes fresh fruits and vegetables, whole grains, lean protein, and low-fat dairy products. Limit your intake of foods with high amounts of sugar, saturated fats, and salt. Take vitamin and mineral supplements as recommended by your health care provider. Do not drink alcohol if your health care provider tells you not to drink. If you drink alcohol: Limit how much you have to 0-1 drink a day. Know how much alcohol is in your drink. In the [place] one drink equals one 12 oz bottle of beer (355 mL), one 5 oz glass of wine (148 mL), or one 1 oz glass of hard liquor (44 mL). Lifestyle Brush your teeth every morning and night with fluoride toothpaste. Floss one time each day. Exercise for at least 30 minutes 5 or more days each week. Do not use any products that contain nicotine or tobacco. These products include cigarettes, chewing tobacco, and vaping devices, such as e-cigarettes. If you need help quitting, ask your health care provider. Do not use drugs. If you are sexually active, practice safe sex. Use a condom or other form of protection in order to prevent STIs. Take aspirin only as told by your health care provider.  Make sure that you understand how much to take and what form to take. Work with your health care provider to find out whether it is safe and beneficial for you to take aspirin daily. Ask your health care provider if you need to take a cholesterol-lowering medicine (statin). Find healthy ways to manage stress, such as: Meditation, yoga, or listening to music. Journaling. Talking to a trusted person. Spending time with friends and family. Minimize exposure to UV radiation to reduce your risk of skin cancer. Safety Always wear your seat belt while driving or riding in a vehicle. Do not drive: If you have been drinking alcohol. Do not ride with someone who has been drinking. When you are tired or distracted. While texting. If you have been using any mind-altering substances or drugs. Wear a helmet and other protective equipment during sports activities. If you have firearms in your house, make sure you follow all gun safety procedures. What's next? Visit your health care provider once a year for an annual wellness visit. Ask your health care provider how often you should have your eyes and teeth checked. Stay up to date on all vaccines. This information is not intended to replace advice given to you by your health care provider. Make sure you discuss any questions you have with your health care provider. Document  Revised: 07/30/2020 Document Reviewed: 07/30/2020 Elsevier Patient Education  2024 [employer]. Understanding Your Risk for Falls Millions of people have serious injuries from falls each year. It is important to understand your risk of falling. Talk with your health care provider about your risk and what you can do to lower it. If you do have a serious fall, make sure to tell your provider. Falling once raises your risk of falling again. How can falls affect me? Serious injuries from falls are common. These include: Broken bones, such as hip fractures. Head injuries, such as  traumatic brain injuries (TBI) or concussions. A fear of falling can cause you to avoid activities and stay at home. This can make your muscles weaker and raise your risk for a fall. What can increase my risk? There are a number of risk factors that increase your risk for falling. The more risk factors you have, the higher your risk of falling. Serious injuries from a fall happen most often to people who are older than 74 years old. Teenagers and young adults ages 11-29 are also at higher risk. Common risk factors include: Weakness in the lower body. Being generally weak or confused due to long-term (chronic) illness. Dizziness or balance problems. Poor vision. Medicines that cause dizziness or drowsiness. These may include: Medicines for your blood pressure, heart, anxiety, insomnia, or swelling (edema). Pain medicines. Muscle relaxants. Other risk factors include: Drinking alcohol. Having had a fall in the past. Having foot pain or wearing improper footwear. Working at a dangerous job. Having any of the following in your home: Tripping hazards, such as floor clutter or loose rugs. Poor lighting. Pets. Having dementia or memory loss. What actions can I take to lower my risk of falling?     Physical activity Stay physically fit. Do strength and balance exercises. Consider taking a regular class to build strength and balance. Yoga and tai chi are good options. Vision Have your eyes checked every year and your prescription for glasses or contacts updated as needed. Shoes and walking aids Wear non-skid shoes. Wear shoes that have rubber soles and low heels. Do not wear high heels. Do not walk around the house in socks or slippers. Use a cane or walker as told by your provider. Home safety Attach secure railings on both sides of your stairs. Install grab bars for your bathtub, shower, and toilet. Use a non-skid mat in your bathtub or shower. Attach bath mats securely with  double-sided, non-slip rug tape. Use good lighting in all rooms. Keep a flashlight near your bed. Make sure there is a clear path from your bed to the bathroom. Use night-lights. Do not use throw rugs. Make sure all carpeting is taped or tacked down securely. Remove all clutter from walkways and stairways, including extension cords. Repair uneven or broken steps and floors. Avoid walking on icy or slippery surfaces. Walk on the grass instead of on icy or slick sidewalks. Use ice melter to get rid of ice on walkways in the winter. Use a cordless phone. Questions to ask your health care provider Can you help me check my risk for a fall? Do any of my medicines make me more likely to fall? Should I take a vitamin D supplement? What exercises can I do to improve my strength and balance? Should I make an appointment to have my vision checked? Do I need a bone density test to check for weak bones (osteoporosis)? Would it help to use a cane or a walker?  Where to find more information Centers for Disease Control and Prevention, [employer]: TonerPromos.no Community-Based Fall Prevention Programs: TonerPromos.no [employer] on Aging: BaseRingTones.pl Contact a health care provider if: You fall at home. You are afraid of falling at home. You feel weak, drowsy, or dizzy. This information is not intended to replace advice given to you by your health care provider. Make sure you discuss any questions you have with your health care provider. Document Revised: 10/05/2021 Document Reviewed: 10/05/2021 Elsevier Patient Education  2024 Elsevier Inc. Bone Density Test A bone density test uses a type of X-ray to measure the amount of calcium and other minerals in a person's bones. It can measure bone density in the hip and the spine. The test is similar to having a regular X-ray. This test may also be called: Bone densitometry. Bone mineral density test. Dual-energy X-ray absorptiometry (DEXA). You may have this test  to: Diagnose a condition that causes weak or thin bones (osteoporosis). Screen you for osteoporosis. Predict your risk for a broken bone (fracture). Determine how well your osteoporosis treatment is working. Tell a health care provider about: Any allergies you have. All medicines you are taking, including vitamins, herbs, eye drops, creams, and over-the-counter medicines. Any problems you or family members have had with anesthetic medicines. Any blood disorders you have. Any surgeries you have had. Any medical conditions you have. Whether you are pregnant or may be pregnant. Any medical tests you have had within the past 14 days that used contrast material. What are the risks? Generally, this is a safe test. However, it does expose you to a small amount of radiation, which can slightly increase your cancer risk. What happens before the test? Do not take any calcium supplements within the 24 hours before your test. You will need to remove all metal jewelry, eyeglasses, removable dental appliances, and any other metal objects on your body. What happens during the test?  You will lie down on an exam table. There will be an X-ray generator below you and an imaging device above you. Other devices, such as boxes or braces, may be used to position your body properly for the scan. The machine will slowly scan your body. You will need to keep very still while the machine does the scan. The images will show up on a screen in the room. Images will be examined by a specialist after your test is finished. The procedure may vary among health care providers and hospitals. What can I expect after the test? It is up to you to get the results of your test. Ask your health care provider, or the department that is doing the test, when your results will be ready. Summary A bone density test is an imaging test that uses a type of X-ray to measure the amount of calcium and other minerals in your bones. The  test may be used to diagnose or screen you for a condition that causes weak or thin bones (osteoporosis), predict your risk for a broken bone (fracture), or determine how well your osteoporosis treatment is working. Do not take any calcium supplements within 24 hours before your test. Ask your health care provider, or the department that is doing the test, when your results will be ready. This information is not intended to replace advice given to you by your health care provider. Make sure you discuss any questions you have with your health care provider. Document Revised: 10/15/2020 Document Reviewed: 07/19/2019 Elsevier Patient Education  2024 [employer].

## 2022-10-08 ENCOUNTER — Ambulatory Visit: Payer: Medicare HMO | Admitting: Family Medicine

## 2022-12-13 ENCOUNTER — Other Ambulatory Visit: Payer: Self-pay | Admitting: Family Medicine

## 2022-12-13 MED ORDER — ROSUVASTATIN CALCIUM 5 MG PO TABS: ORAL_TABLET | ORAL | 0 refills | Status: DC

## 2023-02-16 ENCOUNTER — Other Ambulatory Visit: Payer: Self-pay | Admitting: Family Medicine

## 2023-02-23 ENCOUNTER — Other Ambulatory Visit: Payer: Self-pay | Admitting: Family Medicine

## 2023-04-06 ENCOUNTER — Other Ambulatory Visit: Payer: Self-pay | Admitting: Family Medicine

## 2023-10-11 ENCOUNTER — Ambulatory Visit (INDEPENDENT_AMBULATORY_CARE_PROVIDER_SITE_OTHER): Payer: Medicare HMO

## 2023-10-11 VITALS — BP 120/68 | Ht 67.0 in | Wt 286.0 lb

## 2023-10-11 DIAGNOSIS — Z0000 Encounter for general adult medical examination without abnormal findings: Secondary | ICD-10-CM | POA: Diagnosis not present

## 2023-10-11 DIAGNOSIS — Z780 Asymptomatic menopausal state: Secondary | ICD-10-CM

## 2023-10-11 DIAGNOSIS — Z1231 Encounter for screening mammogram for malignant neoplasm of breast: Secondary | ICD-10-CM

## 2023-10-11 NOTE — Progress Notes (Signed)
 Subjective:   Michele Lowe is a 73 y.o. who presents for a Medicare Wellness preventive visit.  As a reminder, Annual Wellness Visits don't include a physical exam, and some assessments may be limited, especially if this visit is performed virtually. We may recommend an in-person follow-up visit with your provider if needed.  Visit Complete: Virtual I connected with  Michele Lowe on 10/11/23 by a audio enabled telemedicine application and verified that I am speaking with the correct person using two identifiers.  Patient Location: Home  Provider Location: Home Office  I discussed the limitations of evaluation and management by telemedicine. The patient expressed understanding and agreed to proceed.  Vital Signs: Because this visit was a virtual/telehealth visit, some criteria may be missing or patient reported. Any vitals not documented were not able to be obtained and vitals that have been documented are patient reported.  VideoDeclined- This patient declined Librarian, academic. Therefore the visit was completed with audio only.  Persons Participating in Visit: Patient.  AWV Questionnaire: Yes: Patient Medicare AWV questionnaire was completed by the patient on 10/10/2023; I have confirmed that all information answered by patient is correct and no changes since this date.  Cardiac Risk Factors include: advanced age (>91men, >19 women);hypertension;obesity (BMI >30kg/m2)     Objective:    Today's Vitals   10/10/23 1110 10/11/23 1003  BP:  120/68  Weight:  286 lb (129.7 kg)  Height:  5' 7 (1.702 m)  PainSc: 5     Body mass index is 44.79 kg/m.     10/11/2023   10:02 AM 10/07/2022    8:42 AM 09/13/2022    2:35 PM 01/20/2022   10:10 AM 09/14/2021   10:53 AM 09/12/2020   11:17 AM 09/05/2017    8:58 AM  Advanced Directives  Does Patient Have a Medical Advance Directive? No No No Yes Yes No No   Type of Advance Directive    Living will Living  will    Does patient want to make changes to medical advance directive?     No - Patient declined    Would patient like information on creating a medical advance directive? No - Patient declined No - Patient declined    No - Patient declined Yes (ED - Information included in AVS)      Data saved with a previous flowsheet row definition    Current Medications (verified) Outpatient Encounter Medications as of 10/11/2023  Medication Sig   acetaminophen  (TYLENOL  8 HOUR) 650 MG CR tablet Take 1 tablet (650 mg total) by mouth every 8 (eight) hours.   Black Currant Seed Oil 500 MG CAPS Take by mouth.   calcium -vitamin D  (OSCAL WITH D) 500-200 MG-UNIT tablet Take 1 tablet by mouth 2 (two) times daily.   furosemide  (LASIX ) 40 MG tablet TAKE 1 TABLET EVERY DAY   loratadine  (CLARITIN ) 10 MG tablet Take 1 tablet (10 mg total) by mouth daily.   Multiple Vitamin (MULTIVITAMIN PO) Take by mouth daily.   Omega-3 Fatty Acids (FISH OIL) 1000 MG CAPS Take 1 capsule by mouth daily.   OVER THE COUNTER MEDICATION Tumeric 2250 mg  1 - 2 per day   OVER THE COUNTER MEDICATION glucosamine 1500 mg Chondroitin 1200 mg  one daily   OVER THE COUNTER MEDICATION Aspercreme with 4% lidocaine roll on liquid. Apply to right knee for pain as needed.   potassium chloride  SA (KLOR-CON  M) 20 MEQ tablet TAKE 1 TABLET EVERY DAY  rosuvastatin  (CRESTOR ) 5 MG tablet TAKE ONE TABLET BY MOUTH EVERY MONDAY, WEDNESDAY AND FRIDAY   traMADol  (ULTRAM ) 50 MG tablet Take 1 tablet (50 mg total) by mouth every 6 (six) hours as needed for severe pain.   triamterene -hydrochlorothiazide (MAXZIDE-25) 37.5-25 MG tablet TAKE 1 AND 1/2 TABLETS ONE TIME DAILY   UNABLE TO FIND Med Name: TDAP   [DISCONTINUED] oxyCODONE (OXY IR/ROXICODONE) 5 MG immediate release tablet Take by mouth.   No facility-administered encounter medications on file as of 10/11/2023.    Allergies (verified) Codeine, Influenza vaccines, and Oxycodone hcl   History: Past  Medical History:  Diagnosis Date   Allergy    Annual physical exam 08/04/2015   Annual visit for general adult medical examination with abnormal findings 01/24/2022   Arthritis    Cancer (HCC) 06/20/2014   MELANOMA, LEFT  LOWER ARM   Educated about COVID-19 virus infection 07/01/2018   Essential hypertension, benign 2009   Hypertension    Phreesia 09/09/2019   KNEE PAIN, RIGHT 09/13/2008   Qualifier: Diagnosis of  By: Charlsie LPN, Brandi   Qualifier: Diagnosis of  By: Antonetta MD, Margaret     Metabolic syndrome X 03/23/2011   Morbid obesity (HCC)    Need for influenza vaccination 11/27/2018   Rhinosinusitis    Past Surgical History:  Procedure Laterality Date   ABDOMINAL HYSTERECTOMY     Partial    JOINT REPLACEMENT  05/13/2011   Revision of left knee replacement in 2008   MOLE REMOVAL  06/20/2014   Dr Toma   Partial hysterectomy ,secondary to fibroid . Pt still have both ovaries  02/16/1976   Rt. forearm surgery for nerve repair  02/16/1987   TOTAL KNEE ARTHROPLASTY  02/15/2005   Right    TUBAL LIGATION  5/?/1980   Family History  Problem Relation Age of Onset   Heart failure Mother    Hypertension Mother    Heart disease Mother        Irregular heart valve   Miscarriages / Stillbirths Mother    Obesity Mother    Arthritis Mother    Hypertension Father    Emphysema Father    Heart disease Father    COPD Father    Arthritis Father    Diabetes Brother    Heart failure Brother    Diabetes Brother    Diabetes Brother    Hyperlipidemia Brother    Hypertension Sister    Hypertension Sister    Social History   Socioeconomic History   Marital status: Divorced    Spouse name: Not on file   Number of children: 3   Years of education: Not on file   Highest education level: 12th grade  Occupational History   Occupation: Disabled   Tobacco Use   Smoking status: Never   Smokeless tobacco: Never  Substance and Sexual Activity   Alcohol use: Never   Drug use: No    Sexual activity: Not Currently    Birth control/protection: None  Other Topics Concern   Not on file  Social History Narrative   Not on file   Social Drivers of Health   Financial Resource Strain: Low Risk  (10/11/2023)   Overall Financial Resource Strain (CARDIA)    Difficulty of Paying Living Expenses: Not hard at all  Food Insecurity: No Food Insecurity (10/11/2023)   Hunger Vital Sign    Worried About Running Out of Food in the Last Year: Never true    Ran Out of Food in the  Last Year: Never true  Transportation Needs: No Transportation Needs (10/11/2023)   PRAPARE - Administrator, Civil Service (Medical): No    Lack of Transportation (Non-Medical): No  Physical Activity: Insufficiently Active (10/11/2023)   Exercise Vital Sign    Days of Exercise per Week: 2 days    Minutes of Exercise per Session: 60 min  Stress: No Stress Concern Present (10/11/2023)   [employer] of Occupational Health - Occupational Stress Questionnaire    Feeling of Stress: Not at all  Social Connections: Moderately Integrated (10/11/2023)   Social Connection and Isolation Panel    Frequency of Communication with Friends and Family: More than three times a week    Frequency of Social Gatherings with Friends and Family: More than three times a week    Attends Religious Services: More than 4 times per year    Active Member of [employer] or Organizations: Yes    Attends Engineer, structural: More than 4 times per year    Marital Status: Divorced    Tobacco Counseling Counseling given: Yes    Clinical Intake:  Pre-visit preparation completed: Yes  Pain : 0-10 Pain Score: 5  Pain Type: Chronic pain Pain Location: Knee Pain Orientation: Right Pain Descriptors / Indicators: Constant Pain Onset: More than a month ago Pain Frequency: Constant     BMI - recorded: 44.79 Nutritional Status: BMI > 30  Obese Nutritional Risks: None Diabetes: No  Lab Results  Component  Value Date   HGBA1C 5.5 10/20/2021   HGBA1C 5.3 01/12/2021   HGBA1C 5.3 07/12/2019     How often do you need to have someone help you when you read instructions, pamphlets, or other written materials from your doctor or pharmacy?: 1 - Never  Interpreter Needed?: No  Information entered by :: Jontrell Bushong W CMA (AAMA)   Activities of Daily Living     10/10/2023   11:10 AM  In your present state of health, do you have any difficulty performing the following activities:  Hearing? 0  Vision? 0  Difficulty concentrating or making decisions? 0  Walking or climbing stairs? 1  Dressing or bathing? 0  Doing errands, shopping? 0  Preparing Food and eating ? N  Using the Toilet? N  In the past six months, have you accidently leaked urine? N  Do you have problems with loss of bowel control? N  Managing your Medications? N  Managing your Finances? N  Housekeeping or managing your Housekeeping? N    Patient Care Team: Antonetta Rollene BRAVO, MD as PCP - General Debera Jayson MATSU, MD as Consulting Physician (Cardiology) Patty, A. Robynn, MD (Ophthalmology)  I have updated your Care Teams any recent Medical Services you may have received from other providers in the past year.     Assessment:   This is a routine wellness examination for [place].  Hearing/Vision screen Hearing Screening - Comments:: Patient denies any hearing difficulties.   Vision Screening - Comments:: Wears rx glasses - up to date with routine eye exams with  [facility] in [place]   Goals Addressed             This Visit's Progress    Patient Stated   On track    Continue to lose weight so that I can get my knee replaced.         Depression Screen     10/11/2023   10:08 AM 10/07/2022    8:51 AM 08/05/2022  2:50 PM 04/27/2022   10:26 AM 02/18/2022    3:46 PM 02/04/2022    8:35 AM 01/22/2022   10:30 AM  PHQ 2/9 Scores  PHQ - 2 Score 0 0 0 0 0 0 1  PHQ- 9 Score 0  2 2       Fall Risk     10/10/2023    11:10 AM 10/06/2022    7:18 PM 09/20/2022    9:22 AM 08/05/2022    2:50 PM 04/27/2022   10:25 AM  Fall Risk   Falls in the past year? 0 1 1 1  0  Number falls in past yr: 0 1 1 1  0  Injury with Fall? 0 1 1 1  0  Risk for fall due to : Impaired balance/gait;Impaired mobility;Orthopedic patient History of fall(s);Impaired balance/gait;Impaired mobility;Orthopedic patient  History of fall(s);Impaired balance/gait Impaired balance/gait  Follow up Falls evaluation completed;Education provided;Falls prevention discussed Education provided;Falls prevention discussed  Falls evaluation completed Falls evaluation completed    MEDICARE RISK AT HOME:  Medicare Risk at Home Any stairs in or around the home?: (Patient-Rptd) Yes If so, are there any without handrails?: (Patient-Rptd) Yes Home free of loose throw rugs in walkways, pet beds, electrical cords, etc?: (Patient-Rptd) No Adequate lighting in your home to reduce risk of falls?: (Patient-Rptd) Yes Life alert?: (Patient-Rptd) No Use of a cane, walker or w/c?: (Patient-Rptd) Yes Grab bars in the bathroom?: (Patient-Rptd) No Shower chair or bench in shower?: (Patient-Rptd) No Elevated toilet seat or a handicapped toilet?: (Patient-Rptd) No  TIMED UP AND GO:  Was the test performed?  No  Cognitive Function: 6CIT completed        10/11/2023   10:08 AM 10/07/2022    8:50 AM 09/14/2021   10:54 AM 09/12/2020   11:25 AM 09/11/2019    2:47 PM  6CIT Screen  What Year? 0 points 0 points 0 points 0 points 0 points  What month? 0 points 0 points 0 points 0 points 0 points  What time? 0 points 0 points 0 points 0 points 0 points  Count back from 20 0 points 0 points 0 points 0 points 0 points  Months in reverse 0 points 0 points 0 points 0 points 0 points  Repeat phrase 0 points 0 points 0 points 0 points 4 points  Total Score 0 points 0 points 0 points 0 points 4 points    Immunizations Immunization History  Administered Date(s) Administered    Fluad Quad(high Dose 65+) 11/27/2018, 12/04/2019, 12/16/2020, 01/20/2022   Influenza Whole 11/23/2006   Moderna Sars-Covid-2 Vaccination 05/15/2019, 06/15/2019, 12/25/2019, 12/26/2020   Pneumococcal Conjugate-13 10/31/2013   Pneumococcal Polysaccharide-23 08/04/2015   Td 11/23/2006   Zoster Recombinant(Shingrix) 04/17/2021, 10/16/2021   Zoster, Live 02/13/2009    Screening Tests Health Maintenance  Topic Date Due   DTaP/Tdap/Td (2 - Tdap) 11/22/2016   DEXA SCAN  01/29/2022   COVID-19 Vaccine (5 - 2024-25 season) 10/17/2022   MAMMOGRAM  02/12/2023   INFLUENZA VACCINE  09/16/2023   Medicare Annual Wellness (AWV)  10/10/2024   Fecal DNA (Cologuard)  02/23/2025   Pneumococcal Vaccine: 50+ Years  Completed   Hepatitis C Screening  Completed   Zoster Vaccines- Shingrix  Completed   HPV VACCINES  Aged Out   Meningococcal B Vaccine  Aged Out   Colonoscopy  Discontinued    Health Maintenance  Health Maintenance Due  Topic Date Due   DTaP/Tdap/Td (2 - Tdap) 11/22/2016   DEXA SCAN  01/29/2022   COVID-19  Vaccine (5 - 2024-25 season) 10/17/2022   MAMMOGRAM  02/12/2023   INFLUENZA VACCINE  09/16/2023   Health Maintenance Items Addressed: Mammogram ordered, DEXA ordered  Additional Screening:  Vision Screening: Recommended annual ophthalmology exams for early detection of glaucoma and other disorders of the eye. Would you like a referral to an eye doctor? No    Dental Screening: Recommended annual dental exams for proper oral hygiene  Community Resource Referral / Chronic Care Management: CRR required this visit?  No   CCM required this visit?  No   Plan:    I have personally reviewed and noted the following in the patient's chart:   Medical and social history Use of alcohol, tobacco or illicit drugs  Current medications and supplements including opioid prescriptions. Patient is not currently taking opioid prescriptions. Functional ability and status Nutritional  status Physical activity Advanced directives List of other physicians Hospitalizations, surgeries, and ER visits in previous 12 months Vitals Screenings to include cognitive, depression, and falls Referrals and appointments  In addition, I have reviewed and discussed with patient certain preventive protocols, quality metrics, and best practice recommendations. A written personalized care plan for preventive services as well as general preventive health recommendations were provided to patient.   Lula Kolton, CMA   10/11/2023   After Visit Summary: (MyChart) Due to this being a telephonic visit, the after visit summary with patients personalized plan was offered to patient via MyChart   Notes: Nothing significant to report at this time.

## 2023-10-11 NOTE — Patient Instructions (Signed)
 Ms. Hoefling , Thank you for taking time out of your busy schedule to complete your Annual Wellness Visit with me. I enjoyed our conversation and look forward to speaking with you again next year. I, as well as your care team,  appreciate your ongoing commitment to your health goals. Please review the following plan we discussed and let me know if I can assist you in the future.  Your Game plan/ To Do List  Referrals/Orders: If you haven't heard from the office you've been referred to, please reach out to them at the phone provided.   Osteoporosis Screening and/or Yearly Mammogram at [facility]: Please call the number below to schedule your appt. [facility] Phone: [phone]  Follow up Visits: We will see or speak with you next year for your Next Medicare AWV with our clinical staff  Clinician Recommendations:  Aim for 30 minutes of exercise or brisk walking, 6-8 glasses of water, and 5 servings of fruits and vegetables each day.    Wishing you many blessings and good health during the next year until our next visit.  -Diquan Kassis   This is a list of the screenings recommended for you:  Health Maintenance  Topic Date Due   DTaP/Tdap/Td vaccine (2 - Tdap) 11/22/2016   DEXA scan (bone density measurement)  01/29/2022   COVID-19 Vaccine (5 - 2024-25 season) 10/17/2022   Mammogram  02/12/2023   Flu Shot  09/16/2023   Medicare Annual Wellness Visit  10/10/2024   Cologuard (Stool DNA test)  02/23/2025   Pneumococcal Vaccine for age over 63  Completed   Hepatitis C Screening  Completed   Zoster (Shingles) Vaccine  Completed   HPV Vaccine  Aged Out   Meningitis B Vaccine  Aged Out   Colon Cancer Screening  Discontinued    Advanced directives: (Declined) Advance directive discussed with you today. Even though you declined this today, please call our office should you change your mind, and we can give you the proper paperwork for you to fill out. Advance Care Planning  is important because it:  [x]  Makes sure you receive the medical care that is consistent with your values, goals, and preferences  [x]  It provides guidance to your family and loved ones and reduces their decisional burden about whether or not they are making the right decisions based on your wishes.  Follow the link provided in your after visit summary or read over the paperwork we have mailed to you to help you started getting your Advance Directives in place. If you need assistance in completing these, please reach out to us  so that we can help you!  Information on Advanced Care Planning can be found at   Secretary of [facility] Advance Health Care Directives Advance Health Care Directives ([email or web address]   See attachments for Preventive Care and Fall Prevention Tips.

## 2023-10-18 ENCOUNTER — Other Ambulatory Visit: Payer: Self-pay | Admitting: Family Medicine

## 2023-11-04 ENCOUNTER — Ambulatory Visit (HOSPITAL_COMMUNITY)
Admission: RE | Admit: 2023-11-04 | Discharge: 2023-11-04 | Disposition: A | Discharge status: Home / Self Care | Payer: PRIVATE HEALTH INSURANCE | Source: Ambulatory Visit | Attending: Family Medicine | Admitting: Family Medicine

## 2023-11-04 ENCOUNTER — Encounter (HOSPITAL_COMMUNITY): Payer: Self-pay

## 2023-11-04 ENCOUNTER — Ambulatory Visit (HOSPITAL_COMMUNITY)
Admission: RE | Admit: 2023-11-04 | Discharge: 2023-11-04 | Disposition: A | Discharge status: Home / Self Care | Source: Ambulatory Visit | Attending: Family Medicine | Admitting: Family Medicine

## 2023-11-04 DIAGNOSIS — Z1231 Encounter for screening mammogram for malignant neoplasm of breast: Secondary | ICD-10-CM | POA: Insufficient documentation

## 2023-11-04 DIAGNOSIS — M8589 Other specified disorders of bone density and structure, multiple sites: Secondary | ICD-10-CM | POA: Diagnosis not present

## 2023-11-04 DIAGNOSIS — Z780 Asymptomatic menopausal state: Secondary | ICD-10-CM | POA: Insufficient documentation

## 2023-11-22 DIAGNOSIS — E876 Hypokalemia: Secondary | ICD-10-CM | POA: Diagnosis not present

## 2023-11-22 DIAGNOSIS — E785 Hyperlipidemia, unspecified: Secondary | ICD-10-CM | POA: Diagnosis not present

## 2023-11-22 DIAGNOSIS — Z6841 Body Mass Index (BMI) 40.0 and over, adult: Secondary | ICD-10-CM | POA: Diagnosis not present

## 2023-11-22 DIAGNOSIS — Z885 Allergy status to narcotic agent status: Secondary | ICD-10-CM | POA: Diagnosis not present

## 2023-11-22 DIAGNOSIS — Z9181 History of falling: Secondary | ICD-10-CM | POA: Diagnosis not present

## 2023-11-22 DIAGNOSIS — Z85828 Personal history of other malignant neoplasm of skin: Secondary | ICD-10-CM | POA: Diagnosis not present

## 2023-11-22 DIAGNOSIS — M1990 Unspecified osteoarthritis, unspecified site: Secondary | ICD-10-CM | POA: Diagnosis not present

## 2023-11-22 DIAGNOSIS — I10 Essential (primary) hypertension: Secondary | ICD-10-CM | POA: Diagnosis not present

## 2023-12-02 ENCOUNTER — Ambulatory Visit: Payer: Self-pay

## 2023-12-14 ENCOUNTER — Other Ambulatory Visit: Payer: Self-pay | Admitting: Family Medicine

## 2024-01-17 ENCOUNTER — Other Ambulatory Visit: Payer: Self-pay

## 2024-01-17 DIAGNOSIS — E559 Vitamin D deficiency, unspecified: Secondary | ICD-10-CM

## 2024-01-17 DIAGNOSIS — E785 Hyperlipidemia, unspecified: Secondary | ICD-10-CM

## 2024-01-17 DIAGNOSIS — I10 Essential (primary) hypertension: Secondary | ICD-10-CM

## 2024-01-18 ENCOUNTER — Other Ambulatory Visit: Payer: Self-pay | Admitting: Family Medicine

## 2024-01-25 ENCOUNTER — Other Ambulatory Visit: Payer: Self-pay | Admitting: Family Medicine

## 2024-02-08 LAB — CBC WITH DIFFERENTIAL/PLATELET
Basophils Absolute: 0 x10E3/uL (ref 0.0–0.2)
Basos: 0 %
EOS (ABSOLUTE): 0.1 x10E3/uL (ref 0.0–0.4)
Eos: 2 %
Hematocrit: 34.6 % (ref 34.0–46.6)
Hemoglobin: 11.1 g/dL (ref 11.1–15.9)
Immature Grans (Abs): 0 x10E3/uL (ref 0.0–0.1)
Immature Granulocytes: 0 %
Lymphocytes Absolute: 1.5 x10E3/uL (ref 0.7–3.1)
Lymphs: 29 %
MCH: 28.8 pg (ref 26.6–33.0)
MCHC: 32.1 g/dL (ref 31.5–35.7)
MCV: 90 fL (ref 79–97)
Monocytes Absolute: 0.5 x10E3/uL (ref 0.1–0.9)
Monocytes: 9 %
Neutrophils Absolute: 3.1 x10E3/uL (ref 1.4–7.0)
Neutrophils: 60 %
Platelets: 211 x10E3/uL (ref 150–450)
RBC: 3.85 x10E6/uL (ref 3.77–5.28)
RDW: 13.4 % (ref 11.7–15.4)
WBC: 5.2 x10E3/uL (ref 3.4–10.8)

## 2024-02-08 LAB — LIPID PANEL
Chol/HDL Ratio: 3.7 ratio (ref 0.0–4.4)
Cholesterol, Total: 179 mg/dL (ref 100–199)
HDL: 48 mg/dL
LDL Chol Calc (NIH): 114 mg/dL — ABNORMAL HIGH (ref 0–99)
Triglycerides: 91 mg/dL (ref 0–149)
VLDL Cholesterol Cal: 17 mg/dL (ref 5–40)

## 2024-02-08 LAB — CMP14+EGFR
ALT: 11 IU/L (ref 0–32)
AST: 17 IU/L (ref 0–40)
Albumin: 4.2 g/dL (ref 3.8–4.8)
Alkaline Phosphatase: 102 IU/L (ref 49–135)
BUN/Creatinine Ratio: 16 (ref 12–28)
BUN: 16 mg/dL (ref 8–27)
Bilirubin Total: 0.5 mg/dL (ref 0.0–1.2)
CO2: 26 mmol/L (ref 20–29)
Calcium: 9.4 mg/dL (ref 8.7–10.3)
Chloride: 100 mmol/L (ref 96–106)
Creatinine, Ser: 0.97 mg/dL (ref 0.57–1.00)
Globulin, Total: 2.7 g/dL (ref 1.5–4.5)
Glucose: 78 mg/dL (ref 70–99)
Potassium: 3.9 mmol/L (ref 3.5–5.2)
Sodium: 138 mmol/L (ref 134–144)
Total Protein: 6.9 g/dL (ref 6.0–8.5)
eGFR: 61 mL/min/1.73

## 2024-02-08 LAB — TSH: TSH: 1.67 u[IU]/mL (ref 0.450–4.500)

## 2024-02-08 LAB — VITAMIN D 25 HYDROXY (VIT D DEFICIENCY, FRACTURES): Vit D, 25-Hydroxy: 46.2 ng/mL (ref 30.0–100.0)

## 2024-02-14 ENCOUNTER — Encounter: Payer: Self-pay | Admitting: Family Medicine

## 2024-02-14 ENCOUNTER — Ambulatory Visit: Payer: PRIVATE HEALTH INSURANCE | Admitting: Family Medicine

## 2024-02-14 VITALS — BP 118/72 | HR 84 | Resp 16 | Ht 67.0 in | Wt 274.1 lb

## 2024-02-14 DIAGNOSIS — Z0001 Encounter for general adult medical examination with abnormal findings: Secondary | ICD-10-CM | POA: Diagnosis not present

## 2024-02-14 DIAGNOSIS — E785 Hyperlipidemia, unspecified: Secondary | ICD-10-CM

## 2024-02-14 DIAGNOSIS — Z6841 Body Mass Index (BMI) 40.0 and over, adult: Secondary | ICD-10-CM

## 2024-02-14 DIAGNOSIS — Z23 Encounter for immunization: Secondary | ICD-10-CM | POA: Insufficient documentation

## 2024-02-14 DIAGNOSIS — I10 Essential (primary) hypertension: Secondary | ICD-10-CM | POA: Diagnosis not present

## 2024-02-14 DIAGNOSIS — Z9189 Other specified personal risk factors, not elsewhere classified: Secondary | ICD-10-CM | POA: Insufficient documentation

## 2024-02-14 MED ORDER — POTASSIUM CHLORIDE CRYS ER 20 MEQ PO TBCR: 20.0000 meq | EXTENDED_RELEASE_TABLET | Freq: Every day | ORAL | 3 refills | Status: AC

## 2024-02-14 MED ORDER — TRIAMTERENE-HCTZ 75-50 MG PO TABS: 1.0000 | ORAL_TABLET | Freq: Every day | ORAL | 3 refills | Status: AC

## 2024-02-14 MED ORDER — ROSUVASTATIN CALCIUM 5 MG PO TABS: ORAL_TABLET | ORAL | 3 refills | Status: AC

## 2024-02-14 MED ORDER — TRAMADOL HCL 50 MG PO TABS: ORAL_TABLET | ORAL | 0 refills | Status: AC

## 2024-02-14 NOTE — Patient Instructions (Addendum)
 F/U in 6 months, EKG at visit  Fasting lipid, cmp and EGFR 1 week before June follow up  In March 2024 you weighed 298 pounds, today you weight 274 pounds, congratulations!  Flu vaccine today  Please get covid vaccine and TdAP at your pharmacy in next 2 weeks   Increase rosuvastatin  to taking it every day and reduce fried and fatty foods , bad cholesterol has increased slightly  Labs are EXCELLENT , normal bone marrow, kidney and thyroid  and liver function and normal Vit D  Maxizide is changed to the higher dose tablet take ONE daily  You are referred for scan of heart vessels as we discussed   Limited tramadol  is sent to [facility]  All the best for 2026, happy 73!  Next cologuard in 2027

## 2024-02-14 NOTE — Progress Notes (Signed)
" ° ° ° ° ° °  Michele Lowe     MRN: [id number]      DOB: 11/24/1950  Chief Complaint  Patient presents with   Hypertension    Follow up    Annual Exam    HPI: Patient is in for annual physical exam. No other health concerns are expressed or addressed at the visit. Recent labs,  are reviewed.crestor  increased  Immunization is reviewed , and  updated   PE: BP 118/72   Pulse 84   Resp 16   Ht 5' 7 (1.702 m)   Wt 274 lb 1.3 oz (124.3 kg)   SpO2 98%   BMI 42.93 kg/m   Pleasant  female, alert and oriented x 3, in no cardio-pulmonary distress. Afebrile. HEENT No facial trauma or asymetry. Sinuses non tender.  Extra occullar muscles intact.. External ears normal, . Neck: supple, no adenopathy,JVD or thyromegaly.No bruits.  Chest: Clear to ascultation bilaterally.No crackles or wheezes. Non tender to palpation  Breast: Normal mammogram 2025 and asymptomatic  Cardiovascular system; Heart sounds normal,  S1 and  S2 ,no S3.  No murmur, or thrill. Peripheral pulses normal.  Abdomen: Soft, non tender,     Musculoskeletal exam: Decreased though adequate  ROM of spine, hips , shoulders and knees.  deformity ,swelling and  crepitus noted.in right knee tender over lateral aspect No muscle wasting or atrophy.   Neurologic: Cranial nerves 2 to 12 intact. Power, tone ,sensation  normal throughout. MIld  disturbance in gait. No tremor.  Skin: Intact, no ulceration, erythema , scaling or rash noted. Pigmentation normal throughout  Psych; Normal mood and affect. Judgement and concentration normal   Assessment & Plan:  Encounter for Medicare annual examination with abnormal findings Annual exam as documented. Counseling done  re healthy lifestyle involving commitment to 150 minutes exercise per week, heart healthy diet, and attaining healthy  Immunization and cancer screening needs are specifically addressed at this visit.   At increased risk for cardiovascular  disease Healthy lifestyle and control of chronic conditions discussed and encouraged. Refer for CT cardiac scoring. Already on a statin  MORBID OBESITY  Patient re-educated about  the importance of commitment to a  minimum of 150 minutes of exercise per week as able.  The importance of healthy food choices with portion control discussed, as well as eating regularly and within a 12 hour window most days. The need to choose clean , green food 50 to 75% of the time is discussed, as well as to make water the primary drink and set a goal of 64 ounces water daily.       02/14/2024    9:21 AM 10/11/2023   10:03 AM 10/07/2022    8:42 AM  Weight /BMI  Weight 274 lb 1.3 oz 286 lb 297 lb  Height 5' 7 (1.702 m) 5' 7 (1.702 m) 5' 7 (1.702 m)  BMI 42.93 kg/m2 44.79 kg/m2 46.52 kg/m2    Excellent weight loss in past 1 year with change in diet primarily, exercises once weekly currently, encouraged to inc same  Immunization due After obtaining informed consent, the influenza  vaccine is  administered , with no adverse effect noted at the time of administration.  "

## 2024-02-14 NOTE — Assessment & Plan Note (Signed)
 Annual exam as documented. Counseling done  re healthy lifestyle involving commitment to 150 minutes exercise per week, heart healthy diet, and attaining healthy  Immunization and cancer screening needs are specifically addressed at this visit.

## 2024-02-14 NOTE — Assessment & Plan Note (Signed)
" °  Patient re-educated about  the importance of commitment to a  minimum of 150 minutes of exercise per week as able.  The importance of healthy food choices with portion control discussed, as well as eating regularly and within a 12 hour window most days. The need to choose clean , green food 50 to 75% of the time is discussed, as well as to make water the primary drink and set a goal of 64 ounces water daily.       02/14/2024    9:21 AM 10/11/2023   10:03 AM 10/07/2022    8:42 AM  Weight /BMI  Weight 274 lb 1.3 oz 286 lb 297 lb  Height 5' 7 (1.702 m) 5' 7 (1.702 m) 5' 7 (1.702 m)  BMI 42.93 kg/m2 44.79 kg/m2 46.52 kg/m2    Excellent weight loss in past 1 year with change in diet primarily, exercises once weekly currently, encouraged to inc same "

## 2024-02-14 NOTE — Assessment & Plan Note (Signed)
 Healthy lifestyle and control of chronic conditions discussed and encouraged. Refer for CT cardiac scoring. Already on a statin

## 2024-02-14 NOTE — Assessment & Plan Note (Signed)
 After obtaining informed consent, the influenza vaccine is  administered , with no adverse effect noted at the time of administration.

## 2024-02-29 ENCOUNTER — Ambulatory Visit (INDEPENDENT_AMBULATORY_CARE_PROVIDER_SITE_OTHER): Payer: PRIVATE HEALTH INSURANCE | Admitting: Orthopedic Surgery

## 2024-02-29 ENCOUNTER — Ambulatory Visit: Payer: PRIVATE HEALTH INSURANCE

## 2024-02-29 ENCOUNTER — Encounter: Payer: Self-pay | Admitting: Orthopedic Surgery

## 2024-02-29 VITALS — BP 144/71 | HR 88 | Ht 64.5 in | Wt 264.0 lb

## 2024-02-29 DIAGNOSIS — Z6841 Body Mass Index (BMI) 40.0 and over, adult: Secondary | ICD-10-CM | POA: Diagnosis not present

## 2024-02-29 DIAGNOSIS — M1711 Unilateral primary osteoarthritis, right knee: Secondary | ICD-10-CM

## 2024-02-29 NOTE — Progress Notes (Signed)
" ° ° °  02/29/2024   Chief Complaint  Patient presents with   Knee Pain    Right/ wants to discuss surgery has lost 35-40 lbs     No diagnosis found.  What pharmacy do you use ? ____[facility]  [place]_______  DOI/DOS/ Date:    Did you get better, worse or no change (Answer below)   The patient reports decreased pain after losing weight  BP (!) 144/71   Pulse 88   Ht 5' 4.5 (1.638 m)   Wt 264 lb (119.7 kg)   BMI 44.62 kg/m    BMI 44  "

## 2024-02-29 NOTE — Progress Notes (Addendum)
 Patient ID: Michele Lowe, female   DOB: Aug 05, 1950, 74 y.o.   MRN: [id number]  FOLLOW-UP OFFICE VISIT   Patient: Michele Lowe           Date of Birth: 1950/06/04           MRN: [id number] Visit Date: 02/29/2024 Requested by: Antonetta Rollene BRAVO, MD [address] [place],  [place] [address] PCP: Antonetta Rollene BRAVO, MD    Encounter Diagnoses  Name Primary?   Primary osteoarthritis of right knee Yes   Body mass index 40.0-44.9, adult (HCC)    Morbid obesity (HCC)     Chief Complaint  Patient presents with   Knee Pain    Right/ wants to discuss surgery has lost 35-74 lbs    74 year old female with osteoarthritis right knee BMI over 45 has lost 30 or 40 pounds with diet control  She complains of lateral compartment pain decreased pain after she lost some of the weight  She currently uses Tylenol  arthritis once a day plus tramadol  recently and use topical medications and ice  She would like to discuss total knee arthroplasty in the setting of her weight loss  The right knee is in valgus 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 she has lateral compartment pain decreased range of motion is noted  Otherwise stable knee   Image shows a valgus knee moderate greater than 10 degrees with significant joint space narrowing multiple osteophytes and severe disease  DG Knee AP/LAT W/Sunrise Right Result Date: 02/29/2024 Image DG knee AP lateral with sunrise Right knee Chief complaint right knee pain Diagnosis osteoarthritis right knee Alignment valgus angle is 10 degrees Joint space narrowing laterally which appears to exhibit bone-on-bone with subchondral cyst formation and peripheral osteophytes, the osteophytes also involve the patellofemoral joint medial compartment and posterior compartment Impression severe arthritis right knee worse in the lateral compartment and patellofemoral compartment with secondary bone changes Note: The left knee had a revision with a medium stem extension  on the tibia and it shows that the stem is bordering on the lateral cortex of the tibia however, this is asymptomatic     ASSESSMENT AND PLAN Osteoarthritis right knee in the setting of a BMI of 44  Recommend goal weight of 255  Continue Tylenol  arthritis, tramadol  and topical medications as she is doing.  Discussed with primary care more aggressive methods for weight loss  Return at goal weight to discuss surgical intervention  Note the patient has a large thigh and this surgery will require extra assistants to help manage the leg.  There may be some issues getting a tourniquet around the thigh.  I expect difficult and longer than expected tourniquet time and surgical time

## 2024-03-19 ENCOUNTER — Encounter (HOSPITAL_COMMUNITY): Payer: Self-pay

## 2024-03-19 ENCOUNTER — Ambulatory Visit (HOSPITAL_COMMUNITY)
Admission: RE | Admit: 2024-03-19 | Discharge: 2024-03-19 | Disposition: A | Payer: PRIVATE HEALTH INSURANCE | Source: Ambulatory Visit | Attending: Family Medicine | Admitting: Family Medicine

## 2024-03-19 DIAGNOSIS — Z9189 Other specified personal risk factors, not elsewhere classified: Secondary | ICD-10-CM

## 2024-03-19 DIAGNOSIS — E785 Hyperlipidemia, unspecified: Secondary | ICD-10-CM

## 2024-03-19 DIAGNOSIS — I10 Essential (primary) hypertension: Secondary | ICD-10-CM

## 2024-08-14 ENCOUNTER — Ambulatory Visit: Payer: Self-pay | Admitting: Family Medicine

## 2024-10-15 ENCOUNTER — Ambulatory Visit
